# Patient Record
Sex: Female | Born: 1960 | Race: White | Hispanic: No | Smoking: Current every day smoker
Health system: Southern US, Community
[De-identification: ages and names within clinical notes are randomized; demographics above are authoritative.]

## PROBLEM LIST (undated history)

## (undated) DIAGNOSIS — E785 Hyperlipidemia, unspecified: Secondary | ICD-10-CM

## (undated) DIAGNOSIS — F329 Major depressive disorder, single episode, unspecified: Secondary | ICD-10-CM

## (undated) DIAGNOSIS — I2510 Atherosclerotic heart disease of native coronary artery without angina pectoris: Secondary | ICD-10-CM

## (undated) DIAGNOSIS — I219 Acute myocardial infarction, unspecified: Secondary | ICD-10-CM

## (undated) DIAGNOSIS — F32A Depression, unspecified: Secondary | ICD-10-CM

## (undated) DIAGNOSIS — M1990 Unspecified osteoarthritis, unspecified site: Secondary | ICD-10-CM

## (undated) DIAGNOSIS — L0231 Cutaneous abscess of buttock: Secondary | ICD-10-CM

## (undated) DIAGNOSIS — I10 Essential (primary) hypertension: Secondary | ICD-10-CM

## (undated) HISTORY — DX: Cutaneous abscess of buttock: L02.31

## (undated) HISTORY — DX: Atherosclerotic heart disease of native coronary artery without angina pectoris: I25.10

## (undated) HISTORY — DX: Depression, unspecified: F32.A

## (undated) HISTORY — DX: Major depressive disorder, single episode, unspecified: F32.9

## (undated) HISTORY — PX: RECTAL SURGERY: SHX760

## (undated) HISTORY — DX: Essential (primary) hypertension: I10

## (undated) HISTORY — DX: Acute myocardial infarction, unspecified: I21.9

## (undated) HISTORY — DX: Hyperlipidemia, unspecified: E78.5

## (undated) HISTORY — DX: Unspecified osteoarthritis, unspecified site: M19.90

## (undated) HISTORY — PX: CORONARY ANGIOPLASTY: SHX604

---

## 2005-06-05 ENCOUNTER — Emergency Department: Payer: Self-pay | Admitting: Emergency Medicine

## 2005-06-09 ENCOUNTER — Ambulatory Visit: Payer: Self-pay | Admitting: General Surgery

## 2007-07-11 ENCOUNTER — Ambulatory Visit: Payer: Self-pay | Admitting: Gastroenterology

## 2010-10-20 ENCOUNTER — Ambulatory Visit: Payer: Self-pay | Admitting: Unknown Physician Specialty

## 2012-11-17 ENCOUNTER — Emergency Department: Payer: Self-pay | Admitting: Emergency Medicine

## 2013-03-09 DIAGNOSIS — I219 Acute myocardial infarction, unspecified: Secondary | ICD-10-CM

## 2013-03-09 HISTORY — DX: Acute myocardial infarction, unspecified: I21.9

## 2013-03-29 ENCOUNTER — Emergency Department: Payer: Self-pay | Admitting: Emergency Medicine

## 2013-03-29 LAB — CK TOTAL AND CKMB (NOT AT ARMC): CK-MB: 2.9 ng/mL (ref 0.5–3.6)

## 2013-03-29 LAB — BASIC METABOLIC PANEL
Anion Gap: 6 — ABNORMAL LOW (ref 7–16)
BUN: 12 mg/dL (ref 7–18)
Creatinine: 0.93 mg/dL (ref 0.60–1.30)
EGFR (Non-African Amer.): >60
Potassium: 3.7 mmol/L (ref 3.5–5.1)
Sodium: 142 mmol/L (ref 136–145)

## 2013-03-29 LAB — TROPONIN I: Troponin-I: 0.35 ng/mL — ABNORMAL HIGH

## 2013-03-29 LAB — CBC
HCT: 46.2 % (ref 35.0–47.0)
MCH: 34.1 pg — ABNORMAL HIGH (ref 26.0–34.0)
MCHC: 34.5 g/dL (ref 32.0–36.0)
MCV: 99 fL (ref 80–100)
Platelet: 247 10*3/uL (ref 150–440)
RBC: 4.69 10*6/uL (ref 3.80–5.20)
RDW: 13.2 % (ref 11.5–14.5)
WBC: 10.1 10*3/uL (ref 3.6–11.0)

## 2013-03-29 LAB — PROTIME-INR: INR: 0.9

## 2013-03-29 LAB — APTT: Activated PTT: 25.2 secs (ref 23.6–35.9)

## 2013-03-30 HISTORY — PX: CARDIAC SURGERY: SHX584

## 2013-04-02 DIAGNOSIS — I213 ST elevation (STEMI) myocardial infarction of unspecified site: Secondary | ICD-10-CM | POA: Diagnosis present

## 2013-05-29 ENCOUNTER — Ambulatory Visit: Payer: Self-pay

## 2014-05-29 DIAGNOSIS — E782 Mixed hyperlipidemia: Secondary | ICD-10-CM | POA: Insufficient documentation

## 2014-05-29 DIAGNOSIS — I10 Essential (primary) hypertension: Secondary | ICD-10-CM | POA: Insufficient documentation

## 2014-05-29 DIAGNOSIS — I2510 Atherosclerotic heart disease of native coronary artery without angina pectoris: Secondary | ICD-10-CM | POA: Insufficient documentation

## 2014-07-02 ENCOUNTER — Ambulatory Visit: Payer: Self-pay | Admitting: Surgery

## 2014-07-02 LAB — CBC WITH DIFFERENTIAL/PLATELET
BASOS ABS: 0.1 10*3/uL (ref 0.0–0.1)
BASOS PCT: 0.8 %
EOS ABS: 0.2 10*3/uL (ref 0.0–0.7)
Eosinophil %: 2.3 %
HCT: 43.5 % (ref 35.0–47.0)
HGB: 14.6 g/dL (ref 12.0–16.0)
Lymphocyte #: 2.1 10*3/uL (ref 1.0–3.6)
Lymphocyte %: 28 %
MCH: 34 pg (ref 26.0–34.0)
MCHC: 33.5 g/dL (ref 32.0–36.0)
MCV: 102 fL — ABNORMAL HIGH (ref 80–100)
MONO ABS: 0.5 x10 3/mm (ref 0.2–0.9)
Monocyte %: 6.3 %
NEUTROS ABS: 4.8 10*3/uL (ref 1.4–6.5)
NEUTROS PCT: 62.6 %
PLATELETS: 203 10*3/uL (ref 150–440)
RBC: 4.28 10*6/uL (ref 3.80–5.20)
RDW: 12.4 % (ref 11.5–14.5)
WBC: 7.7 10*3/uL (ref 3.6–11.0)

## 2014-07-10 ENCOUNTER — Ambulatory Visit: Payer: Self-pay | Admitting: Surgery

## 2014-10-11 DIAGNOSIS — K603 Anal fistula, unspecified: Secondary | ICD-10-CM | POA: Insufficient documentation

## 2015-01-30 NOTE — Op Note (Signed)
PATIENT NAME:  Sylvia Wiggins, Sylvia Wiggins MR#:  [id number] DATE OF BIRTH:  [birth year]  DATE OF PROCEDURE:  07/10/2014  PREOPERATIVE DIAGNOSIS: Anal fistula.  POSTOPERATIVE DIAGNOSIS: Anal fistula.  PROCEDURE: Rectal exam under anesthesia.  SURGEON: Tiney Rougealph Ely, MD  ANESTHESIA: General.  OPERATIVE PROCEDURE: With the patient in the supine position, after induction of appropriate general anesthesia, the patient was placed in the lithotomy position, appropriately padded and positioned. The perineal area was examined. At approximately the 3 o'clock position, on the left side, in lithotomy, there was a small nipple which was felt to be the external opening of the fistula. This area was probed gently and led approximately 4 cm toward the anal cavity. I manipulated the rectum both manually and with the bivalve retractor. We could not identify an internal opening. The probe did appear to track above the sphincter. I injected the external opening with some hydrogen peroxide in an effort to identify the internal tract if it were quite small but could not even with relatively forceful injection identify the internal opening. I decided at that point to abandoned the procedure because I had told her prior to surgery that we would not approach her sphincter if we could not identify an internal opening below the sphincter. We injected the perianal area with 0.25% Marcaine for postoperative pain control and the anus was dilated to 2 fingerbreadths. Sterile dressings were applied. The patient was returned to the recovery room having tolerated the procedure well. Sponge and needle counts were correct x2 in the operating room.  ____________________________ Quentin Orealph L. Ely III, MD rle:sb D: 07/10/2014 10:43:28 ET T: 07/10/2014 13:01:42 ET JOB#: [id number]  cc: Carmie Endalph L. Ely III, MD, <Dictator> Phillips Odorheryl A. Jamesetta OrleansWicker, NP Quentin OreALPH L ELY MD ELECTRONICALLY SIGNED 07/10/2014 17:54

## 2015-10-05 ENCOUNTER — Encounter: Payer: Self-pay | Admitting: Family Medicine

## 2015-10-05 ENCOUNTER — Ambulatory Visit (INDEPENDENT_AMBULATORY_CARE_PROVIDER_SITE_OTHER): Payer: Medicaid Other | Admitting: Family Medicine

## 2015-10-05 VITALS — BP 108/70 | HR 84 | Temp 97.8°F | Resp 15 | Ht 61.0 in | Wt 165.7 lb

## 2015-10-05 DIAGNOSIS — F321 Major depressive disorder, single episode, moderate: Secondary | ICD-10-CM

## 2015-10-05 DIAGNOSIS — F329 Major depressive disorder, single episode, unspecified: Secondary | ICD-10-CM | POA: Insufficient documentation

## 2015-10-05 MED ORDER — VENLAFAXINE HCL ER 75 MG PO CP24: 75.0000 mg | ORAL_CAPSULE | Freq: Every day | ORAL | Status: DC

## 2015-10-05 NOTE — Progress Notes (Signed)
Name: Sylvia Wiggins   MRN: [id number]    DOB: Aug 20, 1961   Date:10/05/2015       Progress Note  Subjective  Chief Complaint  Chief Complaint  Patient presents with  . Establish Care    NP  . Hyperlipidemia    Depression        This is a recurrent problem.  The onset quality is gradual.   The most recent episode lasted 2 months.  The problem is unchanged.  Associated symptoms include fatigue, helplessness, hopelessness, irritable and sad.  Associated symptoms include no decreased concentration and no appetite change.  Past treatments include SSRIs - Selective serotonin reuptake inhibitors (Started on Citalopram by Sylvia Wiggins for menopausal symptoms. Not helping with symptoms of depression.).  Compliance with treatment is good.   Pt. Is here to establish care.   Past Medical History  Diagnosis Date  . Hyperlipidemia   . Arthritis   . Hypertension   . Depression   . Myocardial infarction Sylvia [facility]ins) June 2014  . CAD (coronary artery disease)     Stents in Clogged artery at [facility] June 2014  . Abscess, gluteal, left     Recurrent drainage, followed by [name].    Past Surgical History  Procedure Laterality Date  . Rectal surgery  Anal Fissure    Family History  Problem Relation Age of Onset  . Cancer Mother     colon  . Heart disease Father     Died of Heart Attack    Social History   Social History  . Marital Status: Married    Spouse Name: N/A  . Number of Children: N/A  . Years of Education: N/A   Occupational History  . Not on file.   Social History Main Topics  . Smoking status: Current Every Day Smoker -- 0.25 packs/day    Types: Cigarettes  . Smokeless tobacco: Never Used  . Alcohol Use: No  . Drug Use: No  . Sexual Activity: No   Other Topics Concern  . Not on file   Social History Narrative  . No narrative on file     Current outpatient prescriptions:  .  citalopram (CELEXA) 20 MG tablet, Take by mouth., Disp: , Rfl:  .  metoprolol tartrate  (LOPRESSOR) 25 MG tablet, Take by mouth., Disp: , Rfl:  .  senna-docusate (SENOKOT-S) 8.6-50 MG tablet, Take by mouth., Disp: , Rfl:  .  simvastatin (ZOCOR) 40 MG tablet, Take 40 mg by mouth daily., Disp: , Rfl:  .  venlafaxine XR (EFFEXOR-XR) 75 MG 24 hr capsule, Take 1 capsule (75 mg total) by mouth daily with breakfast., Disp: 30 capsule, Rfl: 2  Allergies  Allergen Reactions  . Penicillins Nausea Only    Review of Systems  Constitutional: Positive for fatigue. Negative for appetite change.  Psychiatric/Behavioral: Positive for depression. Negative for decreased concentration.    Objective  Filed Vitals:   10/05/15 1143  BP: 108/70  Pulse: 84  Temp: 97.8 F (36.6 C)  TempSrc: Oral  Resp: 15  Height:  (1.549 m)  Weight: 165 lb 11.2 oz (75.161 kg)  SpO2: 98%    Physical Exam  Constitutional: She is oriented to person, place, and time and well-developed, well-nourished, and in no distress. She is irritable.  HENT:  Head: Normocephalic and atraumatic.  Cardiovascular: Normal rate, regular rhythm and normal heart sounds.   No murmur heard. Pulmonary/Chest: Effort normal and breath sounds normal.  Neurological: She is alert and oriented to person, place,  and time.  Psychiatric: Mood, memory, affect and judgment normal.  Nursing note and vitals reviewed.   Assessment & Plan  1. Moderate single current episode of major depressive disorder (HCC) DC citalopram and start on Effexor XR 75 mg for treatment of major depression and vasomotor symptoms of menopause. - venlafaxine XR (EFFEXOR-XR) 75 MG 24 hr capsule; Take 1 capsule (75 mg total) by mouth daily with breakfast.  Dispense: 30 capsule; Refill: 2   [name] Asad A. Faylene KurtzShah [facility] 10/05/2015 8:11 PM

## 2016-01-10 ENCOUNTER — Ambulatory Visit
Admission: RE | Admit: 2016-01-10 | Discharge: 2016-01-10 | Disposition: A | Discharge status: Home / Self Care | Payer: Disability Insurance | Source: Ambulatory Visit | Attending: Thoracic Surgery | Admitting: Thoracic Surgery

## 2016-01-10 ENCOUNTER — Ambulatory Visit
Admission: RE | Admit: 2016-01-10 | Discharge: 2016-01-10 | Disposition: A | Discharge status: Home / Self Care | Payer: Disability Insurance | Source: Ambulatory Visit | Attending: *Deleted | Admitting: *Deleted

## 2016-01-10 ENCOUNTER — Other Ambulatory Visit: Payer: Self-pay | Admitting: *Deleted

## 2016-01-10 DIAGNOSIS — I700 Atherosclerosis of aorta: Secondary | ICD-10-CM | POA: Diagnosis not present

## 2016-01-10 DIAGNOSIS — G5700 Lesion of sciatic nerve, unspecified lower limb: Secondary | ICD-10-CM | POA: Insufficient documentation

## 2016-01-26 ENCOUNTER — Ambulatory Visit (INDEPENDENT_AMBULATORY_CARE_PROVIDER_SITE_OTHER): Payer: Medicaid Other | Admitting: Family Medicine

## 2016-01-26 ENCOUNTER — Encounter: Payer: Self-pay | Admitting: Family Medicine

## 2016-01-26 VITALS — BP 108/80 | HR 87 | Temp 97.9°F | Resp 17 | Ht 61.0 in | Wt 165.3 lb

## 2016-01-26 DIAGNOSIS — F329 Major depressive disorder, single episode, unspecified: Secondary | ICD-10-CM | POA: Diagnosis not present

## 2016-01-26 DIAGNOSIS — I10 Essential (primary) hypertension: Secondary | ICD-10-CM | POA: Diagnosis not present

## 2016-01-26 DIAGNOSIS — I2510 Atherosclerotic heart disease of native coronary artery without angina pectoris: Secondary | ICD-10-CM | POA: Diagnosis not present

## 2016-01-26 DIAGNOSIS — G8929 Other chronic pain: Secondary | ICD-10-CM | POA: Diagnosis not present

## 2016-01-26 DIAGNOSIS — E785 Hyperlipidemia, unspecified: Secondary | ICD-10-CM

## 2016-01-26 DIAGNOSIS — M5441 Lumbago with sciatica, right side: Secondary | ICD-10-CM

## 2016-01-26 DIAGNOSIS — F32A Depression, unspecified: Secondary | ICD-10-CM | POA: Insufficient documentation

## 2016-01-26 DIAGNOSIS — R251 Tremor, unspecified: Secondary | ICD-10-CM | POA: Insufficient documentation

## 2016-01-26 MED ORDER — METOPROLOL TARTRATE 25 MG PO TABS: 12.5000 mg | ORAL_TABLET | Freq: Two times a day (BID) | ORAL | Status: DC

## 2016-01-26 MED ORDER — SIMVASTATIN 40 MG PO TABS
40.0000 mg | ORAL_TABLET | Freq: Every day | ORAL | Status: DC
Start: 2016-01-26 — End: 2020-02-17

## 2016-01-26 MED ORDER — VENLAFAXINE HCL ER 75 MG PO CP24: 75.0000 mg | ORAL_CAPSULE | Freq: Every day | ORAL | Status: DC

## 2016-01-26 NOTE — Progress Notes (Signed)
Name: Sylvia Wiggins   MRN: [id number]    DOB: February 01, 1961   Date:01/26/2016       Progress Note  Subjective  Chief Complaint  Chief Complaint  Patient presents with  . Referral    neuro / ortho     Depression        This is a chronic problem.  The onset quality is gradual.   The problem has been gradually improving since onset.  Associated symptoms include fatigue.  Associated symptoms include no hopelessness, does not have insomnia, no myalgias, no headaches, not sad and no suicidal ideas.  Past treatments include SNRIs - Serotonin and norepinephrine reuptake inhibitors. Hypertension This is a chronic problem. The problem is controlled. Pertinent negatives include no chest pain, headaches, palpitations or shortness of breath. Past treatments include beta blockers.  Hyperlipidemia This is a chronic problem. Pertinent negatives include no chest pain, myalgias or shortness of breath. Current antihyperlipidemic treatment includes statins.    Pt. Is requesting referral to Neurology for evaluation of shakes in her hands. Noticed her hands starting to shake and jerk involuntarily and getting worse over the years. These shakes have no association to activity.   In addition, she is requesting referral to Orthopedics for low back pain and right knee pain. She is trying to obtain SSI and was referred to Dr. Maryellen Pile for determination. X ray of L-Spine revealed stable diffuse multilevel degenerative changes with 6 mm anterolisthesis L3 on L4. She reports pain in her right lower back which radiates down her right leg.    Past Medical History  Diagnosis Date  . Hyperlipidemia   . Arthritis   . Hypertension   . Depression   . Myocardial infarction [facility]) June 2014  . CAD (coronary artery disease)     Stents in Clogged artery at [facility] June 2014  . Abscess, gluteal, left     Recurrent drainage, followed by [facility].    Past Surgical History  Procedure Laterality Date  . Rectal surgery  Anal Fissure     Family History  Problem Relation Age of Onset  . Cancer Mother     colon  . Heart disease Father     Died of Heart Attack    Social History   Social History  . Marital Status: Married    Spouse Name: N/A  . Number of Children: N/A  . Years of Education: N/A   Occupational History  . Not on file.   Social History Main Topics  . Smoking status: Current Every Day Smoker -- 0.25 packs/day    Types: Cigarettes  . Smokeless tobacco: Never Used  . Alcohol Use: No  . Drug Use: No  . Sexual Activity: No   Other Topics Concern  . Not on file   Social History Narrative     Current outpatient prescriptions:  .  aspirin EC 81 MG tablet, Take by mouth., Disp: , Rfl:  .  metoprolol tartrate (LOPRESSOR) 25 MG tablet, Take by mouth., Disp: , Rfl:  .  senna-docusate (SENOKOT-S) 8.6-50 MG tablet, Take by mouth., Disp: , Rfl:  .  simvastatin (ZOCOR) 40 MG tablet, Take 40 mg by mouth daily., Disp: , Rfl:  .  venlafaxine XR (EFFEXOR-XR) 75 MG 24 hr capsule, Take 1 capsule (75 mg total) by mouth daily with breakfast., Disp: 30 capsule, Rfl: 2  Allergies  Allergen Reactions  . Penicillins Nausea Only     Review of Systems  Constitutional: Positive for fatigue.  Respiratory: Negative for shortness  of breath.   Cardiovascular: Negative for chest pain and palpitations.  Musculoskeletal: Negative for myalgias.  Neurological: Negative for headaches.  Psychiatric/Behavioral: Positive for depression. Negative for suicidal ideas. The patient does not have insomnia.     Objective  Filed Vitals:   01/26/16 0847  BP: 108/80  Pulse: 87  Temp: 97.9 F (36.6 C)  TempSrc: Oral  Resp: 17  Height: 5\' 1"  (1.549 m)  Weight: 165 lb 4.8 oz (74.98 kg)  SpO2: 97%    Physical Exam  Constitutional: She is oriented to person, place, and time and well-developed, well-nourished, and in no distress.  HENT:  Head: Normocephalic and atraumatic.  Cardiovascular: Normal rate and regular  rhythm.   Pulmonary/Chest: Effort normal and breath sounds normal.  Abdominal: Soft. Bowel sounds are normal.  Musculoskeletal:       Lumbar back: She exhibits tenderness, pain and spasm.  Neurological: She is alert and oriented to person, place, and time. She displays no tremor.  Psychiatric: Mood, memory, affect and judgment normal.  Nursing note and vitals reviewed.     Assessment & Plan  1. Coronary artery disease involving native coronary artery of native heart without angina pectoris Reviewed cardiology records, patient is on metoprolol 12.5 mg twice daily. Advised to continue following up with Dr. Gwen PoundsKowalski. Refills provided - metoprolol tartrate (LOPRESSOR) 25 MG tablet; Take 0.5 tablets (12.5 mg total) by mouth 2 (two) times daily.  Dispense: 60 tablet; Refill: 0  2. Essential hypertension  - metoprolol tartrate (LOPRESSOR) 25 MG tablet; Take 0.5 tablets (12.5 mg total) by mouth 2 (two) times daily.  Dispense: 60 tablet; Refill: 0  3. Depression Stable and responsive to SNRI therapy. Refills provided - venlafaxine XR (EFFEXOR-XR) 75 MG 24 hr capsule; Take 1 capsule (75 mg total) by mouth daily with breakfast.  Dispense: 30 capsule; Refill: 2  4. Hyperlipidemia  - Lipid Profile - Comprehensive Metabolic Panel (CMET) - simvastatin (ZOCOR) 40 MG tablet; Take 1 tablet (40 mg total) by mouth daily at 6 PM.  Dispense: 30 tablet; Refill: 2  5. Tremor of both hands No tremor observed during today's office visit, we'll refer to neurology - Ambulatory referral to Neurology  6. Chronic right-sided low back pain with right-sided sciatica  - Ambulatory referral to Orthopedic Surgery   Hector Venne Asad A. Faylene KurtzShah [facility] 01/26/2016 9:14 AM

## 2016-01-27 LAB — COMPREHENSIVE METABOLIC PANEL
ALT: 25 IU/L (ref 0–32)
AST: 17 IU/L (ref 0–40)
Albumin/Globulin Ratio: 1.7 (ref 1.2–2.2)
Albumin: 4.3 g/dL (ref 3.5–5.5)
Alkaline Phosphatase: 105 IU/L (ref 39–117)
BUN/Creatinine Ratio: 16 (ref 9–23)
BUN: 13 mg/dL (ref 6–24)
Bilirubin Total: 0.4 mg/dL (ref 0.0–1.2)
CALCIUM: 9.8 mg/dL (ref 8.7–10.2)
CO2: 22 mmol/L (ref 18–29)
CREATININE: 0.82 mg/dL (ref 0.57–1.00)
Chloride: 102 mmol/L (ref 96–106)
GFR calc Af Amer: 93 mL/min/{1.73_m2} (ref >59)
GFR, EST NON AFRICAN AMERICAN: 80 mL/min/{1.73_m2} (ref >59)
GLOBULIN, TOTAL: 2.5 g/dL (ref 1.5–4.5)
Glucose: 140 mg/dL — ABNORMAL HIGH (ref 65–99)
Potassium: 5.2 mmol/L (ref 3.5–5.2)
Sodium: 142 mmol/L (ref 134–144)
Total Protein: 6.8 g/dL (ref 6.0–8.5)

## 2016-01-27 LAB — LIPID PANEL
CHOL/HDL RATIO: 7.2 ratio — AB (ref 0.0–4.4)
CHOLESTEROL TOTAL: 279 mg/dL — AB (ref 100–199)
HDL: 39 mg/dL — ABNORMAL LOW (ref >39)
LDL CALC: 181 mg/dL — AB (ref 0–99)
Triglycerides: 296 mg/dL — ABNORMAL HIGH (ref 0–149)
VLDL Cholesterol Cal: 59 mg/dL — ABNORMAL HIGH (ref 5–40)

## 2016-01-31 ENCOUNTER — Other Ambulatory Visit: Payer: Self-pay

## 2016-02-10 ENCOUNTER — Telehealth: Payer: Self-pay | Admitting: Family Medicine

## 2016-02-10 NOTE — Telephone Encounter (Signed)
[facility]

## 2016-02-10 NOTE — Telephone Encounter (Signed)
Left message to inform pt

## 2016-02-10 NOTE — Telephone Encounter (Signed)
[facility]

## 2016-03-24 ENCOUNTER — Other Ambulatory Visit: Payer: Self-pay | Admitting: Orthopedic Surgery

## 2016-03-24 DIAGNOSIS — M4316 Spondylolisthesis, lumbar region: Secondary | ICD-10-CM

## 2016-03-24 DIAGNOSIS — M5416 Radiculopathy, lumbar region: Secondary | ICD-10-CM

## 2016-03-24 DIAGNOSIS — M4726 Other spondylosis with radiculopathy, lumbar region: Secondary | ICD-10-CM

## 2016-04-17 ENCOUNTER — Ambulatory Visit
Admission: RE | Admit: 2016-04-17 | Discharge: 2016-04-17 | Disposition: A | Discharge status: Home / Self Care | Payer: Medicaid Other | Source: Ambulatory Visit | Attending: Orthopedic Surgery | Admitting: Orthopedic Surgery

## 2016-04-17 DIAGNOSIS — M4726 Other spondylosis with radiculopathy, lumbar region: Secondary | ICD-10-CM

## 2016-04-17 DIAGNOSIS — M5416 Radiculopathy, lumbar region: Secondary | ICD-10-CM | POA: Diagnosis present

## 2016-04-17 DIAGNOSIS — M4806 Spinal stenosis, lumbar region: Secondary | ICD-10-CM | POA: Insufficient documentation

## 2016-04-17 DIAGNOSIS — M4316 Spondylolisthesis, lumbar region: Secondary | ICD-10-CM | POA: Diagnosis present

## 2016-04-17 DIAGNOSIS — Q068 Other specified congenital malformations of spinal cord: Secondary | ICD-10-CM | POA: Insufficient documentation

## 2016-04-26 ENCOUNTER — Encounter: Payer: Self-pay | Admitting: Family Medicine

## 2016-04-26 ENCOUNTER — Encounter (INDEPENDENT_AMBULATORY_CARE_PROVIDER_SITE_OTHER): Payer: Medicaid Other | Admitting: Family Medicine

## 2016-04-26 NOTE — Progress Notes (Signed)
Name: Sylvia Wiggins   MRN: [id number]    DOB: 11/13/1960   Date:04/26/2016       Progress Note  Subjective  Chief Complaint  Chief Complaint  Patient presents with  . Hypertension    follow up  . Hyperlipidemia    HPI    Past Medical History  Diagnosis Date  . Hyperlipidemia   . Arthritis   . Hypertension   . Depression   . Myocardial infarction [facility](HCC) June 2014  . CAD (coronary artery disease)     [facility]  . Abscess, gluteal, left     Rec[facility]    Past Surgical History  Procedure Laterality Date  . Rectal surgery  Anal Fissure    Family History  Problem Relation Age of Onset  . Cancer Mother     colon  . Heart disease Father     Died of Heart Attack    Social History   Social History  . Marital Status: Married    Spouse Name: N/A  . Number of Children: N/A  . Years of Education: N/A   Occupational History  . Not on file.   Social History Main Topics  . Smoking status: Current Every Day Smoker -- 0.25 packs/day    Types: Cigarettes  . Smokeless tobacco: Never Used  . Alcohol Use: No  . Drug Use: No  . Sexual Activity: No   Other Topics Concern  . Not on file   Social History Narrative     Current outpatient prescriptions:  .  aspirin EC 81 MG tablet, Take by mouth., Disp: , Rfl:  .  gabapentin (NEURONTIN) 300 MG capsule, Take by mouth., Disp: , Rfl:  .  metoprolol tartrate (LOPRESSOR) 25 MG tablet, Take 0.5 tablets (12.5 mg total) by mouth 2 (two) times daily., Disp: 60 tablet, Rfl: 0 .  senna-docusate (SENOKOT-S) 8.6-50 MG tablet, Take by mouth., Disp: , Rfl:  .  simvastatin (ZOCOR) 40 MG tablet, Take 1 tablet (40 mg total) by mouth daily at 6 PM., Disp: 30 tablet, Rfl: 2 .  traMADol (ULTRAM) 50 MG tablet, , Disp: , Rfl:  .  venlafaxine XR (EFFEXOR-XR) 75 MG 24 hr capsule, Take 1 capsule (75 mg total) by mouth daily with breakfast., Disp: 30 capsule, Rfl: 2  Allergies   Allergen Reactions  . Penicillins Nausea Only     Review of Systems  This encounter was created in error - please disregard.    Objective  Filed Vitals:   04/26/16 0903  BP: 124/82  Pulse: 75  Temp: 98.9 F (37.2 C)  TempSrc: Oral  Resp: 18  Height: 5\' 1"  (1.549 m)  Weight: 158 lb 4.8 oz (71.804 kg)  SpO2: 96%    Physical Exam     No results found for this or any previous visit (from the past 2160 hour(s)).   Assessment & Plan  There are no diagnoses linked to this encounter.  Chesnee Floren Asad A. Faylene KurtzShah [facility] 04/26/2016 9:33 AM

## 2016-05-10 DIAGNOSIS — Q068 Other specified congenital malformations of spinal cord: Secondary | ICD-10-CM | POA: Insufficient documentation

## 2016-05-15 ENCOUNTER — Other Ambulatory Visit: Payer: Self-pay | Admitting: Neurology

## 2016-05-15 DIAGNOSIS — R251 Tremor, unspecified: Secondary | ICD-10-CM

## 2016-05-15 DIAGNOSIS — R42 Dizziness and giddiness: Secondary | ICD-10-CM

## 2016-05-26 ENCOUNTER — Ambulatory Visit: Admission: RE | Admit: 2016-05-26 | Payer: Disability Insurance | Source: Ambulatory Visit

## 2016-06-02 ENCOUNTER — Ambulatory Visit
Admission: RE | Admit: 2016-06-02 | Discharge: 2016-06-02 | Disposition: A | Discharge status: Home / Self Care | Payer: Medicaid Other | Source: Ambulatory Visit | Attending: Neurology | Admitting: Neurology

## 2016-06-02 ENCOUNTER — Other Ambulatory Visit
Admission: RE | Admit: 2016-06-02 | Discharge: 2016-06-02 | Disposition: A | Discharge status: Home / Self Care | Payer: Medicaid Other | Source: Ambulatory Visit | Attending: Neurology | Admitting: Neurology

## 2016-06-02 DIAGNOSIS — R42 Dizziness and giddiness: Secondary | ICD-10-CM | POA: Diagnosis present

## 2016-06-02 DIAGNOSIS — Z79899 Other long term (current) drug therapy: Secondary | ICD-10-CM | POA: Diagnosis present

## 2016-06-02 DIAGNOSIS — R251 Tremor, unspecified: Secondary | ICD-10-CM | POA: Diagnosis not present

## 2016-06-02 LAB — CREATININE, SERUM: Creatinine, Ser: 0.86 mg/dL (ref 0.44–1.00)

## 2016-06-02 MED ORDER — GADOBENATE DIMEGLUMINE 529 MG/ML IV SOLN
14.0000 mL | Freq: Once | INTRAVENOUS | Status: AC | PRN
  Administered 2016-06-02: 14 mL via INTRAVENOUS

## 2016-06-30 ENCOUNTER — Other Ambulatory Visit: Payer: Self-pay | Admitting: Neurosurgery

## 2016-06-30 DIAGNOSIS — Q068 Other specified congenital malformations of spinal cord: Secondary | ICD-10-CM

## 2016-07-13 ENCOUNTER — Ambulatory Visit: Payer: Medicaid Other

## 2016-07-13 ENCOUNTER — Other Ambulatory Visit: Payer: Medicaid Other

## 2016-07-26 ENCOUNTER — Ambulatory Visit
Admission: RE | Admit: 2016-07-26 | Discharge: 2016-07-26 | Disposition: A | Discharge status: Home / Self Care | Payer: Medicaid Other | Source: Ambulatory Visit | Attending: Neurosurgery | Admitting: Neurosurgery

## 2016-07-26 DIAGNOSIS — Q068 Other specified congenital malformations of spinal cord: Secondary | ICD-10-CM | POA: Insufficient documentation

## 2016-07-26 DIAGNOSIS — M50222 Other cervical disc displacement at C5-C6 level: Secondary | ICD-10-CM | POA: Insufficient documentation

## 2016-07-26 DIAGNOSIS — M8938 Hypertrophy of bone, other site: Secondary | ICD-10-CM | POA: Diagnosis not present

## 2016-08-03 ENCOUNTER — Telehealth: Payer: Self-pay | Admitting: Family Medicine

## 2016-08-03 NOTE — Telephone Encounter (Signed)
[facility]. States patient is having surgery on 08/21/16 by Dr Marcell BarlowYarborough and he is wanting them to do EKG. Please give call if you have any questions [phone]

## 2016-08-03 NOTE — Telephone Encounter (Signed)
If patient needs preoperative clearance, she will need an appointment for an office visit and we can do an EKG among other pertinent lab work

## 2016-08-23 DIAGNOSIS — I252 Old myocardial infarction: Secondary | ICD-10-CM | POA: Insufficient documentation

## 2016-08-29 ENCOUNTER — Encounter: Payer: Self-pay | Admitting: Emergency Medicine

## 2016-08-29 ENCOUNTER — Inpatient Hospital Stay
Admission: EM | Admit: 2016-08-29 | Discharge: 2016-08-31 | DRG: 247 | Disposition: A | Discharge status: Home / Self Care | Payer: Medicaid Other | Attending: Internal Medicine | Admitting: Internal Medicine

## 2016-08-29 ENCOUNTER — Emergency Department: Payer: Medicaid Other

## 2016-08-29 DIAGNOSIS — Z79899 Other long term (current) drug therapy: Secondary | ICD-10-CM

## 2016-08-29 DIAGNOSIS — Z8249 Family history of ischemic heart disease and other diseases of the circulatory system: Secondary | ICD-10-CM

## 2016-08-29 DIAGNOSIS — R079 Chest pain, unspecified: Secondary | ICD-10-CM

## 2016-08-29 DIAGNOSIS — F17210 Nicotine dependence, cigarettes, uncomplicated: Secondary | ICD-10-CM | POA: Diagnosis present

## 2016-08-29 DIAGNOSIS — I10 Essential (primary) hypertension: Secondary | ICD-10-CM | POA: Diagnosis present

## 2016-08-29 DIAGNOSIS — Z880 Allergy status to penicillin: Secondary | ICD-10-CM

## 2016-08-29 DIAGNOSIS — R739 Hyperglycemia, unspecified: Secondary | ICD-10-CM | POA: Diagnosis present

## 2016-08-29 DIAGNOSIS — I252 Old myocardial infarction: Secondary | ICD-10-CM

## 2016-08-29 DIAGNOSIS — F329 Major depressive disorder, single episode, unspecified: Secondary | ICD-10-CM | POA: Diagnosis present

## 2016-08-29 DIAGNOSIS — I214 Non-ST elevation (NSTEMI) myocardial infarction: Principal | ICD-10-CM | POA: Diagnosis present

## 2016-08-29 DIAGNOSIS — M546 Pain in thoracic spine: Secondary | ICD-10-CM | POA: Diagnosis present

## 2016-08-29 DIAGNOSIS — I2510 Atherosclerotic heart disease of native coronary artery without angina pectoris: Secondary | ICD-10-CM | POA: Diagnosis present

## 2016-08-29 DIAGNOSIS — Z955 Presence of coronary angioplasty implant and graft: Secondary | ICD-10-CM

## 2016-08-29 DIAGNOSIS — E785 Hyperlipidemia, unspecified: Secondary | ICD-10-CM | POA: Diagnosis present

## 2016-08-29 DIAGNOSIS — Z7982 Long term (current) use of aspirin: Secondary | ICD-10-CM

## 2016-08-29 LAB — COMPREHENSIVE METABOLIC PANEL
ALK PHOS: 90 U/L (ref 38–126)
ALT: 32 U/L (ref 14–54)
ANION GAP: 8 (ref 5–15)
AST: 33 U/L (ref 15–41)
Albumin: 4 g/dL (ref 3.5–5.0)
BUN: 11 mg/dL (ref 6–20)
CALCIUM: 9.7 mg/dL (ref 8.9–10.3)
CHLORIDE: 104 mmol/L (ref 101–111)
CO2: 27 mmol/L (ref 22–32)
Creatinine, Ser: 0.94 mg/dL (ref 0.44–1.00)
GFR calc non Af Amer: >60 mL/min (ref >60)
Glucose, Bld: 299 mg/dL — ABNORMAL HIGH (ref 65–99)
Potassium: 4.3 mmol/L (ref 3.5–5.1)
SODIUM: 139 mmol/L (ref 135–145)
Total Bilirubin: 0.6 mg/dL (ref 0.3–1.2)
Total Protein: 7.2 g/dL (ref 6.5–8.1)

## 2016-08-29 LAB — CBC WITH DIFFERENTIAL/PLATELET
Basophils Absolute: 0.1 10*3/uL (ref 0–0.1)
Basophils Relative: 1 %
EOS ABS: 0.2 10*3/uL (ref 0–0.7)
EOS PCT: 2 %
HCT: 43.5 % (ref 35.0–47.0)
Hemoglobin: 15.3 g/dL (ref 12.0–16.0)
LYMPHS ABS: 2.2 10*3/uL (ref 1.0–3.6)
Lymphocytes Relative: 27 %
MCH: 34.7 pg — AB (ref 26.0–34.0)
MCHC: 35.1 g/dL (ref 32.0–36.0)
MCV: 98.8 fL (ref 80.0–100.0)
MONO ABS: 0.4 10*3/uL (ref 0.2–0.9)
MONOS PCT: 5 %
Neutro Abs: 5.3 10*3/uL (ref 1.4–6.5)
Neutrophils Relative %: 65 %
PLATELETS: 213 10*3/uL (ref 150–440)
RBC: 4.41 MIL/uL (ref 3.80–5.20)
RDW: 12.8 % (ref 11.5–14.5)
WBC: 8.1 10*3/uL (ref 3.6–11.0)

## 2016-08-29 LAB — TROPONIN I: Troponin I: <0.03 ng/mL (ref <0.03)

## 2016-08-29 MED ORDER — ONDANSETRON HCL 4 MG/2ML IJ SOLN
4.0000 mg | Freq: Once | INTRAMUSCULAR | Status: AC
  Administered 2016-08-29: 4 mg via INTRAVENOUS
  Filled 2016-08-29: qty 2

## 2016-08-29 MED ORDER — MORPHINE SULFATE (PF) 4 MG/ML IV SOLN
4.0000 mg | Freq: Once | INTRAVENOUS | Status: AC
  Administered 2016-08-29: 4 mg via INTRAVENOUS
  Filled 2016-08-29: qty 1

## 2016-08-29 NOTE — ED Triage Notes (Signed)
Pt coming from home via EMS with back pain and radiation to right arm and jaw. Pt states the pain started at 7:30 tonight.

## 2016-08-29 NOTE — ED Provider Notes (Signed)
[facility] Emergency Department Provider Note  ____________________________________________   First MD Initiated Contact with Patient 08/29/16 2147     (approximate)  I have reviewed the triage vital signs and the nursing notes.   HISTORY  Chief Complaint Back Pain   HPI Sylvia Wiggins is a 55 y.o. female with a history of CAD with stents in June 2014 on aspirin was presenting with chest pain. Says that the chest pain started at 7:30 and felt like a sharp pain to her back. She also pain that radiated to her jaw with mild chest pain as well. She says this feels exactly like her heart attack where she needed a stent several years ago. She says that her EKG has always been relatively normal but that her labs have shown abnormalities resulting in her having cardiac interventions. She sees Dr. Gwen PoundsKowalski. She had taken 324 mg of aspirin prior to arrival. She says that at its worst the pain was a 10 out of 10 but now after 2 nitroglycerin tablets as a 5 out of 10. She denies the radiation to her jaw or chest at this time at all and says the pain is in her back. Denies any shortness of breath nausea or vomiting or diaphoresis.   Past Medical History:  Diagnosis Date  . Abscess, gluteal, left    Rec[facility]  . Arthritis   . CAD (coronary artery disease)    [facility]  . Depression   . Hyperlipidemia   . Hypertension   . Myocardial infarction June 2014    Patient Active Problem List   Diagnosis Date Noted  . CAD (coronary artery disease) 01/26/2016  . Hypertension 01/26/2016  . Depression 01/26/2016  . Hyperlipidemia 01/26/2016  . Tremor of both hands 01/26/2016  . Chronic right-sided low back pain with right-sided sciatica 01/26/2016  . Major depression 10/05/2015    Past Surgical History:  Procedure Laterality Date  . CARDIAC SURGERY  03/30/2013   stent placement  . RECTAL SURGERY  Anal  Fissure    Prior to Admission medications   Medication Sig Start Date End Date Taking? Authorizing Provider  aspirin EC 81 MG tablet Take 81 mg by mouth daily.    Yes Historical Provider, MD  cholecalciferol (VITAMIN D) 1000 units tablet Take 1,000 Units by mouth daily.   Yes Historical Provider, MD  gabapentin (NEURONTIN) 300 MG capsule Take 300 mg by mouth 3 (three) times daily.    Yes Historical Provider, MD  metoprolol tartrate (LOPRESSOR) 25 MG tablet Take 0.5 tablets (12.5 mg total) by mouth 2 (two) times daily. 01/26/16  Yes Ellyn HackSyed Asad A Shah, MD  simvastatin (ZOCOR) 40 MG tablet Take 1 tablet (40 mg total) by mouth daily at 6 PM. 01/26/16  Yes Ellyn HackSyed Asad A Shah, MD  venlafaxine XR (EFFEXOR-XR) 75 MG 24 hr capsule Take 1 capsule (75 mg total) by mouth daily with breakfast. 01/26/16  Yes Ellyn HackSyed Asad A Shah, MD  vitamin B-12 (CYANOCOBALAMIN) 1000 MCG tablet Take 1,000 mcg by mouth daily.   Yes Historical Provider, MD    Allergies Penicillins  Family History  Problem Relation Age of Onset  . Cancer Mother     colon  . Heart disease Father     Died of Heart Attack    Social History Social History  Substance Use Topics  . Smoking status: Current Every Day Smoker    Packs/day: 0.25  Types: Cigarettes  . Smokeless tobacco: Never Used  . Alcohol use No    Review of Systems Constitutional: No fever/chills Eyes: No visual changes. ENT: No sore throat. Cardiovascular: As above Respiratory: Denies shortness of breath. Gastrointestinal: No abdominal pain.  No nausea, no vomiting.  No diarrhea.  No constipation. Genitourinary: Negative for dysuria. Musculoskeletal: Negative for back pain. Skin: Negative for rash. Neurological: Negative for headaches, focal weakness or numbness.  10-point ROS otherwise negative.  ____________________________________________   PHYSICAL EXAM:  VITAL SIGNS: ED Triage Vitals  Enc Vitals Group     BP 08/29/16 2136 122/82     Pulse Rate  08/29/16 2136 66     Resp 08/29/16 2136 16     Temp 08/29/16 2136 97.7 F (36.5 C)     Temp Source 08/29/16 2136 Oral     SpO2 08/29/16 2131 97 %     Weight 08/29/16 2140 150 lb (68 kg)     Height 08/29/16 2140 5\' 1"  (1.549 m)     Head Circumference --      Peak Flow --      Pain Score 08/29/16 2141 5     Pain Loc --      Pain Edu? --      Excl. in GC? --     Constitutional: Alert and oriented. Well appearing and in no acute distress. Eyes: Conjunctivae are normal. PERRL. EOMI. Head: Atraumatic. Nose: No congestion/rhinnorhea. Mouth/Throat: Mucous membranes are moist.   Neck: No stridor.   Cardiovascular: Normal rate, regular rhythm. Grossly normal heart sounds.   Respiratory: Normal respiratory effort.  No retractions. Lungs CTAB. Gastrointestinal: Soft and nontender. No distention. Musculoskeletal: No lower extremity tenderness nor edema.  No joint effusions. Neurologic:  Normal speech and language. No gross focal neurologic deficits are appreciated. Skin:  Skin is warm, dry and intact. No rash noted. Psychiatric: Mood and affect are normal. Speech and behavior are normal.  ____________________________________________   LABS (all labs ordered are listed, but only abnormal results are displayed)  Labs Reviewed  CBC WITH DIFFERENTIAL/PLATELET - Abnormal; Notable for the following:       Result Value   MCH 34.7 (*)    All other components within normal limits  COMPREHENSIVE METABOLIC PANEL - Abnormal; Notable for the following:    Glucose, Bld 299 (*)    All other components within normal limits  TROPONIN I   ____________________________________________  EKG  ED ECG REPORT I, Arelia Longest, the attending physician, personally viewed and interpreted this ECG.   Date: 08/29/2016  EKG Time: 2144  Rate: 70  Rhythm: normal sinus rhythm  Axis: Normal  Intervals:none  ST&T Change: Sloping ST segment in aVR as seen on the precordial leads and also with similar  morphology on EKG on the EMR. No reciprocal depressions. T-wave inversions in V2 and V3 without depression. No definitive ST elevation.  ____________________________________________  RADIOLOGY   ____________________________________________   PROCEDURES  Procedure(s) performed:   Procedures  Critical Care performed:   ____________________________________________   INITIAL IMPRESSION / ASSESSMENT AND PLAN / ED COURSE  Pertinent labs & imaging results that were available during my care of the patient were reviewed by me and considered in my medical decision making (see chart for details).    Clinical Course   ----------------------------------------- 10:59 PM on 08/29/2016 -----------------------------------------  Patient is pain-free after morphine. Reassuring first set of labs. Patient with identical symptoms to previous MI. We'll image the hospital for further cardiac workup. Signed out  to Dr. Emmit PomfretHugelmeyer.  Patient were the plan and willing to comply.   ____________________________________________   FINAL CLINICAL IMPRESSION(S) / ED DIAGNOSES  Chest pain. Back pain.    NEW MEDICATIONS STARTED DURING THIS VISIT:  New Prescriptions   No medications on file     Note:  This document was prepared using Dragon voice recognition software and may include unintentional dictation errors.    Myrna Blazeravid Matthew Angellee Cohill, MD 08/29/16 2300

## 2016-08-30 ENCOUNTER — Encounter
Admission: EM | Disposition: A | Discharge status: Home / Self Care | Payer: Self-pay | Source: Home / Self Care | Attending: Internal Medicine

## 2016-08-30 ENCOUNTER — Encounter: Payer: Self-pay | Admitting: *Deleted

## 2016-08-30 DIAGNOSIS — R079 Chest pain, unspecified: Secondary | ICD-10-CM | POA: Diagnosis present

## 2016-08-30 DIAGNOSIS — Z955 Presence of coronary angioplasty implant and graft: Secondary | ICD-10-CM | POA: Diagnosis not present

## 2016-08-30 DIAGNOSIS — F329 Major depressive disorder, single episode, unspecified: Secondary | ICD-10-CM | POA: Diagnosis present

## 2016-08-30 DIAGNOSIS — I252 Old myocardial infarction: Secondary | ICD-10-CM | POA: Diagnosis not present

## 2016-08-30 DIAGNOSIS — Z880 Allergy status to penicillin: Secondary | ICD-10-CM | POA: Diagnosis not present

## 2016-08-30 DIAGNOSIS — I10 Essential (primary) hypertension: Secondary | ICD-10-CM | POA: Diagnosis present

## 2016-08-30 DIAGNOSIS — I214 Non-ST elevation (NSTEMI) myocardial infarction: Secondary | ICD-10-CM | POA: Diagnosis present

## 2016-08-30 DIAGNOSIS — Z7982 Long term (current) use of aspirin: Secondary | ICD-10-CM | POA: Diagnosis not present

## 2016-08-30 DIAGNOSIS — I2510 Atherosclerotic heart disease of native coronary artery without angina pectoris: Secondary | ICD-10-CM | POA: Diagnosis present

## 2016-08-30 DIAGNOSIS — Z8249 Family history of ischemic heart disease and other diseases of the circulatory system: Secondary | ICD-10-CM | POA: Diagnosis not present

## 2016-08-30 DIAGNOSIS — R739 Hyperglycemia, unspecified: Secondary | ICD-10-CM | POA: Diagnosis present

## 2016-08-30 DIAGNOSIS — M546 Pain in thoracic spine: Secondary | ICD-10-CM | POA: Diagnosis present

## 2016-08-30 DIAGNOSIS — F17210 Nicotine dependence, cigarettes, uncomplicated: Secondary | ICD-10-CM | POA: Diagnosis present

## 2016-08-30 DIAGNOSIS — E785 Hyperlipidemia, unspecified: Secondary | ICD-10-CM | POA: Diagnosis present

## 2016-08-30 DIAGNOSIS — Z79899 Other long term (current) drug therapy: Secondary | ICD-10-CM | POA: Diagnosis not present

## 2016-08-30 HISTORY — PX: CARDIAC CATHETERIZATION: SHX172

## 2016-08-30 LAB — GLUCOSE, CAPILLARY
GLUCOSE-CAPILLARY: 155 mg/dL — AB (ref 65–99)
GLUCOSE-CAPILLARY: 133 mg/dL — AB (ref 65–99)
GLUCOSE-CAPILLARY: 199 mg/dL — AB (ref 65–99)
Glucose-Capillary: 145 mg/dL — ABNORMAL HIGH (ref 65–99)

## 2016-08-30 LAB — CBC
HEMATOCRIT: 44.5 % (ref 35.0–47.0)
Hemoglobin: 15.3 g/dL (ref 12.0–16.0)
MCH: 33.8 pg (ref 26.0–34.0)
MCHC: 34.3 g/dL (ref 32.0–36.0)
MCV: 98.4 fL (ref 80.0–100.0)
PLATELETS: 222 10*3/uL (ref 150–440)
RBC: 4.53 MIL/uL (ref 3.80–5.20)
RDW: 12.8 % (ref 11.5–14.5)
WBC: 8 10*3/uL (ref 3.6–11.0)

## 2016-08-30 LAB — CREATININE, SERUM
Creatinine, Ser: 0.91 mg/dL (ref 0.44–1.00)
GFR calc Af Amer: >60 mL/min (ref >60)
GFR calc non Af Amer: >60 mL/min (ref >60)

## 2016-08-30 LAB — LIPID PANEL
CHOL/HDL RATIO: 5.8 ratio
Cholesterol: 190 mg/dL (ref 0–200)
HDL: 33 mg/dL — AB (ref >40)
LDL CALC: UNDETERMINED mg/dL (ref 0–99)
Triglycerides: 928 mg/dL — ABNORMAL HIGH (ref <150)
VLDL: UNDETERMINED mg/dL (ref 0–40)

## 2016-08-30 LAB — TSH: TSH: 1.888 u[IU]/mL (ref 0.350–4.500)

## 2016-08-30 LAB — TROPONIN I
TROPONIN I: 0.14 ng/mL — AB (ref <0.03)
Troponin I: 0.25 ng/mL (ref <0.03)
Troponin I: 0.46 ng/mL (ref <0.03)

## 2016-08-30 SURGERY — LEFT HEART CATH AND CORONARY ANGIOGRAPHY
Anesthesia: Moderate Sedation

## 2016-08-30 MED ORDER — LABETALOL HCL 5 MG/ML IV SOLN: 10.0000 mg | INTRAVENOUS | Status: AC | PRN

## 2016-08-30 MED ORDER — NITROGLYCERIN 5 MG/ML IV SOLN
INTRAVENOUS | Status: AC
  Filled 2016-08-30: qty 10

## 2016-08-30 MED ORDER — OXYCODONE HCL 5 MG PO TABS: 10.0000 mg | ORAL_TABLET | ORAL | Status: DC | PRN

## 2016-08-30 MED ORDER — CLOPIDOGREL BISULFATE 75 MG PO TABS
75.0000 mg | ORAL_TABLET | Freq: Every day | ORAL | Status: DC
  Administered 2016-08-31: 75 mg via ORAL
  Filled 2016-08-30: qty 1

## 2016-08-30 MED ORDER — SODIUM CHLORIDE 0.9 % IV SOLN
INTRAVENOUS | Status: DC
  Administered 2016-08-30: 02:00:00 via INTRAVENOUS

## 2016-08-30 MED ORDER — SODIUM CHLORIDE 0.9 % IV SOLN
INTRAVENOUS | Status: DC | PRN
  Administered 2016-08-30: 1000 mL via INTRAVENOUS

## 2016-08-30 MED ORDER — ONDANSETRON HCL 4 MG/2ML IJ SOLN: 4.0000 mg | Freq: Four times a day (QID) | INTRAMUSCULAR | Status: DC | PRN

## 2016-08-30 MED ORDER — METOPROLOL TARTRATE 25 MG PO TABS
12.5000 mg | ORAL_TABLET | Freq: Two times a day (BID) | ORAL | Status: DC
  Administered 2016-08-30 – 2016-08-31 (×3): 12.5 mg via ORAL
  Filled 2016-08-30 (×4): qty 1

## 2016-08-30 MED ORDER — VENLAFAXINE HCL ER 75 MG PO CP24
75.0000 mg | ORAL_CAPSULE | Freq: Every day | ORAL | Status: DC
  Administered 2016-08-30 – 2016-08-31 (×2): 75 mg via ORAL
  Filled 2016-08-30 (×2): qty 1

## 2016-08-30 MED ORDER — NITROGLYCERIN 0.4 MG SL SUBL
SUBLINGUAL_TABLET | SUBLINGUAL | Status: AC
  Filled 2016-08-30: qty 1

## 2016-08-30 MED ORDER — CLOPIDOGREL BISULFATE 75 MG PO TABS
ORAL_TABLET | ORAL | Status: DC | PRN
  Administered 2016-08-30: 600 mg via ORAL

## 2016-08-30 MED ORDER — SODIUM CHLORIDE 0.9% FLUSH: 3.0000 mL | Freq: Two times a day (BID) | INTRAVENOUS | Status: DC

## 2016-08-30 MED ORDER — CLOPIDOGREL BISULFATE 75 MG PO TABS
ORAL_TABLET | ORAL | Status: AC
  Filled 2016-08-30: qty 6

## 2016-08-30 MED ORDER — SODIUM CHLORIDE 0.9% FLUSH
3.0000 mL | Freq: Two times a day (BID) | INTRAVENOUS | Status: DC
  Administered 2016-08-30 – 2016-08-31 (×2): 3 mL via INTRAVENOUS

## 2016-08-30 MED ORDER — VITAMIN B-12 1000 MCG PO TABS
1000.0000 ug | ORAL_TABLET | Freq: Every day | ORAL | Status: DC
  Administered 2016-08-30 – 2016-08-31 (×2): 1000 ug via ORAL
  Filled 2016-08-30 (×3): qty 1

## 2016-08-30 MED ORDER — SODIUM CHLORIDE 0.9% FLUSH: 3.0000 mL | INTRAVENOUS | Status: DC | PRN

## 2016-08-30 MED ORDER — SIMVASTATIN 40 MG PO TABS: 40.0000 mg | ORAL_TABLET | Freq: Every day | ORAL | Status: DC

## 2016-08-30 MED ORDER — SODIUM CHLORIDE 0.9 % IV SOLN: 250.0000 mL | INTRAVENOUS | Status: DC | PRN

## 2016-08-30 MED ORDER — IOPAMIDOL (ISOVUE-300) INJECTION 61%
INTRAVENOUS | Status: DC | PRN
  Administered 2016-08-30: 315 mL via INTRA_ARTERIAL

## 2016-08-30 MED ORDER — HEPARIN (PORCINE) IN NACL 2-0.9 UNIT/ML-% IJ SOLN
INTRAMUSCULAR | Status: AC
  Filled 2016-08-30: qty 500

## 2016-08-30 MED ORDER — FENTANYL CITRATE (PF) 100 MCG/2ML IJ SOLN
INTRAMUSCULAR | Status: AC
  Filled 2016-08-30: qty 2

## 2016-08-30 MED ORDER — ASPIRIN 81 MG PO CHEW
CHEWABLE_TABLET | ORAL | Status: AC
  Filled 2016-08-30: qty 4

## 2016-08-30 MED ORDER — ATORVASTATIN CALCIUM 20 MG PO TABS
80.0000 mg | ORAL_TABLET | Freq: Every day | ORAL | Status: DC
  Administered 2016-08-30: 80 mg via ORAL
  Filled 2016-08-30: qty 4

## 2016-08-30 MED ORDER — OXYCODONE HCL 5 MG PO TABS: 5.0000 mg | ORAL_TABLET | ORAL | Status: DC | PRN

## 2016-08-30 MED ORDER — FENTANYL CITRATE (PF) 100 MCG/2ML IJ SOLN
INTRAMUSCULAR | Status: DC | PRN
  Administered 2016-08-30: 50 ug via INTRAVENOUS
  Administered 2016-08-30 (×2): 25 ug via INTRAVENOUS

## 2016-08-30 MED ORDER — ZOLPIDEM TARTRATE 5 MG PO TABS
5.0000 mg | ORAL_TABLET | Freq: Every evening | ORAL | Status: DC | PRN
  Administered 2016-08-30: 5 mg via ORAL
  Filled 2016-08-30: qty 1

## 2016-08-30 MED ORDER — SODIUM CHLORIDE 0.9 % WEIGHT BASED INFUSION
1.0000 mL/kg/h | INTRAVENOUS | Status: AC
  Administered 2016-08-30: 1 mL/kg/h via INTRAVENOUS

## 2016-08-30 MED ORDER — NITROGLYCERIN 0.4 MG SL SUBL: 0.4000 mg | SUBLINGUAL_TABLET | SUBLINGUAL | Status: DC | PRN

## 2016-08-30 MED ORDER — ACETAMINOPHEN 325 MG PO TABS: 650.0000 mg | ORAL_TABLET | ORAL | Status: DC | PRN

## 2016-08-30 MED ORDER — BIVALIRUDIN 250 MG IV SOLR
INTRAVENOUS | Status: AC
  Filled 2016-08-30: qty 250

## 2016-08-30 MED ORDER — ALPRAZOLAM 0.25 MG PO TABS: 0.2500 mg | ORAL_TABLET | Freq: Two times a day (BID) | ORAL | Status: DC | PRN

## 2016-08-30 MED ORDER — GI COCKTAIL ~~LOC~~: 30.0000 mL | Freq: Four times a day (QID) | ORAL | Status: DC | PRN

## 2016-08-30 MED ORDER — ASPIRIN EC 81 MG PO TBEC
81.0000 mg | DELAYED_RELEASE_TABLET | Freq: Every day | ORAL | Status: DC
  Administered 2016-08-30 – 2016-08-31 (×2): 81 mg via ORAL
  Filled 2016-08-30 (×2): qty 1

## 2016-08-30 MED ORDER — VITAMIN D 1000 UNITS PO TABS
1000.0000 [IU] | ORAL_TABLET | Freq: Every day | ORAL | Status: DC
  Administered 2016-08-30 – 2016-08-31 (×2): 1000 [IU] via ORAL
  Filled 2016-08-30 (×2): qty 1

## 2016-08-30 MED ORDER — ACETAMINOPHEN 325 MG PO TABS
650.0000 mg | ORAL_TABLET | ORAL | Status: DC | PRN
  Administered 2016-08-30: 650 mg via ORAL
  Filled 2016-08-30: qty 2

## 2016-08-30 MED ORDER — ASPIRIN 81 MG PO CHEW
CHEWABLE_TABLET | ORAL | Status: DC | PRN
  Administered 2016-08-30: 324 mg via ORAL

## 2016-08-30 MED ORDER — HYDRALAZINE HCL 20 MG/ML IJ SOLN: 5.0000 mg | INTRAMUSCULAR | Status: AC | PRN

## 2016-08-30 MED ORDER — BIVALIRUDIN 250 MG IV SOLR
INTRAVENOUS | Status: DC | PRN
  Administered 2016-08-30: 1.75 mg/kg/h via INTRAVENOUS

## 2016-08-30 MED ORDER — GABAPENTIN 300 MG PO CAPS
300.0000 mg | ORAL_CAPSULE | Freq: Three times a day (TID) | ORAL | Status: DC
  Administered 2016-08-30 – 2016-08-31 (×3): 300 mg via ORAL
  Filled 2016-08-30 (×3): qty 1

## 2016-08-30 MED ORDER — MORPHINE SULFATE (PF) 4 MG/ML IV SOLN
2.0000 mg | INTRAVENOUS | Status: DC | PRN
  Administered 2016-08-30: 2 mg via INTRAVENOUS
  Filled 2016-08-30: qty 1

## 2016-08-30 MED ORDER — SODIUM CHLORIDE 0.9 % WEIGHT BASED INFUSION: 3.0000 mL/kg/h | INTRAVENOUS | Status: DC

## 2016-08-30 MED ORDER — ONDANSETRON HCL 4 MG/2ML IJ SOLN
4.0000 mg | Freq: Four times a day (QID) | INTRAMUSCULAR | Status: DC | PRN
  Administered 2016-08-30: 4 mg via INTRAVENOUS
  Filled 2016-08-30: qty 2

## 2016-08-30 MED ORDER — MORPHINE SULFATE (PF) 4 MG/ML IV SOLN: 1.0000 mg | INTRAVENOUS | Status: DC | PRN

## 2016-08-30 MED ORDER — CLOPIDOGREL BISULFATE 75 MG PO TABS
ORAL_TABLET | ORAL | Status: AC
  Filled 2016-08-30: qty 2

## 2016-08-30 MED ORDER — MIDAZOLAM HCL 2 MG/2ML IJ SOLN
INTRAMUSCULAR | Status: AC
  Filled 2016-08-30: qty 2

## 2016-08-30 MED ORDER — SODIUM CHLORIDE 0.9 % WEIGHT BASED INFUSION: 1.0000 mL/kg/h | INTRAVENOUS | Status: DC

## 2016-08-30 MED ORDER — ENOXAPARIN SODIUM 40 MG/0.4ML ~~LOC~~ SOLN
40.0000 mg | SUBCUTANEOUS | Status: DC
  Administered 2016-08-30: 40 mg via SUBCUTANEOUS
  Filled 2016-08-30: qty 0.4

## 2016-08-30 MED ORDER — BIVALIRUDIN BOLUS VIA INFUSION - CUPID
INTRAVENOUS | Status: DC | PRN
  Administered 2016-08-30: 54.375 mg via INTRAVENOUS

## 2016-08-30 MED ORDER — ASPIRIN 81 MG PO CHEW
81.0000 mg | CHEWABLE_TABLET | Freq: Every day | ORAL | Status: DC
  Filled 2016-08-30: qty 1

## 2016-08-30 MED ORDER — ASPIRIN 81 MG PO CHEW: 81.0000 mg | CHEWABLE_TABLET | ORAL | Status: DC

## 2016-08-30 MED ORDER — MIDAZOLAM HCL 2 MG/2ML IJ SOLN
INTRAMUSCULAR | Status: DC | PRN
  Administered 2016-08-30 (×2): 1 mg via INTRAVENOUS

## 2016-08-30 MED ORDER — NITROGLYCERIN 1 MG/10 ML FOR IR/CATH LAB
INTRA_ARTERIAL | Status: DC | PRN
  Administered 2016-08-30 (×2): 300 ug via INTRACORONARY
  Administered 2016-08-30: 200 ug via INTRACORONARY

## 2016-08-30 SURGICAL SUPPLY — 17 items
BALLN TREK RX 2.25X20 (BALLOONS) ×3
BALLOON TREK RX 2.25X20 (BALLOONS) ×1 IMPLANT
CATH 5FR JL4 DIAGNOSTIC (CATHETERS) ×2 IMPLANT
CATH 5FR PIGTAIL DIAGNOSTIC (CATHETERS) ×3 IMPLANT
CATH INFINITI JR4 5F (CATHETERS) ×3 IMPLANT
CATH VISTA GUIDE 6FR XB4 (CATHETERS) ×3 IMPLANT
DEVICE CLOSURE MYNXGRIP 6/7F (Vascular Products) ×3 IMPLANT
DEVICE INFLAT 30 PLUS (MISCELLANEOUS) ×3 IMPLANT
GUIDEWIRE EMER 3M J .025X150CM (WIRE) IMPLANT
KIT MANI 3VAL PERCEP (MISCELLANEOUS) ×3 IMPLANT
NEEDLE PERC 18GX7CM (NEEDLE) ×3 IMPLANT
PACK CARDIAC CATH (CUSTOM PROCEDURE TRAY) ×3 IMPLANT
SHEATH AVANTI 5FR X 11CM (SHEATH) ×3 IMPLANT
SHEATH AVANTI 6FR X 11CM (SHEATH) ×3 IMPLANT
STENT XIENCE ALPINE RX 2.5X33 (Permanent Stent) ×3 IMPLANT
WIRE ASAHI PROWATER 180CM (WIRE) ×3 IMPLANT
WIRE EMERALD 3MM-J .035X150CM (WIRE) ×3 IMPLANT

## 2016-08-30 NOTE — Progress Notes (Signed)
Upwards trending troponin - start therapeutic lovenox until seen by cardio Full note to follow

## 2016-08-30 NOTE — H&P (Signed)
[facility] - [place] @ [facility] Admission History and Physical [name], D.O.  ---------------------------------------------------------------------------------------------------------------------   PATIENT NAME: Sylvia Wiggins MR#: [id number] DATE OF BIRTH: 1961-04-03 DATE OF ADMISSION: 08/29/2016 PRIMARY CARE PHYSICIAN: Brayton El, MD  REQUESTING/REFERRING PHYSICIAN: ED Dr. Pershing Proud  CHIEF COMPLAINT: Chief Complaint  Patient presents with  . Back Pain    HISTORY OF PRESENT ILLNESS: Sylvia Wiggins is a 55 y.o. female with a known history of Coronary artery disease including MI with stent in June 2014, hypertension, hyperlipidemia, arthritis and depression presents to the emergency department complaining of chest pain.  Patient was in a usual state of health until this evening when she describes the sudden onset of mid scapular back pain associated with chest pressure that radiated to her jaw and her right arm. She states that the pain was exactly the same as when she had an MI in 2014 which required stents. She reports that she took a full dose aspirin prior to her arrival and that her chest pain was relieved with nitroglycerin in the emergency department, while her upper back pain was relieved after morphine.  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 She underwent cardiac evaluation for clearance on 08/23/2016 and was given permission to proceed. She states that she recently had a stress test and workup with her cardiologist Dr. Gwen Pounds which was negative  She denied any associated nausea, palpitations, shortness of breath or diaphoresis. She denies any new physical exercise, change in diet or medications, recent illness..  Otherwise there has been no change in status. Patient has been taking medication as prescribed and there has been no recent change in medication or diet.  There has been no recent  illness, travel or sick contacts.    Patient denies fevers/chills, weakness, dizziness, chest pain, shortness of breath, N/V/C/D, abdominal pain, dysuria/frequency, changes in mental status.   PAST MEDICAL HISTORY: Past Medical History:  Diagnosis Date  . Abscess, gluteal, left    Recurrent drainage, followed by [facility]  . Arthritis   . CAD (coronary artery disease)    Stents in Clogged artery at [facility] June 2014  . Depression   . Hyperlipidemia   . Hypertension   . Myocardial infarction June 2014      PAST SURGICAL HISTORY: Past Surgical History:  Procedure Laterality Date  . CARDIAC SURGERY  03/30/2013   stent placement  . RECTAL SURGERY  Anal Fissure      SOCIAL HISTORY: Social History  Substance Use Topics  . Smoking status: Current Every Day Smoker    Packs/day: 0.25    Types: Cigarettes  . Smokeless tobacco: Never Used  . Alcohol use No      FAMILY HISTORY: Family History  Problem Relation Age of Onset  . Cancer Mother     colon  . Heart disease Father     Died of Heart Attack     MEDICATIONS AT HOME: Prior to Admission medications   Medication Sig Start Date End Date Taking? Authorizing Provider  aspirin EC 81 MG tablet Take 81 mg by mouth daily.    Yes Historical Provider, MD  cholecalciferol (VITAMIN D) 1000 units tablet Take 1,000 Units by mouth daily.   Yes Historical Provider, MD  gabapentin (NEURONTIN) 300 MG capsule Take 300 mg by mouth 3 (three) times daily.    Yes Historical Provider, MD  metoprolol tartrate (LOPRESSOR) 25 MG tablet Take 0.5 tablets (12.5 mg total)  by mouth 2 (two) times daily. 01/26/16  Yes Ellyn HackSyed Asad A Shah, MD  simvastatin (ZOCOR) 40 MG tablet Take 1 tablet (40 mg total) by mouth daily at 6 PM. 01/26/16  Yes Ellyn HackSyed Asad A Shah, MD  venlafaxine XR (EFFEXOR-XR) 75 MG 24 hr capsule Take 1 capsule (75 mg total) by mouth daily with breakfast. 01/26/16  Yes Ellyn HackSyed Asad A Shah, MD  vitamin B-12 (CYANOCOBALAMIN) 1000 MCG tablet Take 1,000 mcg  by mouth daily.   Yes Historical Provider, MD      DRUG ALLERGIES: Allergies  Allergen Reactions  . Penicillins Nausea Only     REVIEW OF SYSTEMS: CONSTITUTIONAL: No fatigue, weakness, fever, chills, weight gain/loss, headache EYES: No blurry or double vision. ENT: No tinnitus, postnasal drip, redness or soreness of the oropharynx. RESPIRATORY: No dyspnea, cough, wheeze, hemoptysis. CARDIOVASCULAR: Positive chest pain, orthopnea, palpitations, syncope. GASTROINTESTINAL: No nausea, vomiting, constipation, diarrhea, abdominal pain. No hematemesis, melena or hematochezia. GENITOURINARY: No dysuria, frequency, hematuria. ENDOCRINE: No polyuria or nocturia. No heat or cold intolerance. HEMATOLOGY: No anemia, bruising, bleeding. INTEGUMENTARY: No rashes, ulcers, lesions. MUSCULOSKELETAL: No pain, arthritis, swelling, gout. Positive upper back pain NEUROLOGIC: No numbness, tingling, weakness or ataxia. No seizure-type activity. PSYCHIATRIC: No anxiety, depression, insomnia.  PHYSICAL EXAMINATION: VITAL SIGNS: Blood pressure 115/74, pulse 71, temperature 97.7 F (36.5 C), temperature source Oral, resp. rate 17, height 5\' 1"  (1.549 m), weight 68 kg (150 lb), SpO2 95 %.  GENERAL: 55 y.o.-year-old white female patient, well-developed, well-nourished lying in the bed in no acute distress.  Pleasant and cooperative.   HEENT: Head atraumatic, normocephalic. Pupils equal, round, reactive to light and accommodation. No scleral icterus. Extraocular muscles intact. Oropharynx is clear. Mucus membranes moist. NECK: Supple, full range of motion. No JVD, no bruit heard. No cervical lymphadenopathy. CHEST: Normal breath sounds bilaterally. No wheezing, rales, rhonchi or crackles. No use of accessory muscles of respiration.  No reproducible chest wall tenderness.  CARDIOVASCULAR: S1, S2 normal. No murmurs, rubs, or gallops appreciated. Cap refill <2 seconds. ABDOMEN: Soft, nontender, nondistended. No  rebound, guarding, rigidity. Normoactive bowel sounds present in all four quadrants. No organomegaly or mass. EXTREMITIES: Full range of motion. No pedal edema, cyanosis, or clubbing. NEUROLOGIC: Cranial nerves II through XII are grossly intact with no focal sensorimotor deficit. Muscle strength 5/5 in all extremities. Sensation intact. Gait not checked. PSYCHIATRIC: The patient is alert and oriented x 3. Normal affect, mood, thought content. SKIN: Warm, dry, and intact without obvious rash, lesion, or ulcer.  LABORATORY PANEL:  CBC  Recent Labs Lab 08/29/16 2142  WBC 8.1  HGB 15.3  HCT 43.5  PLT 213   ----------------------------------------------------------------------------------------------------------------- Chemistries  Recent Labs Lab 08/29/16 2142  NA 139  K 4.3  CL 104  CO2 27  GLUCOSE 299*  BUN 11  CREATININE 0.94  CALCIUM 9.7  AST 33  ALT 32  ALKPHOS 90  BILITOT 0.6   ------------------------------------------------------------------------------------------------------------------ Cardiac Enzymes  Recent Labs Lab 08/29/16 2142  TROPONINI <0.03   ------------------------------------------------------------------------------------------------------------------  RADIOLOGY: Dg Chest 1 View  Result Date: 08/29/2016 CLINICAL DATA:  Chest pain, back pain, radiation to the right arm and jaw. Pain started at 7:30 tonight. History of myocardial infarct and stent. EXAM: CHEST 1 VIEW COMPARISON:  None. FINDINGS: The heart size and mediastinal contours are within normal limits. Both lungs are clear. The visualized skeletal structures are unremarkable. Coronary stent. IMPRESSION: No active disease. Electronically Signed   By: Burman NievesWilliam  Stevens M.D.   On: 08/29/2016 22:08  EKG: Normal sinus rhythm at 70 bpm with normal axis and T-wave inversions in V2 and V3.  IMPRESSION AND PLAN:  This is a 55 y.o. female with a history of Coronary artery disease including MI  with stent in June 2014, hypertension, hyperlipidemia, arthritis and depression now being admitted with:  1. Chest pain, rule out ACS - Admit to observation with telemetry monitoring. - Trend troponins, check lipids and TSH. - Morphine, nitro, beta blocker, aspirin and statin ordered.   - Cardiology consult requested - Dr. Gwen PoundsKowalski  2. Hyperglycemia with no history of diabetes or recent steroid use. -We'll check hemoglobin A1c, Accu-Cheks before meals at bedtime  3. History of hypertension-continue Lopressor 4. History of hyperlipidemia-continue Zocor 5. History of depression-continue Effexor Continue Neurontin, B12, vitamin D.   Diet/Nutrition: Heart healthy Fluids: HL DVT Px: Lovenox, SCDs and early ambulation Code Status: Full  All the records are reviewed and case discussed with ED provider. Management plans discussed with the patient and/or family who express understanding and agree with plan of care.   TOTAL TIME TAKING CARE OF THIS PATIENT: 60 minutes.   Zeshan Sena D.O. on 08/30/2016 at 12:20 AM Between 7am to 6pm - Pager - [phone] After 6pm go to [email or web address] - Social research officer, governmentpassword EPAS [facility] [place] [facility] Office [phone] CC: Primary care physician; Brayton ElSyed Shah, MD     Note: This dictation was prepared with Dragon dictation along with smaller phrase technology. Any transcriptional errors that result from this process are unintentional.

## 2016-08-30 NOTE — Progress Notes (Signed)
Lab called to report a critical toponin value of 0.14, whenever the first troponin was negative. MD paged, Dr. Sheryle Hailiamond made aware, no new orders given. Will continue to monitor. Shirley FriarAlexis Miller, RN, BSN

## 2016-08-30 NOTE — Consult Note (Signed)
[facility]  CARDIOLOGY CONSULT NOTE  Patient ID: Sylvia Wiggins MRN: [id number] DOB/AGE: 05-25-1961 55 y.o.  Admit date: 08/29/2016 Referring Physician [place] Primary Physician Sherryll BurgerShah Primary Cardiologist Gwen PoundsKowalski Reason for Consultation Chest pain  HPI: 55 year old female referred for evaluation of chest pain. The patient has known coronary artery disease, status post anterior STEMI, DES of LAD on 03/30/2013. Cardiac catheterization that time revealed diffuse RCA disease. The [facility] evening, with chief complaint of mid scapular pain with radiation to her jaw and left arm, similar to symptoms she experienced during her anterior STEMI. EKG was nondiagnostic. Troponin was elevated to 0.25.  Review of systems complete and found to be negative unless listed above     Past Medical History:  Diagnosis Date  . Abscess, gluteal, left    [facility]  . Arthritis   . CAD (coronary artery disease)    [facility]  . Depression   . Hyperlipidemia   . Hypertension   . Myocardial infarction June 2014    Past Surgical History:  Procedure Laterality Date  . CARDIAC SURGERY  03/30/2013   stent placement  . RECTAL SURGERY  Anal Fissure    Prescriptions Prior to Admission  Medication Sig Dispense Refill Last Dose  . aspirin EC 81 MG tablet Take 81 mg by mouth daily.    08/29/2016 at Unknown time  . cholecalciferol (VITAMIN D) 1000 units tablet Take 1,000 Units by mouth daily.   08/29/2016 at Unknown time  . gabapentin (NEURONTIN) 300 MG capsule Take 300 mg by mouth 3 (three) times daily.    08/29/2016 at Unknown time  . metoprolol tartrate (LOPRESSOR) 25 MG tablet Take 0.5 tablets (12.5 mg total) by mouth 2 (two) times daily. 60 tablet 0 08/29/2016 at Unknown time  . simvastatin (ZOCOR) 40 MG tablet Take 1 tablet (40 mg total) by mouth daily at 6 PM. 30 tablet 2 08/29/2016 at Unknown time  .  venlafaxine XR (EFFEXOR-XR) 75 MG 24 hr capsule Take 1 capsule (75 mg total) by mouth daily with breakfast. 30 capsule 2 08/29/2016 at Unknown time  . vitamin B-12 (CYANOCOBALAMIN) 1000 MCG tablet Take 1,000 mcg by mouth daily.   08/29/2016 at Unknown time   Social History   Social History  . Marital status: Married    Spouse name: N/A  . Number of children: N/A  . Years of education: N/A   Occupational History  . Not on file.   Social History Main Topics  . Smoking status: Current Every Day Smoker    Packs/day: 0.25    Types: Cigarettes  . Smokeless tobacco: Never Used  . Alcohol use No  . Drug use: No  . Sexual activity: No   Other Topics Concern  . Not on file   Social History Narrative  . No narrative on file    Family History  Problem Relation Age of Onset  . Cancer Mother     colon  . Heart disease Father     Died of Heart Attack      Review of systems complete and found to be negative unless listed above      PHYSICAL EXAM  General: Well developed, well nourished, in no acute distress HEENT:  Normocephalic and atramatic Neck:  No JVD.  Lungs: Clear bilaterally to auscultation and percussion. Heart: HRRR . Normal S1 and S2 without gallops or murmurs.  Abdomen: Bowel sounds are positive, abdomen  soft and non-tender  Msk:  Back normal, normal gait. Normal strength and tone for age. Extremities: No clubbing, cyanosis or edema.   Neuro: Alert and oriented X 3. Psych:  Good affect, responds appropriately  Labs:   Lab Results  Component Value Date   WBC 8.0 08/30/2016   HGB 15.3 08/30/2016   HCT 44.5 08/30/2016   MCV 98.4 08/30/2016   PLT 222 08/30/2016    Recent Labs Lab 08/29/16 2142 08/30/16 0143  NA 139  --   K 4.3  --   CL 104  --   CO2 27  --   BUN 11  --   CREATININE 0.94 0.91  CALCIUM 9.7  --   PROT 7.2  --   BILITOT 0.6  --   ALKPHOS 90  --   ALT 32  --   AST 33  --   GLUCOSE 299*  --    Lab Results  Component Value Date    CKTOTAL 102 03/29/2013   CKMB 2.9 03/29/2013   TROPONINI 0.25 (HH) 08/30/2016    Lab Results  Component Value Date   CHOL 279 (H) 01/26/2016   Lab Results  Component Value Date   HDL 39 (L) 01/26/2016   Lab Results  Component Value Date   LDLCALC 181 (H) 01/26/2016   Lab Results  Component Value Date   TRIG 296 (H) 01/26/2016   Lab Results  Component Value Date   CHOLHDL 7.2 (H) 01/26/2016   No results found for: LDLDIRECT    Radiology: Dg Chest 1 View  Result Date: 08/29/2016 CLINICAL DATA:  Chest pain, back pain, radiation to the right arm and jaw. Pain started at 7:30 tonight. History of myocardial infarct and stent. EXAM: CHEST 1 VIEW COMPARISON:  None. FINDINGS: The heart size and mediastinal contours are within normal limits. Both lungs are clear. The visualized skeletal structures are unremarkable. Coronary stent. IMPRESSION: No active disease. Electronically Signed   By: Burman NievesWilliam  Stevens M.D.   On: 08/29/2016 22:08    EKG: Normal sinus rhythm  ASSESSMENT AND PLAN:   1. Chest pain, elevated troponin, consistent with unstable angina versus non-STEMI 2. Known history of CAD, status post anterior STEMI and DES LAD 03/30/2013 with residual diffuse RCA disease  Recommendations  1. Agree with current therapy 2. Proceed with cardiac catheterization, and possible PCI. The risks, benefits and alternatives were explained to the patient and informed consent was obtained.   Signed: Marcina MillardAlexander Darriel Utter MD,PhD, [facility] 08/30/2016, 8:32 AM

## 2016-08-30 NOTE — Progress Notes (Signed)
[facility] [place] [facility]   PATIENT NAME: Sylvia LarssonCindy Wiggins    MRN#:  [id number]  DATE OF BIRTH:  [birth year]  SUBJECTIVE:  Hospital Day: 0 days Sylvia LarssonCindy Wiggins is a 55 y.o. female presenting with Back Pain .   Overnight events: No acute overnight events Interval Events: Patient's troponin trending upwards however denies any further complaints  REVIEW OF SYSTEMS:  CONSTITUTIONAL: No fever, fatigue or weakness.  EYES: No blurred or double vision.  EARS, NOSE, AND THROAT: No tinnitus or ear pain.  RESPIRATORY: No cough, shortness of breath, wheezing or hemoptysis.  CARDIOVASCULAR: Positive chest pain, denies orthopnea, edema.  GASTROINTESTINAL: No nausea, vomiting, diarrhea or abdominal pain.  GENITOURINARY: No dysuria, hematuria.  ENDOCRINE: No polyuria, nocturia,  HEMATOLOGY: No anemia, easy bruising or bleeding SKIN: No rash or lesion. MUSCULOSKELETAL: No joint pain or arthritis.   NEUROLOGIC: No tingling, numbness, weakness.  PSYCHIATRY: No anxiety or depression.   DRUG ALLERGIES:   Allergies  Allergen Reactions  . Penicillins Nausea Only    VITALS:  Blood pressure (!) 148/81, pulse 60, temperature 98.3 F (36.8 C), temperature source Oral, resp. rate 15, height 5\' 1"  (1.549 m), weight 72.5 kg (159 lb 12.8 oz), SpO2 99 %.  PHYSICAL EXAMINATION:  VITAL SIGNS: Vitals:   08/30/16 1140 08/30/16 1209  BP: 110/68 (!) 148/81  Pulse: (!) 57 60  Resp: 18 15  Temp: 97.8 F (36.6 C) 98.3 F (36.8 C)   GENERAL:55 y.o.female currently in no acute distress.  HEAD: Normocephalic, atraumatic.  EYES: Pupils equal, round, reactive to light. Extraocular muscles intact. No scleral icterus.  MOUTH: Moist mucosal membrane. Dentition intact. No abscess noted.  EAR, NOSE, THROAT: Clear without exudates. No external lesions.  NECK: Supple. No thyromegaly. No nodules. No JVD.  PULMONARY: Clear to ascultation, without wheeze rails or rhonci. No use of  accessory muscles, Good respiratory effort. good air entry bilaterally CHEST: Nontender to palpation.  CARDIOVASCULAR: S1 and S2. Regular rate and rhythm. No murmurs, rubs, or gallops. No edema. Pedal pulses 2+ bilaterally.  GASTROINTESTINAL: Soft, nontender, nondistended. No masses. Positive bowel sounds. No hepatosplenomegaly.  MUSCULOSKELETAL: No swelling, clubbing, or edema. Range of motion full in all extremities.  NEUROLOGIC: Cranial nerves II through XII are intact. No gross focal neurological deficits. Sensation intact. Reflexes intact.  SKIN: No ulceration, lesions, rashes, or cyanosis. Skin warm and dry. Turgor intact.  PSYCHIATRIC: Mood, affect within normal limits. The patient is awake, alert and oriented x 3. Insight, judgment intact.      LABORATORY PANEL:   CBC  Recent Labs Lab 08/30/16 0143  WBC 8.0  HGB 15.3  HCT 44.5  PLT 222   ------------------------------------------------------------------------------------------------------------------  Chemistries   Recent Labs Lab 08/29/16 2142 08/30/16 0143  NA 139  --   K 4.3  --   CL 104  --   CO2 27  --   GLUCOSE 299*  --   BUN 11  --   CREATININE 0.94 0.91  CALCIUM 9.7  --   AST 33  --   ALT 32  --   ALKPHOS 90  --   BILITOT 0.6  --    ------------------------------------------------------------------------------------------------------------------  Cardiac Enzymes  Recent Labs Lab 08/30/16 0824  TROPONINI 0.46*   ------------------------------------------------------------------------------------------------------------------  RADIOLOGY:  Dg Chest 1 View  Result Date: 08/29/2016 CLINICAL DATA:  Chest pain, back pain, radiation to the right arm and jaw. Pain started at 7:30 tonight. History of myocardial infarct and stent. EXAM: CHEST 1  VIEW COMPARISON:  None. FINDINGS: The heart size and mediastinal contours are within normal limits. Both lungs are clear. The visualized skeletal structures are  unremarkable. Coronary stent. IMPRESSION: No active disease. Electronically Signed   By: Burman NievesWilliam  Stevens M.D.   On: 08/29/2016 22:08    EKG:   Orders placed or performed during the hospital encounter of 08/29/16  . EKG 12-Lead (at 6am)  . EKG 12-Lead (Repeat cardiac markers, recurrent chest pain)  . EKG 12-Lead (Repeat cardiac markers, recurrent chest pain)  . EKG 12-Lead  . EKG 12-Lead    ASSESSMENT AND PLAN:   Sylvia LarssonCindy Wiggins is a 55 y.o. female presenting with Back Pain . Admitted 08/29/2016 : Day #: 0 days 1. NSTEMI: Aspirin and statin and Lovenox cardiology for cardiac catheterization 2. Hyperglycemia follow up on hemoglobin A1c 3. Essential hypertension Lopressor 4. Hyperlipidemia unspecified switched to high-dose Lipitor  All the records are reviewed and case discussed with Care Management/Social Workerr. Management plans discussed with the patient, family and they are in agreement.  CODE STATUS: full TOTAL TIME TAKING CARE OF THIS PATIENT: 33 minutes.   POSSIBLE D/C IN 2-3DAYS, DEPENDING ON CLINICAL CONDITION.   Lenwood Balsam,  Mardi MainlandDavid K M.D on 08/30/2016 at 1:55 PM  Between 7am to 6pm - Pager - [phone]  After 6pm: House Pager: - [phone]  [facility]  Office  [phone]  CC: Primary care physician; Brayton ElSyed Shah, MD

## 2016-08-31 LAB — CBC
HEMATOCRIT: 44.6 % (ref 35.0–47.0)
Hemoglobin: 15.6 g/dL (ref 12.0–16.0)
MCH: 34.5 pg — ABNORMAL HIGH (ref 26.0–34.0)
MCHC: 34.9 g/dL (ref 32.0–36.0)
MCV: 99 fL (ref 80.0–100.0)
Platelets: 216 10*3/uL (ref 150–440)
RBC: 4.51 MIL/uL (ref 3.80–5.20)
RDW: 12.5 % (ref 11.5–14.5)
WBC: 8.9 10*3/uL (ref 3.6–11.0)

## 2016-08-31 LAB — BASIC METABOLIC PANEL
ANION GAP: 8 (ref 5–15)
BUN: 9 mg/dL (ref 6–20)
CO2: 25 mmol/L (ref 22–32)
Calcium: 9 mg/dL (ref 8.9–10.3)
Chloride: 106 mmol/L (ref 101–111)
Creatinine, Ser: 0.66 mg/dL (ref 0.44–1.00)
Glucose, Bld: 113 mg/dL — ABNORMAL HIGH (ref 65–99)
POTASSIUM: 3.9 mmol/L (ref 3.5–5.1)
SODIUM: 139 mmol/L (ref 135–145)

## 2016-08-31 LAB — HEMOGLOBIN A1C
HEMOGLOBIN A1C: 6.8 % — AB (ref 4.8–5.6)
MEAN PLASMA GLUCOSE: 148 mg/dL

## 2016-08-31 LAB — GLUCOSE, CAPILLARY: Glucose-Capillary: 139 mg/dL — ABNORMAL HIGH (ref 65–99)

## 2016-08-31 MED ORDER — NITROGLYCERIN 0.4 MG SL SUBL: 0.4000 mg | SUBLINGUAL_TABLET | SUBLINGUAL | 12 refills | Status: DC | PRN

## 2016-08-31 MED ORDER — ATORVASTATIN CALCIUM 80 MG PO TABS: 80.0000 mg | ORAL_TABLET | Freq: Every day | ORAL | 0 refills | Status: DC

## 2016-08-31 MED ORDER — CLOPIDOGREL BISULFATE 75 MG PO TABS
75.0000 mg | ORAL_TABLET | Freq: Every day | ORAL | 0 refills | Status: DC
Start: 2016-09-01 — End: 2020-02-17

## 2016-08-31 NOTE — Progress Notes (Signed)
Patient discharged via wheelchair and private vehicle. IV removed and catheter intact. All discharge instructions given and patient verbalizes understanding. Tele removed and returned. No prescriptions given to patient No distress noted.   

## 2016-08-31 NOTE — Progress Notes (Signed)
[facility]  SUBJECTIVE: I feel better   Vitals:   08/30/16 1600 08/30/16 1731 08/30/16 2002 08/31/16 0429  BP: 121/73 127/74 (!) 142/79 130/76  Pulse: 62 64 75 66  Resp: 16 17 18 18   Temp:  98.2 F (36.8 C) 97.8 F (36.6 C) 98 F (36.7 C)  TempSrc:  Oral Oral Oral  SpO2: 93% 97% 97% 96%  Weight:      Height:         Intake/Output Summary (Last 24 hours) at 08/31/16 0912 Last data filed at 08/31/16 0300  Gross per 24 hour  Intake              641 ml  Output             1400 ml  Net             -759 ml      PHYSICAL EXAM  General: Well developed, well nourished, in no acute distress HEENT:  Normocephalic and atramatic Neck:  No JVD.  Lungs: Clear bilaterally to auscultation and percussion. Heart: HRRR . Normal S1 and S2 without gallops or murmurs.  Abdomen: Bowel sounds are positive, abdomen soft and non-tender  Msk:  Back normal, normal gait. Normal strength and tone for age. Extremities: No clubbing, cyanosis or edema.   Neuro: Alert and oriented X 3. Psych:  Good affect, responds appropriately   LABS: Basic Metabolic Panel:  Recent Labs  16/07/9610/21/17 2142 08/30/16 0143 08/31/16 0422  NA 139  --  139  K 4.3  --  3.9  CL 104  --  106  CO2 27  --  25  GLUCOSE 299*  --  113*  BUN 11  --  9  CREATININE 0.94 0.91 0.66  CALCIUM 9.7  --  9.0   Liver Function Tests:  Recent Labs  08/29/16 2142  AST 33  ALT 32  ALKPHOS 90  BILITOT 0.6  PROT 7.2  ALBUMIN 4.0   No results for input(s): LIPASE, AMYLASE in the last 72 hours. CBC:  Recent Labs  08/29/16 2142 08/30/16 0143 08/31/16 0422  WBC 8.1 8.0 8.9  NEUTROABS 5.3  --   --   HGB 15.3 15.3 15.6  HCT 43.5 44.5 44.6  MCV 98.8 98.4 99.0  PLT 213 222 216   Cardiac Enzymes:  Recent Labs  08/30/16 0143 08/30/16 0425 08/30/16 0824  TROPONINI 0.14* 0.25* 0.46*   BNP: Invalid input(s): POCBNP D-Dimer: No results for input(s): DDIMER in the last 72 hours. Hemoglobin A1C:  Recent Labs  08/30/16 0824  HGBA1C 6.8*   Fasting Lipid Panel:  Recent Labs  08/30/16 0824  CHOL 190  HDL 33*  LDLCALC UNABLE TO CALCULATE IF TRIGLYCERIDE OVER 400 mg/dL  TRIG [id number]*  CHOLHDL 5.8   Thyroid Function Tests:  Recent Labs  08/30/16 0143  TSH 1.888   Anemia Panel: No results for input(s): VITAMINB12, FOLATE, FERRITIN, TIBC, IRON, RETICCTPCT in the last 72 hours.  Dg Chest 1 View  Result Date: 08/29/2016 CLINICAL DATA:  Chest pain, back pain, radiation to the right arm and jaw. Pain started at 7:30 tonight. History of myocardial infarct and stent. EXAM: CHEST 1 VIEW COMPARISON:  None. FINDINGS: The heart size and mediastinal contours are within normal limits. Both lungs are clear. The visualized skeletal structures are unremarkable. Coronary stent. IMPRESSION: No active disease. Electronically Signed   By: Burman NievesWilliam  Stevens M.D.   On: 08/29/2016 22:08     Echo   TELEMETRY: Normal sinus  rhythm:  ASSESSMENT AND PLAN:  Active Problems:   Chest pain, rule out acute myocardial infarction   NSTEMI (non-ST elevated myocardial infarction) (HCC)    1. Non-STEMI, status post DES of OM2 with resolution of chest pain  Recommendations  1. Continue current medications 2. Ambulate today, if patient does well, discharge home on dual antiplatelet therapy   Marcina MillardAlexander Acelin Ferdig, MD, PhD, [facility] 08/31/2016 9:12 AM

## 2016-08-31 NOTE — Discharge Instructions (Addendum)
Clopidogrel tablets What is this medicine? CLOPIDOGREL (kloh PID oh grel) helps to prevent blood clots. This medicine is used to prevent heart attack, stroke, or other vascular events in people who are at high risk. This medicine may be used for other purposes; ask your health care provider or pharmacist if you have questions. COMMON BRAND NAME(S): Plavix What should I tell my health care provider before I take this medicine? They need to know if you have any of the following conditions: -bleeding disorders -bleeding in the brain -having surgery -history of stomach bleeding -an unusual or allergic reaction to clopidogrel, other medicines, foods, dyes, or preservatives -pregnant or trying to get pregnant -breast-feeding How should I use this medicine? Take this medicine by mouth with a glass of water. Follow the directions on the prescription label. You may take this medicine with or without food. If it upsets your stomach, take it with food. Take your medicine at regular intervals. Do not take it more often than directed. Do not stop taking except on your doctor's advice. A special MedGuide will be given to you by the pharmacist with each prescription and refill. Be sure to read this information carefully each time. Talk to your pediatrician regarding the use of this medicine in children. Special care may be needed. Overdosage: If you think you have taken too much of this medicine contact a poison control center or emergency room at once. NOTE: This medicine is only for you. Do not share this medicine with others. What if I miss a dose? If you miss a dose, take it as soon as you can. If it is almost time for your next dose, take only that dose. Do not take double or extra doses. What may interact with this medicine? Do not take this medicine with the following medications: -dasabuvir; ombitasvir; paritaprevir; ritonavir -defibrotide This medicine may also interact with the following  medications: -antiviral medicines for HIV or AIDS -aspirin -certain medicines for depression like citalopram, fluoxetine, fluvoxamine -certain medicines for fungal infections like ketoconazole, fluconazole, voriconazole -certain medicines for seizures like felbamate, oxcarbazepine, phenytoin -certain medicines for stomach problems like cimetidine, omeprazole, esomeprazole -certain medicines that treat or prevent blood clots like warfarin, enoxaparin, dalteparin, apixaban, dabigatran, rivaroxaban, ticlopidine -chloramphenicol -cilostazol -fluvastatin -isoniazid -modafinil -nicardipine -NSAIDS, medicines for pain and inflammation, like ibuprofen or naproxen -quinine -repaglinide -tamoxifen -tolbutamide -topiramate -torsemide This list may not describe all possible interactions. Give your health care provider a list of all the medicines, herbs, non-prescription drugs, or dietary supplements you use. Also tell them if you smoke, drink alcohol, or use illegal drugs. Some items may interact with your medicine. What should I watch for while using this medicine? Visit your doctor or health care professional for regular check ups. Do not stop taking your medicine unless your doctor tells you to. Notify your doctor or health care professional and seek emergency treatment if you develop breathing problems; changes in vision; chest pain; severe, sudden headache; pain, swelling, warmth in the leg; trouble speaking; sudden numbness or weakness of the face, arm or leg. These can be signs that your condition has gotten worse. If you are going to have surgery or dental work, tell your doctor or health care professional that you are taking this medicine. Certain genetic factors may reduce the effect of this medicine. Your doctor may use genetic tests to determine treatment. What side effects may I notice from receiving this medicine? Side effects that you should report to your doctor or health care  professional as soon as possible: -allergic reactions like skin rash, itching or hives, swelling of the face, lips, or tongue -signs and symptoms of bleeding such as bloody or black, tarry stools; red or dark-brown urine; spitting up blood or brown material that looks like coffee grounds; red spots on the skin; unusual bruising or bleeding from the eye, gums, or nose -signs and symptoms of a blood clot such as breathing problems; changes in vision; chest pain; severe, sudden headache; pain, swelling, warmth in the leg; trouble speaking; sudden numbness or weakness of the face, arm or leg Side effects that usually do not require medical attention (report to your doctor or health care professional if they continue or are bothersome): -constipation -diarrhea -headache -upset stomach This list may not describe all possible side effects. Call your doctor for medical advice about side effects. You may report side effects to FDA at 1-800-FDA-1088. Where should I keep my medicine? Keep out of the reach of children. Store at room temperature of 59 to 86 degrees F (15 to 30 degrees C). Throw away any unused medicine after the expiration date. NOTE: This sheet is a summary. It may not cover all possible information. If you have questions about this medicine, talk to your doctor, pharmacist, or health care provider.  2017 Elsevier/Gold Standard (2015-07-01 10:00:44) Coronary Angiogram With Stent, Care After This sheet gives you information about how to care for yourself after your procedure. Your health care provider may also give you more specific instructions. If you have problems or questions, contact your health care provider. What can I expect after the procedure? After your procedure, it is common to have:  Bruising in the area where a small, thin tube (catheter) was inserted. This usually fades within 1-2 weeks.  Blood collecting in the tissue (hematoma) that may be painful to the touch. It should  usually decrease in size and tenderness within 1-2 weeks. Follow these instructions at home: Insertion area care  Do not take baths, swim, or use a hot tub until your health care provider approves.  You may shower 24-48 hours after the procedure or as directed by your health care provider.  Follow instructions from your health care provider about how to take care of your incision. Make sure you:  Wash your hands with soap and water before you change your bandage (dressing). If soap and water are not available, use hand sanitizer.  Change your dressing as told by your health care provider.  Leave stitches (sutures), skin glue, or adhesive strips in place. These skin closures may need to stay in place for 2 weeks or longer. If adhesive strip edges start to loosen and curl up, you may trim the loose edges. Do not remove adhesive strips completely unless your health care provider tells you to do that.  Remove the bandage (dressing) and gently wash the catheter insertion site with plain soap and water.  Pat the area dry with a clean towel. Do not rub the area, because that may cause bleeding.  Do not apply powder or lotion to the incision area.  Check your incision area every day for signs of infection. Check for:  More redness, swelling, or pain.  More fluid or blood.  Warmth.  Pus or a bad smell. Activity  Do not drive for 24 hours if you were given a medicine to help you relax (sedative).  Do not lift anything that is heavier than 10 lb (4.5 kg) for 5 days after your procedure or as  directed by your health care provider.  Ask your health care provider when it is okay for you:  To return to work or school.  To resume usual physical activities or sports.  To resume sexual activity. Eating and drinking  Eat a heart-healthy diet. This should include plenty of fresh fruits and vegetables.  Avoid the following types of food:  Food that is high in salt.  Canned or highly  processed food.  Food that is high in saturated fat or sugar.  Fried food.  Limit alcohol intake to no more than 1 drink a day for non-pregnant women and 2 drinks a day for men. One drink equals 12 oz of beer, 5 oz of wine, or 1 oz of hard liquor. Lifestyle  Do not use any products that contain nicotine or tobacco, such as cigarettes and e-cigarettes. If you need help quitting, ask your health care provider.  Take steps to manage and control your weight.  Get regular exercise.  Manage your blood pressure.  Manage other health problems, such as diabetes. General instructions  Take over-the-counter and prescription medicines only as told by your health care provider. Blood thinners may be prescribed after your procedure to improve blood flow through the stent.  If you need an MRI after your heart stent has been placed, be sure to tell the health care provider who orders the MRI that you have a heart stent.  Keep all follow-up visits as directed by your health care provider. This is important. Contact a health care provider if:  You have a fever.  You have chills.  You have increased bleeding from the catheter insertion area. Hold pressure on the area. Get help right away if:  You develop chest pain or shortness of breath.  You feel faint or you pass out.  You have unusual pain at the catheter insertion area.  You have redness, warmth, or swelling at the catheter insertion area.  You have drainage (other than a small amount of blood on the dressing) from the catheter insertion area.  The catheter insertion area is bleeding, and the bleeding does not stop after 30 minutes of holding steady pressure on the area.  You develop bleeding from any other place, such as from your rectum. There may be bright red blood in your urine or stool, or it may appear as black, tarry stool. This information is not intended to replace advice given to you by your health care provider. Make  sure you discuss any questions you have with your health care provider. Document Released: 04/14/2005 Document Revised: 06/22/2016 Document Reviewed: 06/22/2016 Elsevier Interactive Patient Education  2017 [employer]

## 2016-08-31 NOTE — Discharge Summary (Addendum)
[facility] [place] [facility]   PATIENT NAME: Sylvia LarssonCindy Wiggins    MR#:  [id number]  DATE OF BIRTH:  [birth year]  DATE OF ADMISSION:  08/29/2016 ADMITTING PHYSICIAN: Tonye RoyaltyAlexis Hugelmeyer, DO  DATE OF DISCHARGE: 08/31/16  PRIMARY CARE PHYSICIAN: Brayton ElSyed Shah, MD    ADMISSION DIAGNOSIS:  Chest pain, unspecified type [R07.9] Thoracic back pain, unspecified back pain laterality, unspecified chronicity [M54.6]  DISCHARGE DIAGNOSIS:  Active Problems:   Chest pain, rule out acute myocardial infarction   NSTEMI (non-ST elevated myocardial infarction) (HCC)   SECONDARY DIAGNOSIS:   Past Medical History:  Diagnosis Date  . Abscess, gluteal, left    Recurrent drainage, [facility]  . Arthritis   . CAD (coronary artery disease)    [facility]  . Depression   . Hyperlipidemia   . Hypertension   . Myocardial infarction June 2014    HOSPITAL COURSE:  Sylvia Wiggins  is a 55 y.o. female admitted 08/29/2016 with chief complaint Back Pain . Please see H&P performed by Tonye RoyaltyAlexis Hugelmeyer, DO for further information. Patient presented with the above symptoms noted to have increasing cardiac enzymes. Underwent cardiac cath with DES to OM2 without difficulity  Discussed high does statin - patient intolerant previously  DISCHARGE CONDITIONS:   stable  CONSULTS OBTAINED:  Treatment Team:  Marcina MillardAlexander Paraschos, MD  DRUG ALLERGIES:   Allergies  Allergen Reactions  . Penicillins Nausea Only    DISCHARGE MEDICATIONS:   Current Discharge Medication List    START taking these medications   Details  clopidogrel (PLAVIX) 75 MG tablet Take 1 tablet (75 mg total) by mouth daily with breakfast. Qty: 30 tablet, Refills: 0    nitroGLYCERIN (NITROSTAT) 0.4 MG SL tablet Place 1 tablet (0.4 mg total) under the tongue every 5 (five) minutes as needed for chest pain. Qty: 30 tablet, Refills: 12      CONTINUE these medications which have  NOT CHANGED   Details  aspirin EC 81 MG tablet Take 81 mg by mouth daily.     cholecalciferol (VITAMIN D) 1000 units tablet Take 1,000 Units by mouth daily.    gabapentin (NEURONTIN) 300 MG capsule Take 300 mg by mouth 3 (three) times daily.     metoprolol tartrate (LOPRESSOR) 25 MG tablet Take 0.5 tablets (12.5 mg total) by mouth 2 (two) times daily. Qty: 60 tablet, Refills: 0   Associated Diagnoses: Coronary artery disease involving native coronary artery of native heart without angina pectoris; Essential hypertension    simvastatin (ZOCOR) 40 MG tablet Take 1 tablet (40 mg total) by mouth daily at 6 PM. Qty: 30 tablet, Refills: 2   Associated Diagnoses: Hyperlipidemia    venlafaxine XR (EFFEXOR-XR) 75 MG 24 hr capsule Take 1 capsule (75 mg total) by mouth daily with breakfast. Qty: 30 capsule, Refills: 2   Associated Diagnoses: Depression    vitamin B-12 (CYANOCOBALAMIN) 1000 MCG tablet Take 1,000 mcg by mouth daily.         DISCHARGE INSTRUCTIONS:    DIET:  Cardiac diet  DISCHARGE CONDITION:  Stable  ACTIVITY:  Activity as tolerated  OXYGEN:  Home Oxygen: No.   Oxygen Delivery: room air  DISCHARGE LOCATION:  home   If you experience worsening of your admission symptoms, develop shortness of breath, life threatening emergency, suicidal or homicidal thoughts you must seek medical attention immediately by calling 911 or calling your MD immediately  if symptoms less severe.  You Must read complete  instructions/literature along with all the possible adverse reactions/side effects for all the Medicines you take and that have been prescribed to you. Take any new Medicines after you have completely understood and accpet all the possible adverse reactions/side effects.   Please note  You were cared for by a hospitalist during your hospital stay. If you have any questions about your discharge medications or the care you received while you were in the hospital after you  are discharged, you can call the unit and asked to speak with the hospitalist on call if the hospitalist that took care of you is not available. Once you are discharged, your primary care physician will handle any further medical issues. Please note that NO REFILLS for any discharge medications will be authorized once you are discharged, as it is imperative that you return to your primary care physician (or establish a relationship with a primary care physician if you do not have one) for your aftercare needs so that they can reassess your need for medications and monitor your lab values.    On the day of Discharge:   VITAL SIGNS:  Blood pressure 130/76, pulse 66, temperature 98 F (36.7 C), temperature source Oral, resp. rate 18, height 5\' 1"  (1.549 m), weight 72.5 kg (159 lb 12.8 oz), SpO2 96 %.  I/O:   Intake/Output Summary (Last 24 hours) at 08/31/16 1019 Last data filed at 08/31/16 0300  Gross per 24 hour  Intake              641 ml  Output             1400 ml  Net             -759 ml    PHYSICAL EXAMINATION:  GENERAL:  55 y.o.-year-old patient lying in the bed with no acute distress.  EYES: Pupils equal, round, reactive to light and accommodation. No scleral icterus. Extraocular muscles intact.  HEENT: Head atraumatic, normocephalic. Oropharynx and nasopharynx clear.  NECK:  Supple, no jugular venous distention. No thyroid enlargement, no tenderness.  LUNGS: Normal breath sounds bilaterally, no wheezing, rales,rhonchi or crepitation. No use of accessory muscles of respiration.  CARDIOVASCULAR: S1, S2 normal. No murmurs, rubs, or gallops.  ABDOMEN: Soft, non-tender, non-distended. Bowel sounds present. No organomegaly or mass.  EXTREMITIES: No pedal edema, cyanosis, or clubbing.  NEUROLOGIC: Cranial nerves II through XII are intact. Muscle strength 5/5 in all extremities. Sensation intact. Gait not checked.  PSYCHIATRIC: The patient is alert and oriented x 3.  SKIN: No obvious  rash, lesion, or ulcer.   DATA REVIEW:   CBC  Recent Labs Lab 08/31/16 0422  WBC 8.9  HGB 15.6  HCT 44.6  PLT 216    Chemistries   Recent Labs Lab 08/29/16 2142  08/31/16 0422  NA 139  --  139  K 4.3  --  3.9  CL 104  --  106  CO2 27  --  25  GLUCOSE 299*  --  113*  BUN 11  --  9  CREATININE 0.94  < > 0.66  CALCIUM 9.7  --  9.0  AST 33  --   --   ALT 32  --   --   ALKPHOS 90  --   --   BILITOT 0.6  --   --   < > = values in this interval not displayed.  Cardiac Enzymes  Recent Labs Lab 08/30/16 0824  TROPONINI 0.46*    Microbiology Results  No  results found for this or any previous visit.  RADIOLOGY:  Dg Chest 1 View  Result Date: 08/29/2016 CLINICAL DATA:  Chest pain, back pain, radiation to the right arm and jaw. Pain started at 7:30 tonight. History of myocardial infarct and stent. EXAM: CHEST 1 VIEW COMPARISON:  None. FINDINGS: The heart size and mediastinal contours are within normal limits. Both lungs are clear. The visualized skeletal structures are unremarkable. Coronary stent. IMPRESSION: No active disease. Electronically Signed   By: Burman NievesWilliam  Stevens M.D.   On: 08/29/2016 22:08     Management plans discussed with the patient, family and they are in agreement.  CODE STATUS:     Code Status Orders        Start     Ordered   08/30/16 0115  Full code  Continuous     08/30/16 0114    Code Status History    Date Active Date Inactive Code Status Order ID Comments User Context   This patient has a current code status but no historical code status.      TOTAL TIME TAKING CARE OF THIS PATIENT: 33 minutes.    Hower,  Mardi MainlandDavid K M.D on 08/31/2016 at 10:19 AM  Between 7am to 6pm - Pager - [phone]  After 6pm go to [email or web address] - Social research officer, governmentpassword [email or web address]  [employer] [place] [facility]  Office  [phone]  CC: Primary care physician; Brayton ElSyed Shah, MD

## 2016-09-01 ENCOUNTER — Encounter: Payer: Self-pay | Admitting: Cardiology

## 2017-11-17 IMAGING — DX DG CHEST 1V
1 series · 1 of 1 positions shown · non-contrast
Comparison: None.

CLINICAL DATA: Chest pain, back pain, radiation to the right arm
and jaw. Pain started at [DATE] tonight. History of myocardial infarct
and stent.

EXAM:
CHEST 1 VIEW

[chest ap]
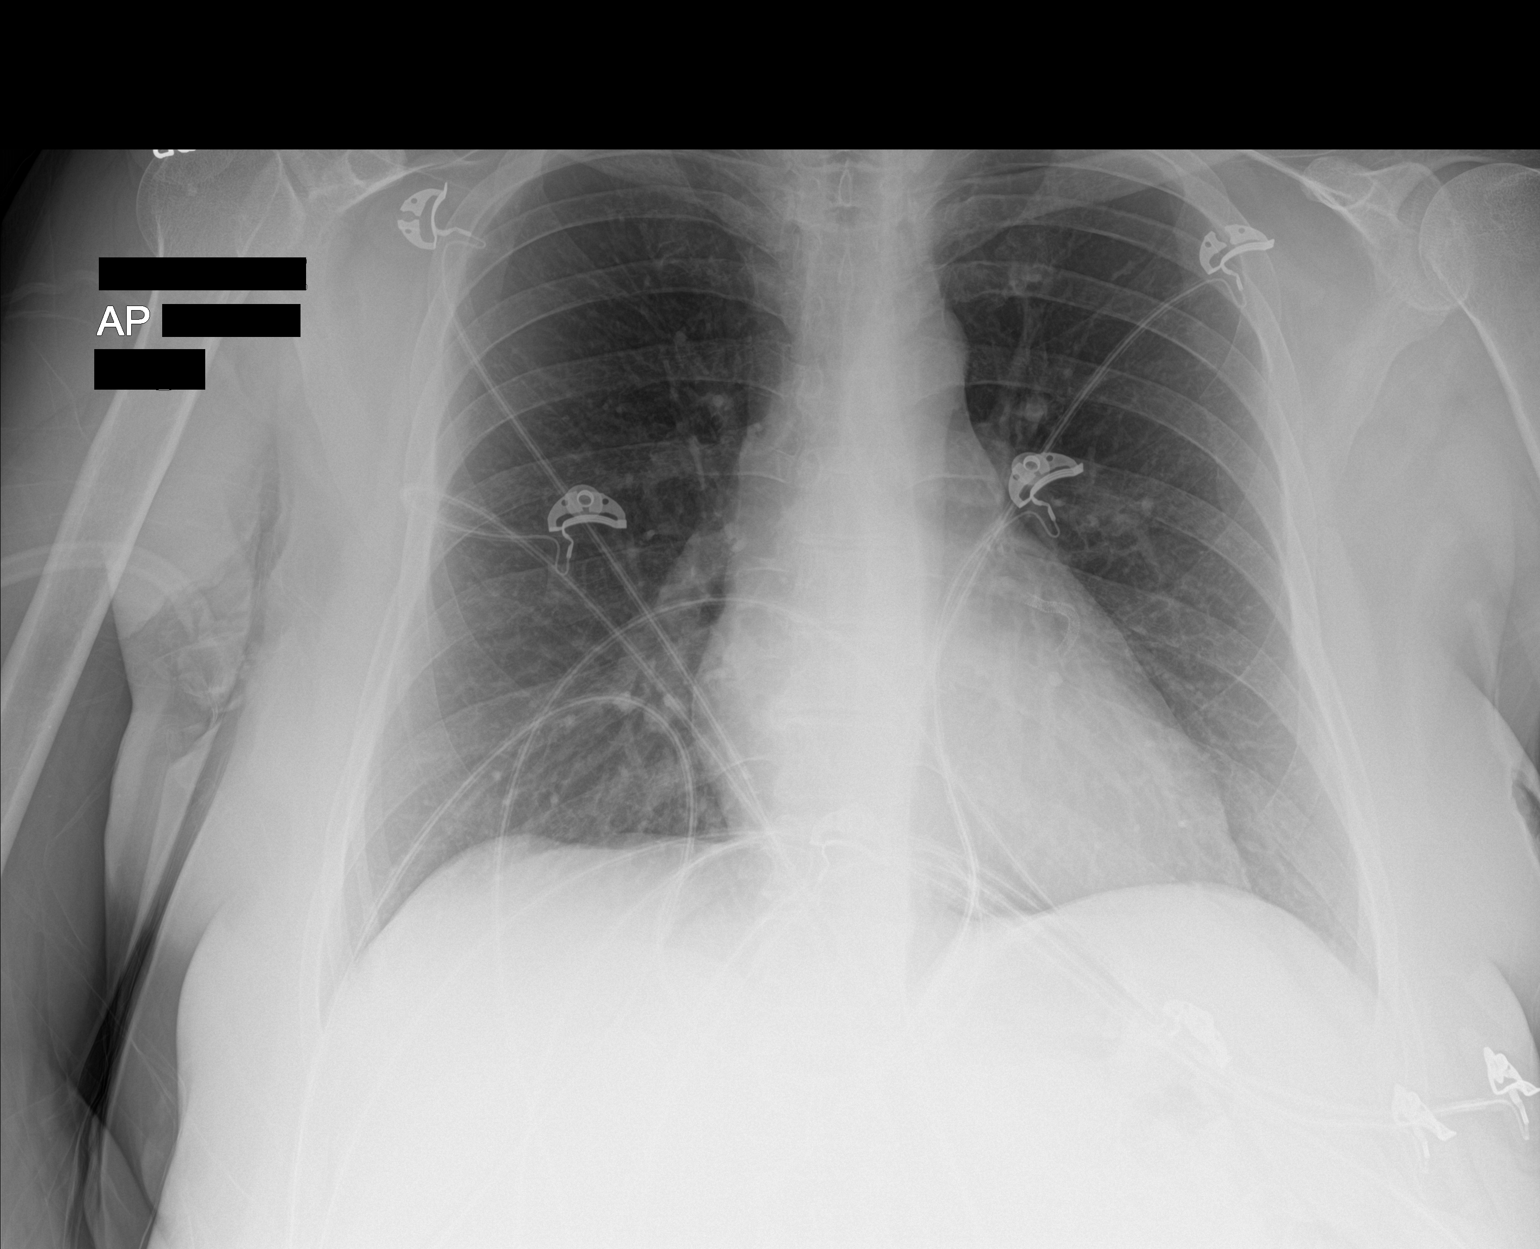

[1 of 1 positions shown; findings below may reference images not displayed]

FINDINGS: The heart size and mediastinal contours are within normal limits.
Both lungs are clear. The visualized skeletal structures are
unremarkable. Coronary stent.
IMPRESSION: No active disease.

## 2018-02-16 ENCOUNTER — Emergency Department: Payer: Medicaid Other

## 2018-02-16 ENCOUNTER — Encounter: Payer: Self-pay | Admitting: Emergency Medicine

## 2018-02-16 ENCOUNTER — Other Ambulatory Visit: Payer: Self-pay

## 2018-02-16 ENCOUNTER — Inpatient Hospital Stay
Admission: EM | Admit: 2018-02-16 | Discharge: 2018-02-18 | DRG: 282 | Disposition: A | Discharge status: Home / Self Care | Payer: Medicaid Other | Attending: Internal Medicine | Admitting: Internal Medicine

## 2018-02-16 DIAGNOSIS — E119 Type 2 diabetes mellitus without complications: Secondary | ICD-10-CM | POA: Diagnosis present

## 2018-02-16 DIAGNOSIS — R9431 Abnormal electrocardiogram [ECG] [EKG]: Secondary | ICD-10-CM

## 2018-02-16 DIAGNOSIS — I214 Non-ST elevation (NSTEMI) myocardial infarction: Principal | ICD-10-CM | POA: Diagnosis present

## 2018-02-16 DIAGNOSIS — I10 Essential (primary) hypertension: Secondary | ICD-10-CM

## 2018-02-16 DIAGNOSIS — Z7902 Long term (current) use of antithrombotics/antiplatelets: Secondary | ICD-10-CM

## 2018-02-16 DIAGNOSIS — Z79899 Other long term (current) drug therapy: Secondary | ICD-10-CM

## 2018-02-16 DIAGNOSIS — I2510 Atherosclerotic heart disease of native coronary artery without angina pectoris: Secondary | ICD-10-CM | POA: Diagnosis present

## 2018-02-16 DIAGNOSIS — I252 Old myocardial infarction: Secondary | ICD-10-CM

## 2018-02-16 DIAGNOSIS — M1990 Unspecified osteoarthritis, unspecified site: Secondary | ICD-10-CM | POA: Diagnosis present

## 2018-02-16 DIAGNOSIS — Z880 Allergy status to penicillin: Secondary | ICD-10-CM

## 2018-02-16 DIAGNOSIS — E782 Mixed hyperlipidemia: Secondary | ICD-10-CM | POA: Diagnosis present

## 2018-02-16 DIAGNOSIS — F329 Major depressive disorder, single episode, unspecified: Secondary | ICD-10-CM | POA: Diagnosis present

## 2018-02-16 DIAGNOSIS — I25119 Atherosclerotic heart disease of native coronary artery with unspecified angina pectoris: Secondary | ICD-10-CM | POA: Diagnosis present

## 2018-02-16 DIAGNOSIS — Z7982 Long term (current) use of aspirin: Secondary | ICD-10-CM

## 2018-02-16 DIAGNOSIS — R079 Chest pain, unspecified: Secondary | ICD-10-CM | POA: Diagnosis present

## 2018-02-16 DIAGNOSIS — Z955 Presence of coronary angioplasty implant and graft: Secondary | ICD-10-CM

## 2018-02-16 DIAGNOSIS — Z9114 Patient's other noncompliance with medication regimen: Secondary | ICD-10-CM

## 2018-02-16 DIAGNOSIS — R0789 Other chest pain: Secondary | ICD-10-CM

## 2018-02-16 DIAGNOSIS — F17210 Nicotine dependence, cigarettes, uncomplicated: Secondary | ICD-10-CM | POA: Diagnosis present

## 2018-02-16 LAB — COMPREHENSIVE METABOLIC PANEL
ALBUMIN: 4.6 g/dL (ref 3.5–5.0)
ALK PHOS: 83 U/L (ref 38–126)
ALT: 40 U/L (ref 14–54)
AST: 33 U/L (ref 15–41)
Anion gap: 8 (ref 5–15)
BILIRUBIN TOTAL: 0.7 mg/dL (ref 0.3–1.2)
BUN: 11 mg/dL (ref 6–20)
CALCIUM: 10 mg/dL (ref 8.9–10.3)
CO2: 29 mmol/L (ref 22–32)
Chloride: 101 mmol/L (ref 101–111)
Creatinine, Ser: 0.79 mg/dL (ref 0.44–1.00)
GFR calc Af Amer: >60 mL/min (ref >60)
GFR calc non Af Amer: >60 mL/min (ref >60)
GLUCOSE: 163 mg/dL — AB (ref 65–99)
Potassium: 3.8 mmol/L (ref 3.5–5.1)
Sodium: 138 mmol/L (ref 135–145)
Total Protein: 8.2 g/dL — ABNORMAL HIGH (ref 6.5–8.1)

## 2018-02-16 LAB — TROPONIN I: Troponin I: <0.03 ng/mL (ref <0.03)

## 2018-02-16 LAB — CBC
HEMATOCRIT: 50.5 % — AB (ref 35.0–47.0)
Hemoglobin: 17.3 g/dL — ABNORMAL HIGH (ref 12.0–16.0)
MCH: 34 pg (ref 26.0–34.0)
MCHC: 34.3 g/dL (ref 32.0–36.0)
MCV: 99.1 fL (ref 80.0–100.0)
Platelets: 231 10*3/uL (ref 150–440)
RBC: 5.1 MIL/uL (ref 3.80–5.20)
RDW: 12.6 % (ref 11.5–14.5)
WBC: 8.1 10*3/uL (ref 3.6–11.0)

## 2018-02-16 MED ORDER — ENOXAPARIN SODIUM 40 MG/0.4ML ~~LOC~~ SOLN
40.0000 mg | SUBCUTANEOUS | Status: DC
  Administered 2018-02-16: 40 mg via SUBCUTANEOUS
  Filled 2018-02-16: qty 0.4

## 2018-02-16 MED ORDER — NITROGLYCERIN 2 % TD OINT
1.0000 [in_us] | TOPICAL_OINTMENT | Freq: Once | TRANSDERMAL | Status: DC
  Filled 2018-02-16: qty 1

## 2018-02-16 MED ORDER — NITROGLYCERIN 0.4 MG SL SUBL
0.4000 mg | SUBLINGUAL_TABLET | SUBLINGUAL | Status: DC | PRN
  Filled 2018-02-16: qty 1

## 2018-02-16 MED ORDER — GABAPENTIN 300 MG PO CAPS
300.0000 mg | ORAL_CAPSULE | Freq: Three times a day (TID) | ORAL | Status: DC
  Administered 2018-02-17 – 2018-02-18 (×4): 300 mg via ORAL
  Filled 2018-02-16 (×4): qty 1

## 2018-02-16 MED ORDER — ONDANSETRON HCL 4 MG/2ML IJ SOLN: 4.0000 mg | Freq: Four times a day (QID) | INTRAMUSCULAR | Status: DC | PRN

## 2018-02-16 MED ORDER — NITROGLYCERIN 2 % TD OINT
2.0000 [in_us] | TOPICAL_OINTMENT | Freq: Four times a day (QID) | TRANSDERMAL | Status: DC
  Administered 2018-02-16 – 2018-02-18 (×6): 2 [in_us] via TOPICAL
  Filled 2018-02-16 (×2): qty 1
  Filled 2018-02-16: qty 2
  Filled 2018-02-16 (×2): qty 1
  Filled 2018-02-16: qty 2
  Filled 2018-02-16: qty 1

## 2018-02-16 MED ORDER — IOPAMIDOL (ISOVUE-370) INJECTION 76%
75.0000 mL | Freq: Once | INTRAVENOUS | Status: AC | PRN
  Administered 2018-02-16: 75 mL via INTRAVENOUS

## 2018-02-16 MED ORDER — ACETAMINOPHEN 325 MG PO TABS
650.0000 mg | ORAL_TABLET | Freq: Four times a day (QID) | ORAL | Status: DC | PRN
  Administered 2018-02-18: 650 mg via ORAL
  Filled 2018-02-16: qty 2

## 2018-02-16 MED ORDER — BISACODYL 10 MG RE SUPP: 10.0000 mg | Freq: Every day | RECTAL | Status: DC | PRN

## 2018-02-16 MED ORDER — DOCUSATE SODIUM 100 MG PO CAPS
100.0000 mg | ORAL_CAPSULE | Freq: Two times a day (BID) | ORAL | Status: DC
  Administered 2018-02-16 – 2018-02-17 (×3): 100 mg via ORAL
  Filled 2018-02-16 (×3): qty 1

## 2018-02-16 MED ORDER — ALUM & MAG HYDROXIDE-SIMETH 200-200-20 MG/5ML PO SUSP: 30.0000 mL | ORAL | Status: DC | PRN

## 2018-02-16 MED ORDER — ONDANSETRON HCL 4 MG PO TABS: 4.0000 mg | ORAL_TABLET | Freq: Four times a day (QID) | ORAL | Status: DC | PRN

## 2018-02-16 MED ORDER — NITROGLYCERIN 0.4 MG SL SUBL: 0.4000 mg | SUBLINGUAL_TABLET | SUBLINGUAL | Status: DC | PRN

## 2018-02-16 MED ORDER — FAMOTIDINE IN NACL 20-0.9 MG/50ML-% IV SOLN
20.0000 mg | Freq: Two times a day (BID) | INTRAVENOUS | Status: DC
  Administered 2018-02-16 – 2018-02-17 (×3): 20 mg via INTRAVENOUS
  Filled 2018-02-16 (×3): qty 50

## 2018-02-16 MED ORDER — ACETAMINOPHEN 650 MG RE SUPP: 650.0000 mg | Freq: Four times a day (QID) | RECTAL | Status: DC | PRN

## 2018-02-16 MED ORDER — INSULIN ASPART 100 UNIT/ML ~~LOC~~ SOLN
0.0000 [IU] | Freq: Three times a day (TID) | SUBCUTANEOUS | Status: DC
  Administered 2018-02-17: 2 [IU] via SUBCUTANEOUS
  Filled 2018-02-16: qty 1

## 2018-02-16 MED ORDER — SODIUM CHLORIDE 0.9 % IV SOLN
INTRAVENOUS | Status: DC
  Administered 2018-02-16: via INTRAVENOUS

## 2018-02-16 MED ORDER — SIMVASTATIN 20 MG PO TABS
40.0000 mg | ORAL_TABLET | Freq: Every day | ORAL | Status: DC
Start: 2018-02-17 — End: 2018-02-18
  Administered 2018-02-17: 40 mg via ORAL
  Filled 2018-02-16: qty 2

## 2018-02-16 MED ORDER — MORPHINE SULFATE (PF) 4 MG/ML IV SOLN
4.0000 mg | INTRAVENOUS | Status: DC | PRN
  Administered 2018-02-16: 4 mg via INTRAVENOUS
  Filled 2018-02-16: qty 1

## 2018-02-16 MED ORDER — VENLAFAXINE HCL ER 75 MG PO CP24
75.0000 mg | ORAL_CAPSULE | Freq: Every day | ORAL | Status: DC
  Administered 2018-02-17 – 2018-02-18 (×2): 75 mg via ORAL
  Filled 2018-02-16 (×2): qty 1

## 2018-02-16 MED ORDER — PROMETHAZINE HCL 25 MG/ML IJ SOLN
12.5000 mg | Freq: Four times a day (QID) | INTRAMUSCULAR | Status: DC | PRN
  Administered 2018-02-16: 12.5 mg via INTRAVENOUS
  Filled 2018-02-16: qty 1

## 2018-02-16 MED ORDER — FENTANYL CITRATE (PF) 100 MCG/2ML IJ SOLN
50.0000 ug | INTRAMUSCULAR | Status: DC | PRN
  Administered 2018-02-16 (×2): 50 ug via INTRAVENOUS
  Filled 2018-02-16 (×2): qty 2

## 2018-02-16 MED ORDER — MORPHINE SULFATE (PF) 2 MG/ML IV SOLN: 2.0000 mg | INTRAVENOUS | Status: DC | PRN

## 2018-02-16 MED ORDER — CLOPIDOGREL BISULFATE 75 MG PO TABS
75.0000 mg | ORAL_TABLET | Freq: Every day | ORAL | Status: DC
  Administered 2018-02-17 – 2018-02-18 (×2): 75 mg via ORAL
  Filled 2018-02-16 (×2): qty 1

## 2018-02-16 MED ORDER — SODIUM CHLORIDE 0.9 % IV BOLUS
500.0000 mL | Freq: Once | INTRAVENOUS | Status: AC
Start: 2018-02-16 — End: 2018-02-16
  Administered 2018-02-16: 500 mL via INTRAVENOUS

## 2018-02-16 MED ORDER — ASPIRIN EC 81 MG PO TBEC
81.0000 mg | DELAYED_RELEASE_TABLET | Freq: Every day | ORAL | Status: DC
  Administered 2018-02-17: 81 mg via ORAL
  Filled 2018-02-16: qty 1

## 2018-02-16 MED ORDER — METOPROLOL TARTRATE 25 MG PO TABS
12.5000 mg | ORAL_TABLET | Freq: Two times a day (BID) | ORAL | Status: DC
  Administered 2018-02-17: 12.5 mg via ORAL
  Filled 2018-02-16 (×2): qty 1

## 2018-02-16 NOTE — H&P (Signed)
History and Physical    Sylvia Wiggins ZOX:[id number] DOB: [birth year] DOA: 02/16/2018  Referring physician: Dr. Roxan Hockey  PCP: Patient, [name]  Specialists: none  Chief Complaint: back pain with EKG changes  HPI: Sylvia Wiggins is a 57 y.o. female has a past medical history significant for CAD s/p stent placement 2 years ago now with recurrent pain between her shoulder blades similar to her sx's 2 years ago. Pain is "crampy and sharp/stabbing". No CP, SON, or N/V. No sweats. Pain comes and goes and is not exertional. EKG in ER abnormal but troponin normal. She is now admitted. No fever  Review of Systems: The patient denies anorexia, fever, weight loss,, vision loss, decreased hearing, hoarseness, chest pain, syncope, dyspnea on exertion, peripheral edema, balance deficits, hemoptysis, abdominal pain, melena, hematochezia, severe indigestion/heartburn, hematuria, incontinence, genital sores, muscle weakness, suspicious skin lesions, transient blindness, difficulty walking, depression, unusual weight change, abnormal bleeding, enlarged lymph nodes, angioedema, and breast masses.   Past Medical History:  Diagnosis Date  . Abscess, gluteal, left    Recurrent drainage, followed by [facility].  . Arthritis   . CAD (coronary artery disease)    Stents in Clogged artery at [facility] June 2014  . Depression   . Hyperlipidemia   . Hypertension   . Myocardial infarction [facility]) June 2014   Past Surgical History:  Procedure Laterality Date  . CARDIAC CATHETERIZATION N/A 08/30/2016   Procedure: Left Heart Cath and Coronary Angiography;  Surgeon: Marcina Millard, MD;  Location: [facility] INVASIVE CV LAB;  Service: Cardiovascular;  Laterality: N/A;  . CARDIAC SURGERY  03/30/2013   stent placement  . RECTAL SURGERY  Anal Fissure   Social History:  reports that she has been smoking cigarettes.  She has been smoking about 0.25 packs per day. She has never used smokeless tobacco. She reports that she does not  drink alcohol or use drugs.  Allergies  Allergen Reactions  . Penicillins Nausea Only    Family History  Problem Relation Age of Onset  . Cancer Mother        colon  . Heart disease Father        Died of Heart Attack    Prior to Admission medications   Medication Sig Start Date End Date Taking? Authorizing Provider  aspirin EC 81 MG tablet Take 81 mg by mouth daily.     [provider]  cholecalciferol (VITAMIN D) 1000 units tablet Take 1,000 Units by mouth daily.    [provider]  clopidogrel (PLAVIX) 75 MG tablet Take 1 tablet (75 mg total) by mouth daily with breakfast. 09/01/16   Hower, Cletis Athens, MD  gabapentin (NEURONTIN) 300 MG capsule Take 300 mg by mouth 3 (three) times daily.     [provider]  metoprolol tartrate (LOPRESSOR) 25 MG tablet Take 0.5 tablets (12.5 mg total) by mouth 2 (two) times daily. 01/26/16   Ellyn Hack, MD  nitroGLYCERIN (NITROSTAT) 0.4 MG SL tablet Place 1 tablet (0.4 mg total) under the tongue every 5 (five) minutes as needed for chest pain. 08/31/16   Hower, Cletis Athens, MD  simvastatin (ZOCOR) 40 MG tablet Take 1 tablet (40 mg total) by mouth daily at 6 PM. 01/26/16   Ellyn Hack, MD  venlafaxine XR (EFFEXOR-XR) 75 MG 24 hr capsule Take 1 capsule (75 mg total) by mouth daily with breakfast. 01/26/16   Ellyn Hack, MD  vitamin B-12 (CYANOCOBALAMIN) 1000 MCG tablet Take 1,000  mcg by mouth daily.    [provider]   Physical Exam: Vitals:   02/16/18 1809 02/16/18 1810 02/16/18 1930 02/16/18 2000  BP: (!) 175/89  (!) 168/90   Pulse: (!) 59  (!) 57 (!) 59  Resp: Temp: 98 F (36.7 C)     TempSrc: Oral     SpO2: 100%  95% 99%  Weight:  68 kg (150 lb)    Height:   (1.575 m)       General:  No apparent distress, WDWN, [place]/AT  Eyes: PERRL, EOMI, no scleral icterus, conjunctiva clear  ENT: moist oropharynx without exudate, TM's benign, dentition fair  Neck: supple, no  lymphadenopathy. No bruits or thyromegaly  Cardiovascular: regular rate without MRG; 2+ peripheral pulses, no JVD, no peripheral edema  Respiratory: CTA biL, good air movement without wheezing, rhonchi or crackled. Respiratory effort is normal  Abdomen: soft, non tender to palpation, positive bowel sounds, no guarding, no rebound  Skin: no rashes or lesions  Musculoskeletal: normal bulk and tone, no joint swelling  Psychiatric: normal mood and affect, A&OX3  Neurologic: CN 2-12 grossly intact, Motor strength 5/5 in all 4 groups with symmetric DTR's and non-focal sensory exam  Labs on Admission:  Basic Metabolic Panel: Recent Labs  Lab 02/16/18 1812  NA 138  K 3.8  CL 101  CO2 29  GLUCOSE 163*  BUN 11  CREATININE 0.79  CALCIUM 10.0   Liver Function Tests: Recent Labs  Lab 02/16/18 1812  AST 33  ALT 40  ALKPHOS 83  BILITOT 0.7  PROT 8.2*  ALBUMIN 4.6   No results for input(s): LIPASE, AMYLASE in the last 168 hours. No results for input(s): AMMONIA in the last 168 hours. CBC: Recent Labs  Lab 02/16/18 1812  WBC 8.1  HGB 17.3*  HCT 50.5*  MCV 99.1  PLT 231   Cardiac Enzymes: Recent Labs  Lab 02/16/18 1812  TROPONINI <0.03    BNP (last 3 results) No results for input(s): BNP in the last 8760 hours.  ProBNP (last 3 results) No results for input(s): PROBNP in the last 8760 hours.  CBG: No results for input(s): GLUCAP in the last 168 hours.  Radiological Exams on Admission: Dg Chest 2 View  Result Date: 02/16/2018 CLINICAL DATA:  Acute shortness of breath and back pain. EXAM: CHEST - 2 VIEW COMPARISON:  08/29/2016 and prior chest radiographs FINDINGS: The cardiomediastinal silhouette is unremarkable. A coronary stent is noted. There is no evidence of focal airspace disease, pulmonary edema, suspicious pulmonary nodule/mass, pleural effusion, or pneumothorax. No acute bony abnormalities are identified. IMPRESSION: No active cardiopulmonary disease.  Electronically Signed   By: Harmon Pier M.D.   On: 02/16/2018 18:45   Ct Angio Chest Pe W And/or Wo Contrast  Result Date: 02/16/2018 CLINICAL DATA:  Chest pain began around 1600 today while resting. EXAM: CT ANGIOGRAPHY CHEST WITH CONTRAST TECHNIQUE: Multidetector CT imaging of the chest was performed using the standard protocol during bolus administration of intravenous contrast. Multiplanar CT image reconstructions and MIPs were obtained to evaluate the vascular anatomy. CONTRAST:  75mL ISOVUE-370 IOPAMIDOL (ISOVUE-370) INJECTION 76% COMPARISON:  Chest x-ray 02/16/2018, chest CT 03/29/2013 FINDINGS: Cardiovascular: Satisfactory opacification of the pulmonary arteries to the segmental level. No evidence of pulmonary embolism. Normal heart size. No pericardial effusion. LEFT coronary artery stent. Mediastinum/Nodes: No enlarged mediastinal, hilar, or axillary lymph nodes. Thyroid gland, trachea, and esophagus demonstrate no significant findings. Lungs/Pleura: Lungs are clear. No  pleural effusion or pneumothorax. Upper Abdomen: Benign LEFT adrenal nodule is 1.7 centimeters. Gallbladder is present. Musculoskeletal: No chest wall abnormality. No acute or significant osseous findings. Review of the MIP images confirms the above findings. IMPRESSION: 1. Technically adequate exam showing no acute pulmonary embolus. 2. LEFT coronary stent. 3. Benign LEFT adrenal nodule. Electronically Signed   By: Norva Pavlov M.D.   On: 02/16/2018 20:02    EKG: Independently reviewed.  Assessment/Plan Principal Problem:   Chest pain, rule out acute myocardial infarction Active Problems:   CAD (coronary artery disease)   HTN (hypertension)   Abnormal EKG   Will observe on telemetry and follow enzymes. Echo ordered. Consult Cardiology Optimize BP regimen and follow sugars  Diet: clear liquids Fluids: NS@75  DVT Prophylaxis: Lovenox  Code Status: FULL  Family Communication: yes  Disposition Plan: home  Time  spent: 50 min

## 2018-02-16 NOTE — ED Notes (Signed)
Admitting Provider at bedside. 

## 2018-02-16 NOTE — ED Triage Notes (Signed)
Chest pain began 2 hours ago while sitting resting.

## 2018-02-16 NOTE — ED Provider Notes (Signed)
[facility] Emergency Department Provider Note    First MD Initiated Contact with Patient 02/16/18 1808     (approximate)  I have reviewed the triage vital signs and the nursing notes.   HISTORY  Chief Complaint Chest Pain    HPI Sylvia Wiggins is a 57 y.o. female with a history of known CAD hypertension, hyperlipidemia and smoking history presents to the ER for posterior back pain like someone stabbing the back of her chest and nausea that started around 4 this afternoon.  Feels just like her last heart attack.  Has not been compliant with her medications for the past several months.  States the pain is also ripping and tearing in her back.  Denies any numbness or tingling.  This pain is identical to when she was previously admitted to the hospital and had stents placed.  Past Medical History:  Diagnosis Date  . Abscess, gluteal, left    Recurrent drainage, followed by [facility].  . Arthritis   . CAD (coronary artery disease)    Stents in Clogged artery at [facility] June 2014  . Depression   . Hyperlipidemia   . Hypertension   . Myocardial infarction [facility]) June 2014   Family History  Problem Relation Age of Onset  . Cancer Mother        colon  . Heart disease Father        Died of Heart Attack   Past Surgical History:  Procedure Laterality Date  . CARDIAC CATHETERIZATION N/A 08/30/2016   Procedure: Left Heart Cath and Coronary Angiography;  Surgeon: Marcina Millard, MD;  Location: [facility] INVASIVE CV LAB;  Service: Cardiovascular;  Laterality: N/A;  . CARDIAC SURGERY  03/30/2013   stent placement  . RECTAL SURGERY  Anal Fissure   Patient Active Problem List   Diagnosis Date Noted  . Chest pain, rule out acute myocardial infarction 08/30/2016  . NSTEMI (non-ST elevated myocardial infarction) (HCC) 08/30/2016  . CAD (coronary artery disease) 01/26/2016  . Hypertension 01/26/2016  . Depression 01/26/2016  . Hyperlipidemia 01/26/2016  . Tremor of  both hands 01/26/2016  . Chronic right-sided low back pain with right-sided sciatica 01/26/2016  . Major depression 10/05/2015      Prior to Admission medications   Medication Sig Start Date End Date Taking? Authorizing Provider  aspirin EC 81 MG tablet Take 81 mg by mouth daily.     [provider]  cholecalciferol (VITAMIN D) 1000 units tablet Take 1,000 Units by mouth daily.    [provider]  clopidogrel (PLAVIX) 75 MG tablet Take 1 tablet (75 mg total) by mouth daily with breakfast. 09/01/16   Hower, Cletis Athens, MD  gabapentin (NEURONTIN) 300 MG capsule Take 300 mg by mouth 3 (three) times daily.     [provider]  metoprolol tartrate (LOPRESSOR) 25 MG tablet Take 0.5 tablets (12.5 mg total) by mouth 2 (two) times daily. 01/26/16   Ellyn Hack, MD  nitroGLYCERIN (NITROSTAT) 0.4 MG SL tablet Place 1 tablet (0.4 mg total) under the tongue every 5 (five) minutes as needed for chest pain. 08/31/16   Hower, Cletis Athens, MD  simvastatin (ZOCOR) 40 MG tablet Take 1 tablet (40 mg total) by mouth daily at 6 PM. 01/26/16   Ellyn Hack, MD  venlafaxine XR (EFFEXOR-XR) 75 MG 24 hr capsule Take 1 capsule (75 mg total) by mouth daily with breakfast. 01/26/16   Ellyn Hack, MD  vitamin B-12 (CYANOCOBALAMIN) 1000  MCG tablet Take 1,000 mcg by mouth daily.    [provider]    Allergies Penicillins    Social History Social History   Tobacco Use  . Smoking status: Current Every Day Smoker    Packs/day: 0.25    Types: Cigarettes  . Smokeless tobacco: Never Used  Substance Use Topics  . Alcohol use: No    Alcohol/week: 0.0 oz  . Drug use: No    Review of Systems Patient denies headaches, rhinorrhea, blurry vision, numbness, shortness of breath, chest pain, edema, cough, abdominal pain, nausea, vomiting, diarrhea, dysuria, fevers, rashes or hallucinations unless otherwise stated above in  HPI. ____________________________________________   PHYSICAL EXAM:  VITAL SIGNS: Vitals:   02/16/18 1930 02/16/18 2000  BP: (!) 168/90   Pulse: (!) 57 (!) 59  Resp: 11 15  Temp:    SpO2: 95% 99%    Constitutional: Alert and oriented. Very anxious appearing but in no acute distress. Eyes: Conjunctivae are normal.  Head: Atraumatic. Nose: No congestion/rhinnorhea. Mouth/Throat: Mucous membranes are moist.   Neck: No stridor. Painless ROM.  Cardiovascular: Normal rate, regular rhythm. Grossly normal heart sounds.  Good peripheral circulation. Respiratory: Normal respiratory effort.  No retractions. Lungs CTAB. Gastrointestinal: Soft and nontender. No distention. No abdominal bruits. No CVA tenderness. Genitourinary:  Musculoskeletal: No lower extremity tenderness nor edema.  No joint effusions. Neurologic:  Normal speech and language. No gross focal neurologic deficits are appreciated. No facial droop Skin:  Skin is warm, dry and intact. No rash noted. Psychiatric: . Speech and behavior are normal.  ____________________________________________   LABS (all labs ordered are listed, but only abnormal results are displayed)  Results for orders placed or performed during the hospital encounter of 02/16/18 (from the past 24 hour(s))  CBC     Status: Abnormal   Collection Time: 02/16/18  6:12 PM  Result Value Ref Range   WBC 8.1 3.6 - 11.0 K/uL   RBC 5.10 3.80 - 5.20 MIL/uL   Hemoglobin 17.3 (H) 12.0 - 16.0 g/dL   HCT 40.9 (H) 81.1 - 91.4 %   MCV 99.1 80.0 - 100.0 fL   MCH 34.0 26.0 - 34.0 pg   MCHC 34.3 32.0 - 36.0 g/dL   RDW 78.2 95.6 - 21.3 %   Platelets 231 150 - 440 K/uL  Troponin I     Status: None   Collection Time: 02/16/18  6:12 PM  Result Value Ref Range   Troponin I <0.03 <0.03 ng/mL  Comprehensive metabolic panel     Status: Abnormal   Collection Time: 02/16/18  6:12 PM  Result Value Ref Range   Sodium 138 135 - 145 mmol/L   Potassium 3.8 3.5 - 5.1 mmol/L    Chloride 101 101 - 111 mmol/L   CO2 29 22 - 32 mmol/L   Glucose, Bld 163 (H) 65 - 99 mg/dL   BUN 11 6 - 20 mg/dL   Creatinine, Ser 0.86 0.44 - 1.00 mg/dL   Calcium 57.8 8.9 - 46.9 mg/dL   Total Protein 8.2 (H) 6.5 - 8.1 g/dL   Albumin 4.6 3.5 - 5.0 g/dL   AST 33 15 - 41 U/L   ALT 40 14 - 54 U/L   Alkaline Phosphatase 83 38 - 126 U/L   Total Bilirubin 0.7 0.3 - 1.2 mg/dL   GFR calc non Af Amer >60 >60 mL/min   GFR calc Af Amer >60 >60 mL/min   Anion gap 8 5 - 15   ____________________________________________  EKG My review and personal interpretation at Time: 6:32   Indication: chest pain  Rate: 60  Rhythm: sinus Axis: normal Other: t wave inversions in V2 and V3 new as compared to previous, no stemi ____________________________________________  RADIOLOGY  I personally reviewed all radiographic images ordered to evaluate for the above acute complaints and reviewed radiology reports and findings.  These findings were personally discussed with the patient.  Please see medical record for radiology report.  ____________________________________________   PROCEDURES  Procedure(s) performed:  Procedures    Critical Care performed: no ____________________________________________   INITIAL IMPRESSION / ASSESSMENT AND PLAN / ED COURSE  Pertinent labs & imaging results that were available during my care of the patient were reviewed by me and considered in my medical decision making (see chart for details).  DDX: ACS, pericarditis, esophagitis, boerhaaves, pe, dissection, pna, bronchitis, costochondritis   Sylvia Wiggins is a 57 y.o. who presents to the ED with atypical chest pain and back pain as described above.  EKG does show some nonspecific changes as compared to previous.  Initial troponin is negative.  Based on her back pain and hypertension I am more clinically concerned for dissection or aneurysm therefore CT imaging ordered for the above differential.  No abdominal pain  to suggest intra-abdominal process.  Will give IV pain medication.  Did not have any significant change in symptoms with nitroglycerin.   Clinical Course as of Feb 17 2104  Sat Feb 16, 2018  2100 CTA does not show evidence of dissection or aneurysm.  No evidence of PE to explain the patient's pain.  Symptoms remain atypical and patient still having spasms and discomfort.  Clinically seems less consistent with ACS but given her risk factors history, noncompliance and limited follow-up with persistent pain despite medical therapies in the ER do feel patient will require hospitalization for further chest pain rule out.   [PR]  2101 POC urine preg, ED [PR]    Clinical Course User Index [PR] Willy Eddy, MD      As part of my medical decision making, I reviewed the following data within the electronic MEDICAL RECORD NUMBER Nursing notes reviewed and incorporated, Labs reviewed, notes from prior ED visits and [place] Controlled Substance Database   ____________________________________________   FINAL CLINICAL IMPRESSION(S) / ED DIAGNOSES  Final diagnoses:  Atypical chest pain  Hypertension, unspecified type      NEW MEDICATIONS STARTED DURING THIS VISIT:  New Prescriptions   No medications on file     Note:  This document was prepared using Dragon voice recognition software and may include unintentional dictation errors.    Willy Eddy, MD 02/16/18 2105

## 2018-02-16 NOTE — ED Notes (Signed)
Med IV for pain, exam by MD.

## 2018-02-16 NOTE — Progress Notes (Signed)
Called Pharmacy tech that do the the PTA med list, per tech they will review it in the morning.

## 2018-02-16 NOTE — ED Notes (Signed)
RN to bedside to administer medications. Patient at CT 

## 2018-02-17 ENCOUNTER — Observation Stay
Admit: 2018-02-17 | Discharge: 2018-02-17 | Disposition: A | Discharge status: Home / Self Care | Payer: Medicaid Other | Attending: Internal Medicine | Admitting: Internal Medicine

## 2018-02-17 DIAGNOSIS — E782 Mixed hyperlipidemia: Secondary | ICD-10-CM | POA: Diagnosis present

## 2018-02-17 DIAGNOSIS — I252 Old myocardial infarction: Secondary | ICD-10-CM | POA: Diagnosis not present

## 2018-02-17 DIAGNOSIS — Z9114 Patient's other noncompliance with medication regimen: Secondary | ICD-10-CM | POA: Diagnosis not present

## 2018-02-17 DIAGNOSIS — Z955 Presence of coronary angioplasty implant and graft: Secondary | ICD-10-CM | POA: Diagnosis not present

## 2018-02-17 DIAGNOSIS — F17210 Nicotine dependence, cigarettes, uncomplicated: Secondary | ICD-10-CM | POA: Diagnosis present

## 2018-02-17 DIAGNOSIS — F329 Major depressive disorder, single episode, unspecified: Secondary | ICD-10-CM | POA: Diagnosis present

## 2018-02-17 DIAGNOSIS — R0789 Other chest pain: Secondary | ICD-10-CM | POA: Diagnosis present

## 2018-02-17 DIAGNOSIS — I10 Essential (primary) hypertension: Secondary | ICD-10-CM | POA: Diagnosis present

## 2018-02-17 DIAGNOSIS — E119 Type 2 diabetes mellitus without complications: Secondary | ICD-10-CM | POA: Diagnosis present

## 2018-02-17 DIAGNOSIS — Z880 Allergy status to penicillin: Secondary | ICD-10-CM | POA: Diagnosis not present

## 2018-02-17 DIAGNOSIS — I214 Non-ST elevation (NSTEMI) myocardial infarction: Secondary | ICD-10-CM | POA: Diagnosis present

## 2018-02-17 DIAGNOSIS — I25119 Atherosclerotic heart disease of native coronary artery with unspecified angina pectoris: Secondary | ICD-10-CM | POA: Diagnosis present

## 2018-02-17 DIAGNOSIS — Z7982 Long term (current) use of aspirin: Secondary | ICD-10-CM | POA: Diagnosis not present

## 2018-02-17 DIAGNOSIS — R9431 Abnormal electrocardiogram [ECG] [EKG]: Secondary | ICD-10-CM | POA: Diagnosis present

## 2018-02-17 DIAGNOSIS — M1990 Unspecified osteoarthritis, unspecified site: Secondary | ICD-10-CM | POA: Diagnosis present

## 2018-02-17 DIAGNOSIS — Z7902 Long term (current) use of antithrombotics/antiplatelets: Secondary | ICD-10-CM | POA: Diagnosis not present

## 2018-02-17 DIAGNOSIS — Z79899 Other long term (current) drug therapy: Secondary | ICD-10-CM | POA: Diagnosis not present

## 2018-02-17 LAB — COMPREHENSIVE METABOLIC PANEL
ALBUMIN: 3.5 g/dL (ref 3.5–5.0)
ALK PHOS: 68 U/L (ref 38–126)
ALT: 32 U/L (ref 14–54)
ANION GAP: 5 (ref 5–15)
AST: 40 U/L (ref 15–41)
BUN: 9 mg/dL (ref 6–20)
CHLORIDE: 107 mmol/L (ref 101–111)
CO2: 27 mmol/L (ref 22–32)
Calcium: 8.6 mg/dL — ABNORMAL LOW (ref 8.9–10.3)
Creatinine, Ser: 0.68 mg/dL (ref 0.44–1.00)
GFR calc non Af Amer: >60 mL/min (ref >60)
GLUCOSE: 125 mg/dL — AB (ref 65–99)
POTASSIUM: 4 mmol/L (ref 3.5–5.1)
SODIUM: 139 mmol/L (ref 135–145)
Total Bilirubin: 0.6 mg/dL (ref 0.3–1.2)
Total Protein: 6.2 g/dL — ABNORMAL LOW (ref 6.5–8.1)

## 2018-02-17 LAB — TROPONIN I
TROPONIN I: 0.14 ng/mL — AB (ref <0.03)
TROPONIN I: 0.83 ng/mL — AB (ref <0.03)
Troponin I: 0.44 ng/mL (ref <0.03)

## 2018-02-17 LAB — CBC
HEMATOCRIT: 43.6 % (ref 35.0–47.0)
HEMOGLOBIN: 15.1 g/dL (ref 12.0–16.0)
MCH: 34.6 pg — AB (ref 26.0–34.0)
MCHC: 34.7 g/dL (ref 32.0–36.0)
MCV: 99.9 fL (ref 80.0–100.0)
Platelets: 172 10*3/uL (ref 150–440)
RBC: 4.37 MIL/uL (ref 3.80–5.20)
RDW: 12.7 % (ref 11.5–14.5)
WBC: 6.3 10*3/uL (ref 3.6–11.0)

## 2018-02-17 LAB — GLUCOSE, CAPILLARY
GLUCOSE-CAPILLARY: 140 mg/dL — AB (ref 65–99)
Glucose-Capillary: 132 mg/dL — ABNORMAL HIGH (ref 65–99)
Glucose-Capillary: 158 mg/dL — ABNORMAL HIGH (ref 65–99)
Glucose-Capillary: 98 mg/dL (ref 65–99)

## 2018-02-17 MED ORDER — ASPIRIN 81 MG PO CHEW
81.0000 mg | CHEWABLE_TABLET | ORAL | Status: AC
  Administered 2018-02-18: 81 mg via ORAL
  Filled 2018-02-17: qty 1

## 2018-02-17 MED ORDER — SODIUM CHLORIDE 0.9 % IV SOLN: 250.0000 mL | INTRAVENOUS | Status: DC | PRN

## 2018-02-17 MED ORDER — SODIUM CHLORIDE 0.9% FLUSH: 3.0000 mL | INTRAVENOUS | Status: DC | PRN

## 2018-02-17 MED ORDER — SODIUM CHLORIDE 0.9% FLUSH: 3.0000 mL | Freq: Two times a day (BID) | INTRAVENOUS | Status: DC

## 2018-02-17 MED ORDER — ENOXAPARIN SODIUM 80 MG/0.8ML ~~LOC~~ SOLN
1.0000 mg/kg | Freq: Two times a day (BID) | SUBCUTANEOUS | Status: DC
  Administered 2018-02-17 – 2018-02-18 (×2): 70 mg via SUBCUTANEOUS
  Filled 2018-02-17 (×2): qty 0.8

## 2018-02-17 MED ORDER — SODIUM CHLORIDE 0.9 % WEIGHT BASED INFUSION: 1.0000 mL/kg/h | INTRAVENOUS | Status: DC

## 2018-02-17 MED ORDER — SODIUM CHLORIDE 0.9 % WEIGHT BASED INFUSION
3.0000 mL/kg/h | INTRAVENOUS | Status: DC
  Administered 2018-02-18: 3 mL/kg/h via INTRAVENOUS

## 2018-02-17 NOTE — Progress Notes (Signed)
Spoke with Dr Gwen Pounds regarding pt elevated troponin this a.m., he states pt will have cath tomorrow

## 2018-02-17 NOTE — Consult Note (Signed)
[facility] Cardiology Consultation Note  Patient ID: Sylvia Wiggins, MRN: [id number], DOB/AGE: 02/04/1961 57 y.o. Admit date: 02/16/2018   Date of Consult: 02/17/2018 Primary Physician: Patient, [name] Primary Cardiologist: Gwen Pounds  Chief Complaint:  Chief Complaint  Patient presents with  . Chest Pain   Reason for Consult: Back pain with known coronary artery disease  HPI: 57 y.o. female with known coronary artery disease status post mild anterior myocardial infarction PCI and stent placement in 2014 for which the patient was placed on appropriate medication management.  The patient has done fairly well with that appropriate medication management for the last several years.  Last year she had a surgical intervention of her back for which she performed well.  She is not very active at this time not doing many activities whatsoever.  She was cleaning her tub with some Clorox solutions and working vigorously throughout the afternoon and had no evidence of significant symptoms.  Later that evening she had some significant central upper back symptoms for which she equates to similar symptoms from her previous myocardial infarction and/or stent placement.  The patient had relief with some nitrates but then had some recurrence.  When seen in the emergency room she felt much better.  She did have an EKG showing normal sinus rhythm with septal myocardial infarction old and unchanged from EKG from 2017.  She did have a initially normal troponin but now has a troponin of 0.14.  As of further symptoms at this time and is on appropriate medication management for further risk reduction cardiovascular disease including high intensity cholesterol therapy hypertension control with metoprolol and dual antiplatelet therapy.  Her current symptoms may be anginal equivalent and/or symptoms from using heavy doses of Clorox  Past Medical History:  Diagnosis Date  . Abscess, gluteal, left    Recurrent drainage,  followed by [facility].  . Arthritis   . CAD (coronary artery disease)    Stents in Clogged artery at [facility] June 2014  . Depression   . Hyperlipidemia   . Hypertension   . Myocardial infarction [facility]) June 2014      Surgical History:  Past Surgical History:  Procedure Laterality Date  . CARDIAC CATHETERIZATION N/A 08/30/2016   Procedure: Left Heart Cath and Coronary Angiography;  Surgeon: Marcina Millard, MD;  Location: [facility] INVASIVE CV LAB;  Service: Cardiovascular;  Laterality: N/A;  . CARDIAC SURGERY  03/30/2013   stent placement  . RECTAL SURGERY  Anal Fissure     Home Meds: Prior to Admission medications   Medication Sig Start Date End Date Taking? Authorizing Provider  aspirin EC 81 MG tablet Take 81 mg by mouth daily.     [provider]  cholecalciferol (VITAMIN D) 1000 units tablet Take 1,000 Units by mouth daily.    [provider]  clopidogrel (PLAVIX) 75 MG tablet Take 1 tablet (75 mg total) by mouth daily with breakfast. 09/01/16   Hower, Cletis Athens, MD  gabapentin (NEURONTIN) 300 MG capsule Take 300 mg by mouth 3 (three) times daily.     [provider]  metoprolol tartrate (LOPRESSOR) 25 MG tablet Take 0.5 tablets (12.5 mg total) by mouth 2 (two) times daily. 01/26/16   Ellyn Hack, MD  nitroGLYCERIN (NITROSTAT) 0.4 MG SL tablet Place 1 tablet (0.4 mg total) under the tongue every 5 (five) minutes as needed for chest pain. 08/31/16   Hower, Cletis [place], MD  simvastatin (ZOCOR) 40 MG tablet Take 1 tablet (40 mg  total) by mouth daily at 6 PM. 01/26/16   Ellyn Hack, MD  venlafaxine XR (EFFEXOR-XR) 75 MG 24 hr capsule Take 1 capsule (75 mg total) by mouth daily with breakfast. 01/26/16   Ellyn Hack, MD  vitamin B-12 (CYANOCOBALAMIN) 1000 MCG tablet Take 1,000 mcg by mouth daily.    [provider]    Inpatient Medications:  . aspirin EC  81 mg Oral Daily  . clopidogrel  75 mg Oral Q breakfast  . docusate sodium  100 mg Oral BID   . enoxaparin (LOVENOX) injection  40 mg Subcutaneous Q24H  . gabapentin  300 mg Oral TID  . insulin aspart  0-9 Units Subcutaneous TID WC  . metoprolol tartrate  12.5 mg Oral BID  . nitroGLYCERIN  2 inch Topical Q6H  . simvastatin  40 mg Oral q1800  . venlafaxine XR  75 mg Oral Q breakfast   . sodium chloride 75 mL/hr at 02/16/18 2337  . famotidine (PEPCID) IV Stopped (02/17/18 0207)    Allergies:  Allergies  Allergen Reactions  . Penicillins Nausea Only    Social History   Socioeconomic History  . Marital status: Married    Spouse name: Not on file  . Number of children: Not on file  . Years of education: Not on file  . Highest education level: Not on file  Occupational History  . Not on file  Social Needs  . Financial resource strain: Not on file  . Food insecurity:    Worry: Not on file    Inability: Not on file  . Transportation needs:    Medical: Not on file    Non-medical: Not on file  Tobacco Use  . Smoking status: Current Every Day Smoker    Packs/day: 0.25    Types: Cigarettes  . Smokeless tobacco: Never Used  Substance and Sexual Activity  . Alcohol use: No    Alcohol/week: 0.0 oz  . Drug use: No  . Sexual activity: Never  Lifestyle  . Physical activity:    Days per week: Not on file    Minutes per session: Not on file  . Stress: Not on file  Relationships  . Social connections:    Talks on phone: Not on file    Gets together: Not on file    Attends religious service: Not on file    Active member of club or organization: Not on file    Attends meetings of clubs or organizations: Not on file    Relationship status: Not on file  . Intimate partner violence:    Fear of current or ex partner: Not on file    Emotionally abused: Not on file    Physically abused: Not on file    Forced sexual activity: Not on file  Other Topics Concern  . Not on file  Social History Narrative  . Not on file     Family History  Problem Relation Age of Onset   . Cancer Mother        colon  . Heart disease Father        Died of Heart Attack     Review of Systems Positive for upper back pain Negative for: General:  chills, fever, night sweats or weight changes.  Cardiovascular: PND orthopnea syncope dizziness  Dermatological skin lesions rashes Respiratory: Cough congestion Urologic: Frequent urination urination at night and hematuria Abdominal: negative for nausea, vomiting, diarrhea, bright red blood per rectum, melena, or hematemesis Neurologic: negative  for visual changes, and/or hearing changes  All other systems reviewed and are otherwise negative except as noted above.  Labs: Recent Labs    02/16/18 1812 02/17/18 0016  TROPONINI <0.03 0.14*   Lab Results  Component Value Date   WBC 6.3 02/17/2018   HGB 15.1 02/17/2018   HCT 43.6 02/17/2018   MCV 99.9 02/17/2018   PLT 172 02/17/2018    Recent Labs  Lab 02/16/18 1812  NA 138  K 3.8  CL 101  CO2 29  BUN 11  CREATININE 0.79  CALCIUM 10.0  PROT 8.2*  BILITOT 0.7  ALKPHOS 83  ALT 40  AST 33  GLUCOSE 163*   Lab Results  Component Value Date   CHOL 190 08/30/2016   HDL 33 (L) 08/30/2016   LDLCALC UNABLE TO CALCULATE IF TRIGLYCERIDE OVER 400 mg/dL 40/98/1191   TRIG 478 (H) 08/30/2016   No results found for: DDIMER  Radiology/Studies:  Dg Chest 2 View  Result Date: 02/16/2018 CLINICAL DATA:  Acute shortness of breath and back pain. EXAM: CHEST - 2 VIEW COMPARISON:  08/29/2016 and prior chest radiographs FINDINGS: The cardiomediastinal silhouette is unremarkable. A coronary stent is noted. There is no evidence of focal airspace disease, pulmonary edema, suspicious pulmonary nodule/mass, pleural effusion, or pneumothorax. No acute bony abnormalities are identified. IMPRESSION: No active cardiopulmonary disease. Electronically Signed   By: Harmon Pier M.D.   On: 02/16/2018 18:45   Ct Angio Chest Pe W And/or Wo Contrast  Result Date: 02/16/2018 CLINICAL DATA:   Chest pain began around 1600 today while resting. EXAM: CT ANGIOGRAPHY CHEST WITH CONTRAST TECHNIQUE: Multidetector CT imaging of the chest was performed using the standard protocol during bolus administration of intravenous contrast. Multiplanar CT image reconstructions and MIPs were obtained to evaluate the vascular anatomy. CONTRAST:  75mL ISOVUE-370 IOPAMIDOL (ISOVUE-370) INJECTION 76% COMPARISON:  Chest x-ray 02/16/2018, chest CT 03/29/2013 FINDINGS: Cardiovascular: Satisfactory opacification of the pulmonary arteries to the segmental level. No evidence of pulmonary embolism. Normal heart size. No pericardial effusion. LEFT coronary artery stent. Mediastinum/Nodes: No enlarged mediastinal, hilar, or axillary lymph nodes. Thyroid gland, trachea, and esophagus demonstrate no significant findings. Lungs/Pleura: Lungs are clear. No pleural effusion or pneumothorax. Upper Abdomen: Benign LEFT adrenal nodule is 1.7 centimeters. Gallbladder is present. Musculoskeletal: No chest wall abnormality. No acute or significant osseous findings. Review of the MIP images confirms the above findings. IMPRESSION: 1. Technically adequate exam showing no acute pulmonary embolus. 2. LEFT coronary stent. 3. Benign LEFT adrenal nodule. Electronically Signed   By: Norva Pavlov M.D.   On: 02/16/2018 20:02    EKG: Normal sinus rhythm with old septal myocardial infarction  Weights: Filed Weights   02/16/18 1810 02/16/18 2204 02/17/18 0456  Weight: 150 lb (68 kg) 152 lb 1.6 oz (69 kg) 152 lb 4.8 oz (69.1 kg)     Physical Exam: Blood pressure 103/68, pulse (!) 56, temperature 97.8 F (36.6 C), temperature source Oral, resp. rate 17, height  (1.575 m), weight 152 lb 4.8 oz (69.1 kg), SpO2 95 %. Body mass index is 27.86 kg/m. General: Well developed, well nourished, in no acute distress. Head eyes ears nose throat: Normocephalic, atraumatic, sclera non-icteric, no xanthomas, nares are without discharge. No apparent  thyromegaly and/or mass  Lungs: Normal respiratory effort.  no wheezes, no rales, no rhonchi.  Heart: RRR with normal S1 S2. no murmur gallop, no rub, PMI is normal size and placement, carotid upstroke normal without bruit, jugular venous pressure is normal  Abdomen: Soft, non-tender, non-distended with normoactive bowel sounds. No hepatomegaly. No rebound/guarding. No obvious abdominal masses. Abdominal aorta is normal size without bruit Extremities: No edema. no cyanosis, no clubbing, no ulcers  Peripheral : 2+ bilateral upper extremity pulses, 2+ bilateral femoral pulses, 2+ bilateral dorsal pedal pulse Neuro: Alert and oriented. No facial asymmetry. No focal deficit. Moves all extremities spontaneously. Musculoskeletal: Normal muscle tone without kyphosis Psych:  Responds to questions appropriately with a normal affect.    Assessment: 57 year old female with known previous myocardial infarction PCI and stent placement essential hypertension mixed hyperlipidemia diabetes on appropriate medication management with upper back pain which may be anginal equivalent with slight elevation of troponin  Plan: 1.  Continue serial ECG and enzymes to assess for possible myocardial infarction with further analysis of trend of troponin 2.  Any nitrates for apparent possible anginal equivalent 3.  Metoprolol and possible ACE inhibitor for further hypertension control and risk reduction of cardiovascular event 4.  Continue high intensity cholesterol therapy 5.  No change in dual antiplatelet therapy 6.  In ambulation and follow for improvements of symptoms and possible discharge to home if ambulating well with no evidence of further change in troponin level.  If patient has further symptoms as well as concerns with a troponin level changing then would proceed to cardiac catheterization.  Patient understands risk and benefits of cardiac catheterization.  This includes a possibility of death stroke heart attack  infection bleeding or blood clot.  She is at low risk for conscious sedation  Signed, Lamar Blinks M.D. [facility] 02/17/2018, 7:30 AM

## 2018-02-17 NOTE — Progress Notes (Signed)
[employer] [facility] [place] [facility]   PATIENT NAME: Sylvia Wiggins    MR#:  [id number]  DATE OF BIRTH:  06-15-1961  SUBJECTIVE:  CHIEF COMPLAINT:   Chief Complaint  Patient presents with  . Chest Pain   History of coronary stent in 2016, came with chest pain radiating to the back.  Troponin gradually rising, currently no chest pain.  REVIEW OF SYSTEMS:  CONSTITUTIONAL: No fever, fatigue or weakness.  EYES: No blurred or double vision.  EARS, NOSE, AND THROAT: No tinnitus or ear pain.  RESPIRATORY: No cough, shortness of breath, wheezing or hemoptysis.  CARDIOVASCULAR: No chest pain, orthopnea, edema.  GASTROINTESTINAL: No nausea, vomiting, diarrhea or abdominal pain.  GENITOURINARY: No dysuria, hematuria.  ENDOCRINE: No polyuria, nocturia,  HEMATOLOGY: No anemia, easy bruising or bleeding SKIN: No rash or lesion. MUSCULOSKELETAL: No joint pain or arthritis.   NEUROLOGIC: No tingling, numbness, weakness.  PSYCHIATRY: No anxiety or depression.   ROS  DRUG ALLERGIES:   Allergies  Allergen Reactions  . Penicillins Nausea Only    VITALS:  Blood pressure 112/72, pulse (!) 57, temperature 97.6 F (36.4 C), temperature source Oral, resp. rate 14, height  (1.575 m), weight 69.1 kg (152 lb 4.8 oz), SpO2 99 %.  PHYSICAL EXAMINATION:  GENERAL:  57 y.o.-year-old patient lying in the bed with no acute distress.  EYES: Pupils equal, round, reactive to light and accommodation. No scleral icterus. Extraocular muscles intact.  HEENT: Head atraumatic, normocephalic. Oropharynx and nasopharynx clear.  NECK:  Supple, no jugular venous distention. No thyroid enlargement, no tenderness.  LUNGS: Normal breath sounds bilaterally, no wheezing, rales,rhonchi or crepitation. No use of accessory muscles of respiration.  CARDIOVASCULAR: S1, S2 normal. No murmurs, rubs, or gallops.  ABDOMEN: Soft, nontender, nondistended. Bowel sounds present. No organomegaly or mass.   EXTREMITIES: No pedal edema, cyanosis, or clubbing.  NEUROLOGIC: Cranial nerves II through XII are intact. Muscle strength 5/5 in all extremities. Sensation intact. Gait not checked.  PSYCHIATRIC: The patient is alert and oriented x 3.  SKIN: No obvious rash, lesion, or ulcer.   Physical Exam LABORATORY PANEL:   CBC Recent Labs  Lab 02/17/18 0625  WBC 6.3  HGB 15.1  HCT 43.6  PLT 172   ------------------------------------------------------------------------------------------------------------------  Chemistries  Recent Labs  Lab 02/17/18 0625  NA 139  K 4.0  CL 107  CO2 27  GLUCOSE 125*  BUN 9  CREATININE 0.68  CALCIUM 8.6*  AST 40  ALT 32  ALKPHOS 68  BILITOT 0.6   ------------------------------------------------------------------------------------------------------------------  Cardiac Enzymes Recent Labs  Lab 02/17/18 0625 02/17/18 1324  TROPONINI 0.44* 0.83*   ------------------------------------------------------------------------------------------------------------------  RADIOLOGY:  Dg Chest 2 View  Result Date: 02/16/2018 CLINICAL DATA:  Acute shortness of breath and back pain. EXAM: CHEST - 2 VIEW COMPARISON:  08/29/2016 and prior chest radiographs FINDINGS: The cardiomediastinal silhouette is unremarkable. A coronary stent is noted. There is no evidence of focal airspace disease, pulmonary edema, suspicious pulmonary nodule/mass, pleural effusion, or pneumothorax. No acute bony abnormalities are identified. IMPRESSION: No active cardiopulmonary disease. Electronically Signed   By: Harmon Pier M.D.   On: 02/16/2018 18:45   Ct Angio Chest Pe W And/or Wo Contrast  Result Date: 02/16/2018 CLINICAL DATA:  Chest pain began around 1600 today while resting. EXAM: CT ANGIOGRAPHY CHEST WITH CONTRAST TECHNIQUE: Multidetector CT imaging of the chest was performed using the standard protocol during bolus administration of intravenous contrast. Multiplanar CT image  reconstructions and MIPs were obtained  to evaluate the vascular anatomy. CONTRAST:  75mL ISOVUE-370 IOPAMIDOL (ISOVUE-370) INJECTION 76% COMPARISON:  Chest x-ray 02/16/2018, chest CT 03/29/2013 FINDINGS: Cardiovascular: Satisfactory opacification of the pulmonary arteries to the segmental level. No evidence of pulmonary embolism. Normal heart size. No pericardial effusion. LEFT coronary artery stent. Mediastinum/Nodes: No enlarged mediastinal, hilar, or axillary lymph nodes. Thyroid gland, trachea, and esophagus demonstrate no significant findings. Lungs/Pleura: Lungs are clear. No pleural effusion or pneumothorax. Upper Abdomen: Benign LEFT adrenal nodule is 1.7 centimeters. Gallbladder is present. Musculoskeletal: No chest wall abnormality. No acute or significant osseous findings. Review of the MIP images confirms the above findings. IMPRESSION: 1. Technically adequate exam showing no acute pulmonary embolus. 2. LEFT coronary stent. 3. Benign LEFT adrenal nodule. Electronically Signed   By: Norva Pavlov M.D.   On: 02/16/2018 20:02    ASSESSMENT AND PLAN:   Principal Problem:   Chest pain, rule out acute myocardial infarction Active Problems:   CAD (coronary artery disease)   HTN (hypertension)   Abnormal EKG   Chest pain  * Non-ST elevation MI   Patient has a history of CAD and stent.   Continue cardiac medications.   Lovenox subcutaneous injections for now and appreciated her prior cardiologist.   Plan for cardiac catheterization tomorrow    Continue aspirin, Plavix, statin, metoprolol.  * Hypertension   Continue metoprolol.  * Hyperlipidemia   Continue simvastatin.     All the records are reviewed and case discussed with Care Management/Social Workerr. Management plans discussed with the patient, family and they are in agreement.  CODE STATUS: full.  TOTAL TIME TAKING CARE OF THIS PATIENT: 35 minutes.     POSSIBLE D/C IN 1-2 DAYS, DEPENDING ON CLINICAL  CONDITION.   Altamese Dilling M.D on 02/17/2018   Between 7am to 6pm - Pager - [phone]  After 6pm go to [email or web address] - password [email or web address]  [facility] [place] [facility]  Office  [phone]  CC: Primary care physician; Patient, [name]  Note: This dictation was prepared with Dragon dictation along with smaller phrase technology. Any transcriptional errors that result from this process are unintentional.

## 2018-02-17 NOTE — Progress Notes (Signed)
CRITICAL VALUE ALERT  Critical Value: Troponin 0.83  Date & Time Notied:  02/17/2018  Provider Notified: Dr. Elisabeth Pigeon  Orders Received/Actions taken: Patient asymptomatic. Patient has schedule cardiac cath tomorrow. Patient on lovenox for anticoagulation. RN will continue to monitor.

## 2018-02-17 NOTE — Progress Notes (Signed)
CRITICAL VALUE ALERT  Critical Value:  Troponin 0.44  Date & Time Notied:  02/17/2018, 0750  Provider Notified: Jen Mow paged  Orders Received/Actions taken: none at this time

## 2018-02-17 NOTE — Progress Notes (Signed)
Notified Dr. Stacie Acres of troponin of 0.14.  NO new orders.

## 2018-02-17 NOTE — Progress Notes (Signed)
ANTICOAGULATION CONSULT NOTE - Initial Consult  Pharmacy Consult for Lovenox Indication: chest pain/ACS and NSTEMI  Allergies  Allergen Reactions  . Penicillins Nausea Only    Patient Measurements: Height:  (157.5 cm) Weight: 152 lb 4.8 oz (69.1 kg) IBW/kg (Calculated) : 50.1 Heparin Dosing Weight:    Vital Signs: Temp: 97.6 F (36.4 C) (05/12 0858) Temp Source: Oral (05/12 0858) BP: 112/72 (05/12 0858) Pulse Rate: 57 (05/12 0858)  Labs: Recent Labs    02/16/18 1812 02/17/18 0016 02/17/18 0625  HGB 17.3*  --  15.1  HCT 50.5*  --  43.6  PLT 231  --  172  CREATININE 0.79  --  0.68  TROPONINI <0.03 0.14* 0.44*    Estimated Creatinine Clearance: 69.8 mL/min (by C-G formula based on SCr of 0.68 mg/dL).   Medical History: Past Medical History:  Diagnosis Date  . Abscess, gluteal, left    Recurrent drainage, followed by [facility]  . Arthritis   . CAD (coronary artery disease)    Stents in Clogged artery at [facility] June 2014  . Depression   . Hyperlipidemia   . Hypertension   . Myocardial infarction [facility]) June 2014    Assessment: Patient is 57yo female admitted with back pain and h/o of CAD. Pharmacy consulted for therapeutic enoxaparin dosing.   Plan:  Lovenox  ( /kg) SQ q12h  Clovia Cuff, PharmD, BCPS 02/17/2018 11:54 AM

## 2018-02-17 NOTE — Progress Notes (Signed)
*  PRELIMINARY RESULTS* Echocardiogram 2D Echocardiogram has been performed.  Garrel Ridgel Corday Wyka 02/17/2018, 9:32 AM

## 2018-02-18 ENCOUNTER — Encounter
Admission: EM | Disposition: A | Discharge status: Home / Self Care | Payer: Self-pay | Source: Home / Self Care | Attending: Internal Medicine

## 2018-02-18 ENCOUNTER — Encounter: Payer: Self-pay | Admitting: Certified Registered Nurse Anesthetist

## 2018-02-18 ENCOUNTER — Encounter: Payer: Self-pay | Admitting: Cardiology

## 2018-02-18 HISTORY — PX: LEFT HEART CATH AND CORS/GRAFTS ANGIOGRAPHY: CATH118250

## 2018-02-18 LAB — PROTIME-INR
INR: 0.98
Prothrombin Time: 12.9 seconds (ref 11.4–15.2)

## 2018-02-18 LAB — ECHOCARDIOGRAM COMPLETE
Height: 62 in
Weight: 2436.8 oz

## 2018-02-18 LAB — GLUCOSE, CAPILLARY: GLUCOSE-CAPILLARY: 119 mg/dL — AB (ref 65–99)

## 2018-02-18 LAB — HIV ANTIBODY (ROUTINE TESTING W REFLEX): HIV SCREEN 4TH GENERATION: NONREACTIVE

## 2018-02-18 SURGERY — LEFT HEART CATH AND CORS/GRAFTS ANGIOGRAPHY
Anesthesia: Moderate Sedation

## 2018-02-18 MED ORDER — ONDANSETRON HCL 4 MG/2ML IJ SOLN: 4.0000 mg | Freq: Four times a day (QID) | INTRAMUSCULAR | Status: DC | PRN

## 2018-02-18 MED ORDER — SODIUM CHLORIDE 0.9% FLUSH
3.0000 mL | INTRAVENOUS | Status: DC | PRN
Start: 2018-02-18 — End: 2018-02-18

## 2018-02-18 MED ORDER — ATORVASTATIN CALCIUM 20 MG PO TABS
40.0000 mg | ORAL_TABLET | Freq: Every day | ORAL | Status: DC
  Filled 2018-02-18: qty 2

## 2018-02-18 MED ORDER — SODIUM CHLORIDE 0.9 % WEIGHT BASED INFUSION: 1.0000 mL/kg/h | INTRAVENOUS | Status: DC

## 2018-02-18 MED ORDER — FENTANYL CITRATE (PF) 100 MCG/2ML IJ SOLN
INTRAMUSCULAR | Status: DC | PRN
  Administered 2018-02-18: 25 ug via INTRAVENOUS

## 2018-02-18 MED ORDER — ACETAMINOPHEN 325 MG PO TABS: 650.0000 mg | ORAL_TABLET | ORAL | Status: DC | PRN

## 2018-02-18 MED ORDER — CLOPIDOGREL BISULFATE 75 MG PO TABS: 75.0000 mg | ORAL_TABLET | Freq: Every day | ORAL | Status: DC

## 2018-02-18 MED ORDER — SODIUM CHLORIDE 0.9 % IV SOLN: 250.0000 mL | INTRAVENOUS | Status: DC | PRN

## 2018-02-18 MED ORDER — MIDAZOLAM HCL 2 MG/2ML IJ SOLN
INTRAMUSCULAR | Status: DC | PRN
  Administered 2018-02-18: 1 mg via INTRAVENOUS

## 2018-02-18 MED ORDER — SODIUM CHLORIDE 0.9% FLUSH: 3.0000 mL | Freq: Two times a day (BID) | INTRAVENOUS | Status: DC

## 2018-02-18 MED ORDER — ASPIRIN 81 MG PO CHEW: 81.0000 mg | CHEWABLE_TABLET | Freq: Every day | ORAL | Status: DC

## 2018-02-18 MED ORDER — HEPARIN (PORCINE) IN NACL 1000-0.9 UT/500ML-% IV SOLN
INTRAVENOUS | Status: AC
  Filled 2018-02-18: qty 1000

## 2018-02-18 MED ORDER — IOPAMIDOL (ISOVUE-300) INJECTION 61%
INTRAVENOUS | Status: DC | PRN
  Administered 2018-02-18: 100 mL via INTRA_ARTERIAL

## 2018-02-18 MED ORDER — MIDAZOLAM HCL 2 MG/2ML IJ SOLN
INTRAMUSCULAR | Status: AC
  Filled 2018-02-18: qty 2

## 2018-02-18 MED ORDER — FENTANYL CITRATE (PF) 100 MCG/2ML IJ SOLN
INTRAMUSCULAR | Status: AC
  Filled 2018-02-18: qty 2

## 2018-02-18 SURGICAL SUPPLY — 9 items
CATH INFINITI 5FR ANG PIGTAIL (CATHETERS) ×3 IMPLANT
CATH INFINITI 5FR JL4 (CATHETERS) ×3 IMPLANT
CATH INFINITI JR4 5F (CATHETERS) ×3 IMPLANT
DEVICE CLOSURE MYNXGRIP 5F (Vascular Products) ×3 IMPLANT
KIT MANI 3VAL PERCEP (MISCELLANEOUS) ×3 IMPLANT
NEEDLE PERC 18GX7CM (NEEDLE) ×3 IMPLANT
PACK CARDIAC CATH (CUSTOM PROCEDURE TRAY) ×3 IMPLANT
SHEATH AVANTI 5FR X 11CM (SHEATH) ×3 IMPLANT
WIRE GUIDERIGHT .035X150 (WIRE) ×3 IMPLANT

## 2018-02-18 NOTE — Progress Notes (Signed)
Patient alert and oriented, vss, no complaints of pain.  Patient has had lunch and has ambulated with no shortness of breath or chest pain.

## 2018-02-18 NOTE — Progress Notes (Signed)
Patient Name: Sylvia Wiggins Date of Encounter: 02/18/2018  Hospital Problem List     Principal Problem:   Chest pain, rule out acute myocardial infarction Active Problems:   CAD (coronary artery disease)   NSTEMI (non-ST elevated myocardial infarction) (HCC)   HTN (hypertension)   Abnormal EKG   Chest pain    Patient Profile     57 year old female who presented with chest pain with mild troponin elevation.  Has a history of coronary disease status post PCI in 2017.  At that time she had a 90% stenosis in the first marginal, 80% distal LAD, 30% proximal RCA 40% mid RCA with a 90% RPDA, 100% second marginal, drug-eluting stent placed in the OM 2.  There is a patent stent in the LAD.  Subjective   No further chest pain.  Inpatient Medications    . [MAR Hold] aspirin EC  81 mg Oral Daily  . [MAR Hold] clopidogrel  75 mg Oral Q breakfast  . [MAR Hold] docusate sodium  100 mg Oral BID  . [MAR Hold] enoxaparin (LOVENOX) injection  1 mg/kg Subcutaneous Q12H  . [MAR Hold] gabapentin  300 mg Oral TID  . [MAR Hold] insulin aspart  0-9 Units Subcutaneous TID WC  . [MAR Hold] metoprolol tartrate  12.5 mg Oral BID  . [MAR Hold] nitroGLYCERIN  2 inch Topical Q6H  . [MAR Hold] simvastatin  40 mg Oral q1800  . sodium chloride flush  3 mL Intravenous Q12H  . [MAR Hold] venlafaxine XR  75 mg Oral Q breakfast    Vital Signs    Vitals:   02/17/18 1927 02/18/18 0540 02/18/18 0800 02/18/18 0811  BP: 131/79 119/76 122/78   Pulse: 62 60 66 (!) 54  Resp: Temp: 97.9 F (36.6 C) 98.4 F (36.9 C) 98.4 F (36.9 C)   TempSrc: Oral Oral Oral   SpO2: 95% 94% 95%   Weight:  68.1 kg (150 lb 1.6 oz)    Height:        Intake/Output Summary (Last 24 hours) at 02/18/2018 1229 Last data filed at 02/18/2018 1032 Gross per 24 hour  Intake 2935 ml  Output 400 ml  Net 2535 ml   Filed Weights   02/16/18 2204 02/17/18 0456 02/18/18 0540  Weight: 69 kg (152 lb 1.6 oz) 69.1 kg (152 lb  4.8 oz) 68.1 kg (150 lb 1.6 oz)    Physical Exam    GEN: Well nourished, well developed, in no acute distress.  HEENT: normal.  Neck: Supple, no JVD, carotid bruits, or masses. Cardiac: RRR, no murmurs, rubs, or gallops. No clubbing, cyanosis, edema.  Radials/DP/PT 2+ and equal bilaterally.  Respiratory:  Respirations regular and unlabored, clear to auscultation bilaterally. GI: Soft, nontender, nondistended, BS + x 4. MS: no deformity or atrophy. Skin: warm and dry, no rash. Neuro:  Strength and sensation are intact. Psych: Normal affect.  Labs    CBC Recent Labs    02/16/18 1812 02/17/18 0625  WBC 8.1 6.3  HGB 17.3* 15.1  HCT 50.5* 43.6  MCV 99.1 99.9  PLT 231 172   Basic Metabolic Panel Recent Labs    40/10/27 1812 02/17/18 0625  NA 138 139  K 3.8 4.0  CL 101 107  CO2 29 27  GLUCOSE 163* 125*  BUN 11 9  CREATININE 0.79 0.68  CALCIUM 10.0 8.6*   Liver Function Tests Recent Labs    02/16/18 1812 02/17/18 0625  AST 33 40  ALT 40 32  ALKPHOS 83 68  BILITOT 0.7 0.6  PROT 8.2* 6.2*  ALBUMIN 4.6 3.5   No results for input(s): LIPASE, AMYLASE in the last 72 hours. Cardiac Enzymes Recent Labs    02/17/18 0016 02/17/18 0625 02/17/18 1324  TROPONINI 0.14* 0.44* 0.83*   BNP No results for input(s): BNP in the last 72 hours. D-Dimer No results for input(s): DDIMER in the last 72 hours. Hemoglobin A1C No results for input(s): HGBA1C in the last 72 hours. Fasting Lipid Panel No results for input(s): CHOL, HDL, LDLCALC, TRIG, CHOLHDL, LDLDIRECT in the last 72 hours. Thyroid Function Tests No results for input(s): TSH, T4TOTAL, T3FREE, THYROIDAB in the last 72 hours.  Invalid input(s): FREET3  Telemetry    Sinus rhythm  ECG    Normal sinus rhythm  Radiology    Dg Chest 2 View  Result Date: 02/16/2018 CLINICAL DATA:  Acute shortness of breath and back pain. EXAM: CHEST - 2 VIEW COMPARISON:  08/29/2016 and prior chest radiographs FINDINGS: The  cardiomediastinal silhouette is unremarkable. A coronary stent is noted. There is no evidence of focal airspace disease, pulmonary edema, suspicious pulmonary nodule/mass, pleural effusion, or pneumothorax. No acute bony abnormalities are identified. IMPRESSION: No active cardiopulmonary disease. Electronically Signed   By: Harmon Pier M.D.   On: 02/16/2018 18:45   Ct Angio Chest Pe W And/or Wo Contrast  Result Date: 02/16/2018 CLINICAL DATA:  Chest pain began around 1600 today while resting. EXAM: CT ANGIOGRAPHY CHEST WITH CONTRAST TECHNIQUE: Multidetector CT imaging of the chest was performed using the standard protocol during bolus administration of intravenous contrast. Multiplanar CT image reconstructions and MIPs were obtained to evaluate the vascular anatomy. CONTRAST:  75mL ISOVUE-370 IOPAMIDOL (ISOVUE-370) INJECTION 76% COMPARISON:  Chest x-ray 02/16/2018, chest CT 03/29/2013 FINDINGS: Cardiovascular: Satisfactory opacification of the pulmonary arteries to the segmental level. No evidence of pulmonary embolism. Normal heart size. No pericardial effusion. LEFT coronary artery stent. Mediastinum/Nodes: No enlarged mediastinal, hilar, or axillary lymph nodes. Thyroid gland, trachea, and esophagus demonstrate no significant findings. Lungs/Pleura: Lungs are clear. No pleural effusion or pneumothorax. Upper Abdomen: Benign LEFT adrenal nodule is 1.7 centimeters. Gallbladder is present. Musculoskeletal: No chest wall abnormality. No acute or significant osseous findings. Review of the MIP images confirms the above findings. IMPRESSION: 1. Technically adequate exam showing no acute pulmonary embolus. 2. LEFT coronary stent. 3. Benign LEFT adrenal nodule. Electronically Signed   By: Norva Pavlov M.D.   On: 02/16/2018 20:02    Assessment & Plan    Patient with history of three-vessel coronary disease with a patent stent in the LAD by cath in 11/16/2015 with placement of a drug-eluting stent next to a  previously placed stent in the OM 2.  Now presented with chest pain with mild troponin elevation.  We will proceed with left heart cath further recommendations after this is complete.  Signed, Darlin Priestly Allecia Bells MD 02/18/2018, 12:29 PM  Pager: [phone]

## 2018-02-18 NOTE — Discharge Summary (Signed)
[facility] [place] at [facility]   PATIENT NAME: Sylvia Wiggins    MR#:  [id number]  DATE OF BIRTH:  1960-10-28  DATE OF ADMISSION:  02/16/2018 ADMITTING PHYSICIAN: Marguarite Arbour, MD  DATE OF DISCHARGE: 02/18/2018   PRIMARY CARE PHYSICIAN: Patient, [name]    ADMISSION DIAGNOSIS:  Atypical chest pain [R07.89] Hypertension, unspecified type [I10]  DISCHARGE DIAGNOSIS:  Principal Problem:   NSTEMI Active Problems:   CAD (coronary artery disease)   NSTEMI (non-ST elevated myocardial infarction) (HCC)   HTN (hypertension)   Abnormal EKG   Chest pain   SECONDARY DIAGNOSIS:   Past Medical History:  Diagnosis Date  . Abscess, gluteal, left    Recurrent drainage, followed by [facility].  . Arthritis   . CAD (coronary artery disease)    Stents in Clogged artery at [facility] June 2014  . Depression   . Hyperlipidemia   . Hypertension   . Myocardial infarction [facility]) June 2014    HOSPITAL COURSE:   * Non-ST elevation MI   Patient has a history of CAD and stent.   Continue cardiac medications.   Lovenox subcutaneous injections for now and appreciated her cardiologist.   Continue aspirin, Plavix, statin, metoprolol. Cardiac cath was done- Showed a distal blockage in RCA, but had collaterals and small lesion, os no interventions done. Suggest medical management.  * Hypertension   Continue metoprolol.  * Hyperlipidemia   Continue simvastatin.    DISCHARGE CONDITIONS:   Stable.  CONSULTS OBTAINED:  Treatment Team:  Lamar Blinks, MD Dalia Heading, MD  DRUG ALLERGIES:   Allergies  Allergen Reactions  . Penicillins Nausea Only    DISCHARGE MEDICATIONS:   Allergies as of 02/18/2018      Reactions   Penicillins Nausea Only      Medication List    TAKE these medications   aspirin EC 81 MG tablet Take 81 mg by mouth daily.   cholecalciferol 1000 units tablet Commonly known as:  VITAMIN D Take 1,000 Units by mouth daily.    clopidogrel 75 MG tablet Commonly known as:  PLAVIX Take 1 tablet (75 mg total) by mouth daily with breakfast.   gabapentin 300 MG capsule Commonly known as:  NEURONTIN Take 300 mg by mouth 3 (three) times daily.   metoprolol tartrate 25 MG tablet Commonly known as:  LOPRESSOR Take 0.5 tablets (12.5 mg total) by mouth 2 (two) times daily.   nitroGLYCERIN 0.4 MG SL tablet Commonly known as:  NITROSTAT Place 1 tablet (0.4 mg total) under the tongue every 5 (five) minutes as needed for chest pain.   simvastatin 40 MG tablet Commonly known as:  ZOCOR Take 1 tablet (40 mg total) by mouth daily at 6 PM.   venlafaxine XR 75 MG 24 hr capsule Commonly known as:  EFFEXOR-XR Take 1 capsule (75 mg total) by mouth daily with breakfast.   vitamin B-12 1000 MCG tablet Commonly known as:  CYANOCOBALAMIN Take 1,000 mcg by mouth daily.        DISCHARGE INSTRUCTIONS:    Follow with cardiologist in 1 week.  If you experience worsening of your admission symptoms, develop shortness of breath, life threatening emergency, suicidal or homicidal thoughts you must seek medical attention immediately by calling 911 or calling your MD immediately  if symptoms less severe.  You Must read complete instructions/literature along with all the possible adverse reactions/side effects for all the Medicines you take and that have been prescribed to you.  Take any new Medicines after you have completely understood and accept all the possible adverse reactions/side effects.   Please note  You were cared for by a hospitalist during your hospital stay. If you have any questions about your discharge medications or the care you received while you were in the hospital after you are discharged, you can call the unit and asked to speak with the hospitalist on call if the hospitalist that took care of you is not available. Once you are discharged, your primary care physician will handle any further medical issues. Please  note that NO REFILLS for any discharge medications will be authorized once you are discharged, as it is imperative that you return to your primary care physician (or establish a relationship with a primary care physician if you do not have one) for your aftercare needs so that they can reassess your need for medications and monitor your lab values.    Today   CHIEF COMPLAINT:   Chief Complaint  Patient presents with  . Chest Pain    HISTORY OF PRESENT ILLNESS:  Sylvia Wiggins  is a 57 y.o. female with a known history of CAD s/p stent placement 2 years ago now with recurrent pain between her shoulder blades similar to her sx's 2 years ago. Pain is "crampy and sharp/stabbing". No CP, SON, or N/V. No sweats. Pain comes and goes and is not exertional. EKG in ER abnormal but troponin normal. She is now admitted. No fever   VITAL SIGNS:  Blood pressure 132/84, pulse (!) 56, temperature 98.4 F (36.9 C), temperature source Oral, resp. rate 17, height  (1.575 m), weight 68.1 kg (150 lb 1.6 oz), SpO2 99 %.  I/O:    Intake/Output Summary (Last 24 hours) at 02/18/2018 1641 Last data filed at 02/18/2018 1032 Gross per 24 hour  Intake 1370 ml  Output 400 ml  Net 970 ml    PHYSICAL EXAMINATION:  GENERAL:  57 y.o.-year-old patient lying in the bed with no acute distress.  EYES: Pupils equal, round, reactive to light and accommodation. No scleral icterus. Extraocular muscles intact.  HEENT: Head atraumatic, normocephalic. Oropharynx and nasopharynx clear.  NECK:  Supple, no jugular venous distention. No thyroid enlargement, no tenderness.  LUNGS: Normal breath sounds bilaterally, no wheezing, rales,rhonchi or crepitation. No use of accessory muscles of respiration.  CARDIOVASCULAR: S1, S2 normal. No murmurs, rubs, or gallops.  ABDOMEN: Soft, non-tender, non-distended. Bowel sounds present. No organomegaly or mass.  EXTREMITIES: No pedal edema, cyanosis, or clubbing.  NEUROLOGIC: Cranial  nerves II through XII are intact. Muscle strength 5/5 in all extremities. Sensation intact. Gait not checked.  PSYCHIATRIC: The patient is alert and oriented x 3.  SKIN: No obvious rash, lesion, or ulcer.   DATA REVIEW:   CBC Recent Labs  Lab 02/17/18 0625  WBC 6.3  HGB 15.1  HCT 43.6  PLT 172    Chemistries  Recent Labs  Lab 02/17/18 0625  NA 139  K 4.0  CL 107  CO2 27  GLUCOSE 125*  BUN 9  CREATININE 0.68  CALCIUM 8.6*  AST 40  ALT 32  ALKPHOS 68  BILITOT 0.6    Cardiac Enzymes Recent Labs  Lab 02/17/18 1324  TROPONINI 0.83*    Microbiology Results  No results found for this or any previous visit.  RADIOLOGY:  Dg Chest 2 View  Result Date: 02/16/2018 CLINICAL DATA:  Acute shortness of breath and back pain. EXAM: CHEST - 2 VIEW COMPARISON:  08/29/2016  and prior chest radiographs FINDINGS: The cardiomediastinal silhouette is unremarkable. A coronary stent is noted. There is no evidence of focal airspace disease, pulmonary edema, suspicious pulmonary nodule/mass, pleural effusion, or pneumothorax. No acute bony abnormalities are identified. IMPRESSION: No active cardiopulmonary disease. Electronically Signed   By: Harmon Pier M.D.   On: 02/16/2018 18:45   Ct Angio Chest Pe W And/or Wo Contrast  Result Date: 02/16/2018 CLINICAL DATA:  Chest pain began around 1600 today while resting. EXAM: CT ANGIOGRAPHY CHEST WITH CONTRAST TECHNIQUE: Multidetector CT imaging of the chest was performed using the standard protocol during bolus administration of intravenous contrast. Multiplanar CT image reconstructions and MIPs were obtained to evaluate the vascular anatomy. CONTRAST:  75mL ISOVUE-370 IOPAMIDOL (ISOVUE-370) INJECTION 76% COMPARISON:  Chest x-ray 02/16/2018, chest CT 03/29/2013 FINDINGS: Cardiovascular: Satisfactory opacification of the pulmonary arteries to the segmental level. No evidence of pulmonary embolism. Normal heart size. No pericardial effusion. LEFT coronary  artery stent. Mediastinum/Nodes: No enlarged mediastinal, hilar, or axillary lymph nodes. Thyroid gland, trachea, and esophagus demonstrate no significant findings. Lungs/Pleura: Lungs are clear. No pleural effusion or pneumothorax. Upper Abdomen: Benign LEFT adrenal nodule is 1.7 centimeters. Gallbladder is present. Musculoskeletal: No chest wall abnormality. No acute or significant osseous findings. Review of the MIP images confirms the above findings. IMPRESSION: 1. Technically adequate exam showing no acute pulmonary embolus. 2. LEFT coronary stent. 3. Benign LEFT adrenal nodule. Electronically Signed   By: Norva Pavlov M.D.   On: 02/16/2018 20:02    EKG:   Orders placed or performed during the hospital encounter of 02/16/18  . ED EKG within 10 minutes  . ED EKG within 10 minutes  . EKG 12-Lead  . EKG 12-Lead  . EKG 12-Lead  . EKG 12-Lead      Management plans discussed with the patient, family and they are in agreement.  CODE STATUS: Full.    Code Status Orders  (From admission, onward)        Start     Ordered   02/18/18 1351  Full code  Continuous     02/18/18 1350    Code Status History    Date Active Date Inactive Code Status Order ID Comments User Context   02/16/2018 2205 02/18/2018 1350 Full Code [id number]  Marguarite Arbour, MD Inpatient   08/30/2016 0115 08/31/2016 1517 Full Code [id number]  Hugelmeyer, Jon Gills, DO Inpatient      TOTAL TIME TAKING CARE OF THIS PATIENT: 35 minutes.    Altamese Dilling M.D on 02/18/2018 at 4:41 PM  Between 7am to 6pm - Pager - [phone]  After 6pm go to [email or web address] - password [email or web address]  [facility]  Office  [phone]  CC: Primary care physician; Patient, [name]   Note: This dictation was prepared with Dragon dictation along with smaller phrase technology. Any transcriptional errors that result from this process are unintentional.

## 2018-02-18 NOTE — Progress Notes (Signed)
Patient dressed, ready for discharge. ABC intact. No needs. Discussed medications. Verbalized instructions back. Ambulates w/o difficulty. W/C to visitor entrance where ride was waiting. No further needs.

## 2018-02-18 NOTE — Progress Notes (Signed)
Cardiac cath completed today with no immediate complications. Infarct vessel was distal small RPL with collaterals from left. Patent stents in lad and lcx. Medical managaement. Conitnue asa, plavix. . Will need to treat with simvastatin.

## 2019-01-28 ENCOUNTER — Other Ambulatory Visit: Payer: Self-pay

## 2019-01-28 ENCOUNTER — Emergency Department: Payer: Medicare HMO

## 2019-01-28 ENCOUNTER — Emergency Department
Admission: EM | Admit: 2019-01-28 | Discharge: 2019-01-28 | Disposition: A | Discharge status: Home / Self Care | Payer: Medicare HMO | Attending: Emergency Medicine | Admitting: Emergency Medicine

## 2019-01-28 DIAGNOSIS — Y999 Unspecified external cause status: Secondary | ICD-10-CM | POA: Diagnosis not present

## 2019-01-28 DIAGNOSIS — Z7902 Long term (current) use of antithrombotics/antiplatelets: Secondary | ICD-10-CM | POA: Insufficient documentation

## 2019-01-28 DIAGNOSIS — F17210 Nicotine dependence, cigarettes, uncomplicated: Secondary | ICD-10-CM | POA: Diagnosis not present

## 2019-01-28 DIAGNOSIS — R51 Headache: Secondary | ICD-10-CM | POA: Insufficient documentation

## 2019-01-28 DIAGNOSIS — Z79899 Other long term (current) drug therapy: Secondary | ICD-10-CM | POA: Insufficient documentation

## 2019-01-28 DIAGNOSIS — W010XXA Fall on same level from slipping, tripping and stumbling without subsequent striking against object, initial encounter: Secondary | ICD-10-CM | POA: Insufficient documentation

## 2019-01-28 DIAGNOSIS — Z7982 Long term (current) use of aspirin: Secondary | ICD-10-CM | POA: Insufficient documentation

## 2019-01-28 DIAGNOSIS — I10 Essential (primary) hypertension: Secondary | ICD-10-CM | POA: Insufficient documentation

## 2019-01-28 DIAGNOSIS — M25512 Pain in left shoulder: Secondary | ICD-10-CM | POA: Insufficient documentation

## 2019-01-28 DIAGNOSIS — I252 Old myocardial infarction: Secondary | ICD-10-CM | POA: Insufficient documentation

## 2019-01-28 DIAGNOSIS — I2510 Atherosclerotic heart disease of native coronary artery without angina pectoris: Secondary | ICD-10-CM | POA: Diagnosis not present

## 2019-01-28 DIAGNOSIS — S79912A Unspecified injury of left hip, initial encounter: Secondary | ICD-10-CM | POA: Diagnosis present

## 2019-01-28 DIAGNOSIS — Y939 Activity, unspecified: Secondary | ICD-10-CM | POA: Insufficient documentation

## 2019-01-28 DIAGNOSIS — Y92002 Bathroom of unspecified non-institutional (private) residence single-family (private) house as the place of occurrence of the external cause: Secondary | ICD-10-CM | POA: Diagnosis not present

## 2019-01-28 DIAGNOSIS — S7002XA Contusion of left hip, initial encounter: Secondary | ICD-10-CM | POA: Insufficient documentation

## 2019-01-28 DIAGNOSIS — W19XXXA Unspecified fall, initial encounter: Secondary | ICD-10-CM

## 2019-01-28 DIAGNOSIS — T07XXXA Unspecified multiple injuries, initial encounter: Secondary | ICD-10-CM

## 2019-01-28 MED ORDER — TRAMADOL HCL 50 MG PO TABS: 50.0000 mg | ORAL_TABLET | Freq: Four times a day (QID) | ORAL | 0 refills | Status: DC | PRN

## 2019-01-28 MED ORDER — TRAMADOL HCL 50 MG PO TABS
50.0000 mg | ORAL_TABLET | Freq: Once | ORAL | Status: AC
  Administered 2019-01-28: 16:00:00 50 mg via ORAL
  Filled 2019-01-28: qty 1

## 2019-01-28 NOTE — ED Triage Notes (Signed)
Pt reports left shoulder pain after falling between toilet and night stand this morning. Reports she did hit her head but did not lose consciousness.

## 2019-01-28 NOTE — ED Provider Notes (Signed)
[facility] Emergency Department Provider Note  ____________________________________________   First MD Initiated Contact with Patient 01/28/19 1444     (approximate)  I have reviewed the triage vital signs and the nursing notes.   HISTORY  Chief Complaint Shoulder Pain    HPI Sylvia Wiggins is a 58 y.o. female presents emergency department complaining of left clavicle pain, headache, and left hip pain after falling between the toilet and the nightstand this morning.  She did hit her head but did not lose consciousness.  She states that bad headaches since then.   She denies any chest pain or shortness of breath.  She denies abdominal pain.   Past Medical History:  Diagnosis Date  . Abscess, gluteal, left    Recurrent drainage, followed by [facility].  . Arthritis   . CAD (coronary artery disease)    Stents in Clogged artery at [facility] June 2014  . Depression   . Hyperlipidemia   . Hypertension   . Myocardial infarction [facility]) June 2014    Patient Active Problem List   Diagnosis Date Noted  . HTN (hypertension) 02/16/2018  . Abnormal EKG 02/16/2018  . Chest pain 02/16/2018  . Chest pain, rule out acute myocardial infarction 08/30/2016  . NSTEMI (non-ST elevated myocardial infarction) (HCC) 08/30/2016  . CAD (coronary artery disease) 01/26/2016  . Hypertension 01/26/2016  . Depression 01/26/2016  . Hyperlipidemia 01/26/2016  . Tremor of both hands 01/26/2016  . Chronic right-sided low back pain with right-sided sciatica 01/26/2016  . Major depression 10/05/2015    Past Surgical History:  Procedure Laterality Date  . CARDIAC CATHETERIZATION N/A 08/30/2016   Procedure: Left Heart Cath and Coronary Angiography;  Surgeon: Marcina Millard, MD;  Location: [facility] INVASIVE CV LAB;  Service: Cardiovascular;  Laterality: N/A;  . CARDIAC SURGERY  03/30/2013   stent placement  . LEFT HEART CATH AND CORS/GRAFTS ANGIOGRAPHY N/A 02/18/2018   Procedure: LEFT  HEART CATH AND CORS/GRAFTS ANGIOGRAPHY;  Surgeon: Dalia Heading, MD;  Location: [facility] INVASIVE CV LAB;  Service: Cardiovascular;  Laterality: N/A;  . RECTAL SURGERY  Anal Fissure    Prior to Admission medications   Medication Sig Start Date End Date Taking? Authorizing Provider  aspirin EC 81 MG tablet Take 81 mg by mouth daily.     [provider]  cholecalciferol (VITAMIN D) 1000 units tablet Take 1,000 Units by mouth daily.    [provider]  clopidogrel (PLAVIX) 75 MG tablet Take 1 tablet (75 mg total) by mouth daily with breakfast. 09/01/16   Hower, Cletis Athens, MD  gabapentin (NEURONTIN) 300 MG capsule Take 300 mg by mouth 3 (three) times daily.     [provider]  metoprolol tartrate (LOPRESSOR) 25 MG tablet Take 0.5 tablets (12.5 mg total) by mouth 2 (two) times daily. 01/26/16   Ellyn Hack, MD  nitroGLYCERIN (NITROSTAT) 0.4 MG SL tablet Place 1 tablet (0.4 mg total) under the tongue every 5 (five) minutes as needed for chest pain. 08/31/16   Hower, Cletis Athens, MD  simvastatin (ZOCOR) 40 MG tablet Take 1 tablet (40 mg total) by mouth daily at 6 PM. 01/26/16   Ellyn Hack, MD  traMADol (ULTRAM) 50 MG tablet Take 1 tablet (50 mg total) by mouth every 6 (six) hours as needed. 01/28/19   Faythe Ghee, PA-C  venlafaxine XR (EFFEXOR-XR) 75 MG 24 hr capsule Take 1 capsule (75 mg total) by mouth daily with breakfast. 01/26/16   Sherryll Burger,  Melanee Spry, MD  vitamin B-12 (CYANOCOBALAMIN) 1000 MCG tablet Take 1,000 mcg by mouth daily.    [provider]    Allergies Penicillins  Family History  Problem Relation Age of Onset  . Cancer Mother        colon  . Heart disease Father        Died of Heart Attack    Social History Social History   Tobacco Use  . Smoking status: Current Every Day Smoker    Packs/day: 0.25    Types: Cigarettes  . Smokeless tobacco: Never Used  Substance Use Topics  . Alcohol use: No    Alcohol/week: 0.0 standard drinks   . Drug use: No    Review of Systems  Constitutional: No fever/chills, positive headache Eyes: No visual changes. ENT: No sore throat. Respiratory: Denies cough Genitourinary: Negative for dysuria. Musculoskeletal: Negative for back pain.  Positive for neck pain and left clavicle pain along with left hip pain Skin: Negative for rash.    ____________________________________________   PHYSICAL EXAM:  VITAL SIGNS: ED Triage Vitals  Enc Vitals Group     BP --      Pulse --      Resp --      Temp --      Temp src --      SpO2 --      Weight 01/28/19 1444 150 lb (68 kg)     Height 01/28/19 1444  (1.575 m)     Head Circumference --      Peak Flow --      Pain Score 01/28/19 1443 10     Pain Loc --      Pain Edu? --      Excl. in GC? --     Constitutional: Alert and oriented. Well appearing and in no acute distress. Eyes: Conjunctivae are normal.  Head: Atraumatic. Nose: No congestion/rhinnorhea. Mouth/Throat: Mucous membranes are moist.   Neck:  supple no lymphadenopathy noted Cardiovascular: Normal rate, regular rhythm.  Respiratory: Normal respiratory effort.  No retractions GU: deferred Musculoskeletal: FROM all extremities, warm and well perfused, left clavicle is tender, left hip is tender, large bruises noted along the left side of the hip, C-spine is minimally tender, Neurologic:  Normal speech and language.  Skin:  Skin is warm, dry and intact. No rash noted. Psychiatric: Mood and affect are normal. Speech and behavior are normal.  ____________________________________________   LABS (all labs ordered are listed, but only abnormal results are displayed)  Labs Reviewed - No data to display ____________________________________________   ____________________________________________  RADIOLOGY  CT of the head and C-spine is negative X-ray left clavicle is negative X-ray of left hip is negative  ____________________________________________    PROCEDURES  Procedure(s) performed: No  Procedures    ____________________________________________   INITIAL IMPRESSION / ASSESSMENT AND PLAN / ED COURSE  Pertinent labs & imaging results that were available during my care of the patient were reviewed by me and considered in my medical decision making (see chart for details).   Patient is a 58 year old female presents emergency department after a fall at home earlier this morning.  She fell between the toilet and the sink.  She is complaining of headache, neck pain, left shoulder pain and left hip pain.  She denies any chest pain or shortness of breath.  She denies abdominal pain  Physical exam shows that the patient is able to stand and walk without difficulty.  C-spine is mildly tender.  Left shoulder is tender.  Left hip has a large bruise and is tender.  CT of the head and C-spine are both negative for any acute abnormalities X-ray of the left hip and left shoulder are both negative for any fractures  Explained all the results to the patient.  She was given head injury instructions with strict instructions to return if she is worsening.  She was given a prescription for tramadol.  She is to also take Tylenol if needed.  She states she understands and will comply.  Patient was discharged stable condition.     As part of my medical decision making, I reviewed the following data within the electronic MEDICAL RECORD NUMBER Nursing notes reviewed and incorporated, Old chart reviewed, Radiograph reviewed CT of the head and C-spine are negative, x-ray of left shoulder and left hip are negative, Notes from prior ED visits and [place] Controlled Substance Database  ____________________________________________   FINAL CLINICAL IMPRESSION(S) / ED DIAGNOSES  Final diagnoses:  Fall, initial encounter  Multiple contusions      NEW MEDICATIONS STARTED DURING THIS VISIT:  New Prescriptions   TRAMADOL (ULTRAM) 50 MG TABLET    Take 1 tablet (50  mg total) by mouth every 6 (six) hours as needed.     Note:  This document was prepared using Dragon voice recognition software and may include unintentional dictation errors.    Faythe GheeFisher, Latasha Buczkowski W, PA-C 01/28/19 1554    Sharman CheekStafford, Phillip, MD 01/29/19 [phone]

## 2019-01-28 NOTE — Discharge Instructions (Addendum)
Follow-up with your regular doctor or the acute care if not better in 5 days.  Return emergency department worsening.  Take all of your medications as prescribed.  You may also take Tylenol for pain as needed.

## 2019-01-28 NOTE — ED Notes (Signed)
Pt reports she is feeling ok but is still having left shoulder pain

## 2019-01-28 NOTE — ED Notes (Signed)
PA at bedside.

## 2019-01-28 NOTE — ED Notes (Signed)
Pt c/o headache and requests something for pain, notified Annice Pih PA, tramadol ordered

## 2019-04-10 ENCOUNTER — Other Ambulatory Visit: Payer: Self-pay | Admitting: Surgery

## 2019-04-10 ENCOUNTER — Other Ambulatory Visit (HOSPITAL_COMMUNITY): Payer: Self-pay | Admitting: Surgery

## 2019-04-10 DIAGNOSIS — K4090 Unilateral inguinal hernia, without obstruction or gangrene, not specified as recurrent: Secondary | ICD-10-CM

## 2019-04-17 ENCOUNTER — Ambulatory Visit
Admission: RE | Admit: 2019-04-17 | Discharge: 2019-04-17 | Disposition: A | Discharge status: Home / Self Care | Payer: Medicare HMO | Source: Ambulatory Visit | Attending: Surgery | Admitting: Surgery

## 2019-04-17 ENCOUNTER — Other Ambulatory Visit: Payer: Self-pay

## 2019-04-17 DIAGNOSIS — K4090 Unilateral inguinal hernia, without obstruction or gangrene, not specified as recurrent: Secondary | ICD-10-CM | POA: Insufficient documentation

## 2019-04-17 MED ORDER — IOHEXOL 300 MG/ML  SOLN
100.0000 mL | Freq: Once | INTRAMUSCULAR | Status: AC | PRN
  Administered 2019-04-17: 100 mL via INTRAVENOUS

## 2019-04-24 ENCOUNTER — Ambulatory Visit: Payer: Self-pay | Admitting: Surgery

## 2019-04-24 NOTE — H&P (Signed)
Subjective:   CC: Inguinal hernia without obstruction or gangrene, recurrence not specified, unspecified laterality [K40.90]  HPI:  Sylvia Wiggins is a 58 y.o. female who was referred by Lurlean Leyden, * for evaluation of above. Symptoms were first noted several years ago. Pain is intermittent and discomfort, confined to the right inguinal region, without radiation.  Associated with lump, exacerbated by nothing specific  Lump is reducible. Patient has no symptoms of  difficulty urinating.    Past Medical History:  has a past medical history of Acute myocardial infarction of anterolateral wall, initial episode of care (CMS-HCC), Anal fistula, Bell's palsy, CAD (coronary artery disease), Chronic back pain, Hemorrhoids, Hyperlipidemia, Hypertension, Kidney stone (2000), Kidney stone on left side (1997), Myocardial infarction (CMS-HCC) (03/2013), Osteoarthritis, Tethered spinal cord (CMS-HCC) (03/23/2017), and Tremor.  Past Surgical History:       Past Surgical History:  Procedure Laterality Date  . ANORECTAL EXAM N/A 11/16/2014   Procedure: ANORECTAL EXAM;  Surgeon: Gwinda Maine, MD;  [facility];  Service: General Surgery;  Laterality: N/A;  . ANORECTAL EXAM N/A 05/31/2015   Procedure: ANORECTAL EXAM;  Surgeon: Gwinda Maine, MD;  [facility]: [facility];  Service: General Surgery;  Laterality: N/A;  . ANORECTAL EXAM N/A 11/22/2015   Procedure: ANORECTAL EXAM, REQUIRING ANESTHESIA , DIAGNOSTIC;  Surgeon: Gwinda Maine, MD;  Location: [facility];  Service: General Surgery;  Laterality: N/A;  . CARDIAC SURGERY  2014   cardiac cath with 1 stent placement  . CESAREAN SECTION  1998  . CORONARY ANGIOPLASTY WITH STENT PLACEMENT    . EXTRACTION OF WISDOM TOOTH  1980s  . FISTULOTOMY ANAL N/A 11/16/2014   Procedure: FISTULOTOMY ANAL;  Surgeon: Gwinda Maine, MD;  [facility];  Service: General Surgery;  Laterality: N/A;  . FISTULOTOMY ANAL N/A 05/31/2015   Procedure: FISTULOTOMY ANAL;  Surgeon:  Gwinda Maine, MD;  [facility];  Service: General Surgery;  Laterality: N/A;  . HEMORRHOIDECTOMY EXTERNAL  2006  . LAMINECTOMY LUMBAR W/RELEASE TETHERED CORD N/A 04/02/2017   Procedure: LAMINECTOMY, WITH RELEASE OF TETHERED SPINAL CORD, LUMBAR---L5 AND S1 LAM FOR TETHERED SPINAL CORD;  Surgeon: Ricard Dillon, MD;  Location: [facility] OPERATING ROOMS;  Service: Neurosurgery;  Laterality: N/A;  . LITHOTRIPSY  1997, 2000  . MICROSURGERY N/A 04/02/2017   Procedure: MICROSURGICAL TECHNIQUES, REQUIRING USE OF OPERATING MICROSCOPE (LIST IN ADDITION TO PRIMARY PROCEDURE);  Surgeon: Ricard Dillon, MD;  Location: [facility] OPERATING ROOMS;  Service: Neurosurgery;  Laterality: N/A;  . PCI/STENTING  2014   DES to LAD   . PLACEMENT SETON N/A 11/16/2014   Procedure: PLACEMENT SETON;  Surgeon: Gwinda Maine, MD;  [facility];  Service: General Surgery;  Laterality: N/A;  . PLACEMENT SETON N/A 05/31/2015   Procedure: PLACEMENT SETON;  Surgeon: Gwinda Maine, MD;  [facility]: [facility];  Service: General Surgery;  Laterality: N/A;    Family History: family history includes Alcohol abuse in her sister; Cancer in her mother; Colon cancer in her mother; Coronary Artery Disease (Blocked arteries around heart) in her father, maternal grandfather, mother, and sister; Myocardial Infarction (Heart attack) in her father and sister; Stroke in her sister.  Social History:  reports that she has been smoking. She has a 2.50 pack-year smoking history. She has never used smokeless tobacco. She reports that she does not drink alcohol or use drugs.  Current Medications: has a current medication list which includes the following prescription(s): aspirin, cholecalciferol, cyanocobalamin, gabapentin, metoprolol tartrate, and nitroglycerin.  Allergies:  Allergies as of 04/09/2019 - Reviewed 04/03/2019  Allergen Reaction Noted  . Penicillins Nausea 03/30/2013    ROS:  A 15 point review of systems  was performed and pertinent positives and negatives noted in HPI   Objective:   BP 120/76   Pulse 75   Ht 157.5 cm (5\' 2" )   Wt 65.8 kg (145 lb)   BMI 26.52 kg/m   Constitutional :  alert, appears stated age, cooperative and no distress  Lymphatics/Throat:  no asymmetry, masses, or scars  Respiratory:  clear to auscultation bilaterally  Cardiovascular:  regular rate and rhythm  Gastrointestinal: soft, non-tender; bowel sounds normal; no masses,  no organomegaly. Inguinal vs c-section incisional hernia noted.  large, reducible, no overlying skin changes and right groin extending over pubic tubercle  Musculoskeletal: Steady gait and movement  Skin: Cool and moist,   Psychiatric: Normal affect, non-agitated, not confused       LABS:  N/a   RADS: n/a Assessment:       Inguinal hernia without obstruction or gangrene, recurrence not specified, unspecified laterality [K40.90] Possible incisional but hard to tell due to extremely large size of hernia. Plan:   1. Inguinal hernia without obstruction or gangrene, recurrence not specified, unspecified laterality [K40.90]   Will proceed with CT a/p first to see whole extent of hernia defect for operative planning purposes.  Can consider lap vs open depending on CT results.  Will patient back with results once available    Electronically signed by Sung AmabileSakai, Wallis Spizzirri, DO on 04/10/2019 8:26 AM

## 2019-04-24 NOTE — H&P (View-Only) (Signed)
Subjective:   CC: Inguinal hernia without obstruction or gangrene, recurrence not specified, unspecified laterality [K40.90]  HPI:  Sylvia Wiggins is a 58 y.o. female who was referred by Lurlean Leyden, * for evaluation of above. Symptoms were first noted several years ago. Pain is intermittent and discomfort, confined to the right inguinal region, without radiation.  Associated with lump, exacerbated by nothing specific  Lump is reducible. Patient has no symptoms of  difficulty urinating.    Past Medical History:  has a past medical history of Acute myocardial infarction of anterolateral wall, initial episode of care (CMS-HCC), Anal fistula, Bell's palsy, CAD (coronary artery disease), Chronic back pain, Hemorrhoids, Hyperlipidemia, Hypertension, Kidney stone (2000), Kidney stone on left side (1997), Myocardial infarction (CMS-HCC) (03/2013), Osteoarthritis, Tethered spinal cord (CMS-HCC) (03/23/2017), and Tremor.  Past Surgical History:       Past Surgical History:  Procedure Laterality Date  . ANORECTAL EXAM N/A 11/16/2014   Procedure: ANORECTAL EXAM;  Surgeon: Gwinda Maine, MD;  [facility];  Service: General Surgery;  Laterality: N/A;  . ANORECTAL EXAM N/A 05/31/2015   Procedure: ANORECTAL EXAM;  Surgeon: Gwinda Maine, MD;  [facility];  Service: General Surgery;  Laterality: N/A;  . ANORECTAL EXAM N/A 11/22/2015   Procedure: ANORECTAL EXAM, REQUIRING ANESTHESIA , DIAGNOSTIC;  Surgeon: Gwinda Maine, MD;  [facility];  Service: General Surgery;  Laterality: N/A;  . CARDIAC SURGERY  2014   cardiac cath with 1 stent placement  . CESAREAN SECTION  1998  . CORONARY ANGIOPLASTY WITH STENT PLACEMENT    . EXTRACTION OF WISDOM TOOTH  1980s  . FISTULOTOMY ANAL N/A 11/16/2014   Procedure: FISTULOTOMY ANAL;  Surgeon: Gwinda Maine, MD;  [facility];  Service: General Surgery;  Laterality: N/A;  . FISTULOTOMY ANAL N/A 05/31/2015   Procedure: FISTULOTOMY ANAL;  Surgeon:  Gwinda Maine, MD;  [facility];  Service: General Surgery;  Laterality: N/A;  . HEMORRHOIDECTOMY EXTERNAL  2006  . LAMINECTOMY LUMBAR W/RELEASE TETHERED CORD N/A 04/02/2017   Procedure: LAMINECTOMY, WITH RELEASE OF TETHERED SPINAL CORD, LUMBAR---L5 AND S1 LAM FOR TETHERED SPINAL CORD;  Surgeon: Ricard Dillon, MD;  Location: [facility];  Service: Neurosurgery;  Laterality: N/A;  . LITHOTRIPSY  1997, 2000  . MICROSURGERY N/A 04/02/2017   Procedure: MICROSURGICAL TECHNIQUES, REQUIRING USE OF OPERATING MICROSCOPE (LIST IN ADDITION TO PRIMARY PROCEDURE);  Surgeon: Ricard Dillon, MD;  Location: [facility];  Service: Neurosurgery;  Laterality: N/A;  . PCI/STENTING  2014   DES to LAD   . PLACEMENT SETON N/A 11/16/2014   Procedure: PLACEMENT SETON;  Surgeon: Gwinda Maine, MD;  [facility];  Service: General Surgery;  Laterality: N/A;  . PLACEMENT SETON N/A 05/31/2015   Procedure: PLACEMENT SETON;  Surgeon: Gwinda Maine, MD;  [facility];  Service: General Surgery;  Laterality: N/A;    Family History: family history includes Alcohol abuse in her sister; Cancer in her mother; Colon cancer in her mother; Coronary Artery Disease (Blocked arteries around heart) in her father, maternal grandfather, mother, and sister; Myocardial Infarction (Heart attack) in her father and sister; Stroke in her sister.  Social History:  reports that she has been smoking. She has a 2.50 pack-year smoking history. She has never used smokeless tobacco. She reports that she does not drink alcohol or use drugs.  Current Medications: has a current medication list which includes the following prescription(s): aspirin, cholecalciferol, cyanocobalamin, gabapentin, metoprolol tartrate, and nitroglycerin.  Allergies:  Allergies as of 04/09/2019 - Reviewed 04/03/2019  Allergen Reaction Noted  . Penicillins Nausea 03/30/2013    ROS:  A 15 point review of systems  was performed and pertinent positives and negatives noted in HPI   Objective:   BP 120/76   Pulse 75   Ht 157.5 cm (5' 2")   Wt 65.8 kg (145 lb)   BMI 26.52 kg/m   Constitutional :  alert, appears stated age, cooperative and no distress  Lymphatics/Throat:  no asymmetry, masses, or scars  Respiratory:  clear to auscultation bilaterally  Cardiovascular:  regular rate and rhythm  Gastrointestinal: soft, non-tender; bowel sounds normal; no masses,  no organomegaly. Inguinal vs c-section incisional hernia noted.  large, reducible, no overlying skin changes and right groin extending over pubic tubercle  Musculoskeletal: Steady gait and movement  Skin: Cool and moist,   Psychiatric: Normal affect, non-agitated, not confused       LABS:  N/a   RADS: n/a Assessment:       Inguinal hernia without obstruction or gangrene, recurrence not specified, unspecified laterality [K40.90] Possible incisional but hard to tell due to extremely large size of hernia. Plan:   1. Inguinal hernia without obstruction or gangrene, recurrence not specified, unspecified laterality [K40.90]   Will proceed with CT a/p first to see whole extent of hernia defect for operative planning purposes.  Can consider lap vs open depending on CT results.  Will patient back with results once available    Electronically signed by Devon Kingdon, DO on 04/10/2019 8:26 AM     

## 2019-05-07 IMAGING — CR DG CHEST 2V
2 series · 2 of 2 positions shown · non-contrast
Comparison: 08/29/2016 and prior chest radiographs

CLINICAL DATA: Acute shortness of breath and back pain.

EXAM:
CHEST - 2 VIEW

[chest pa]
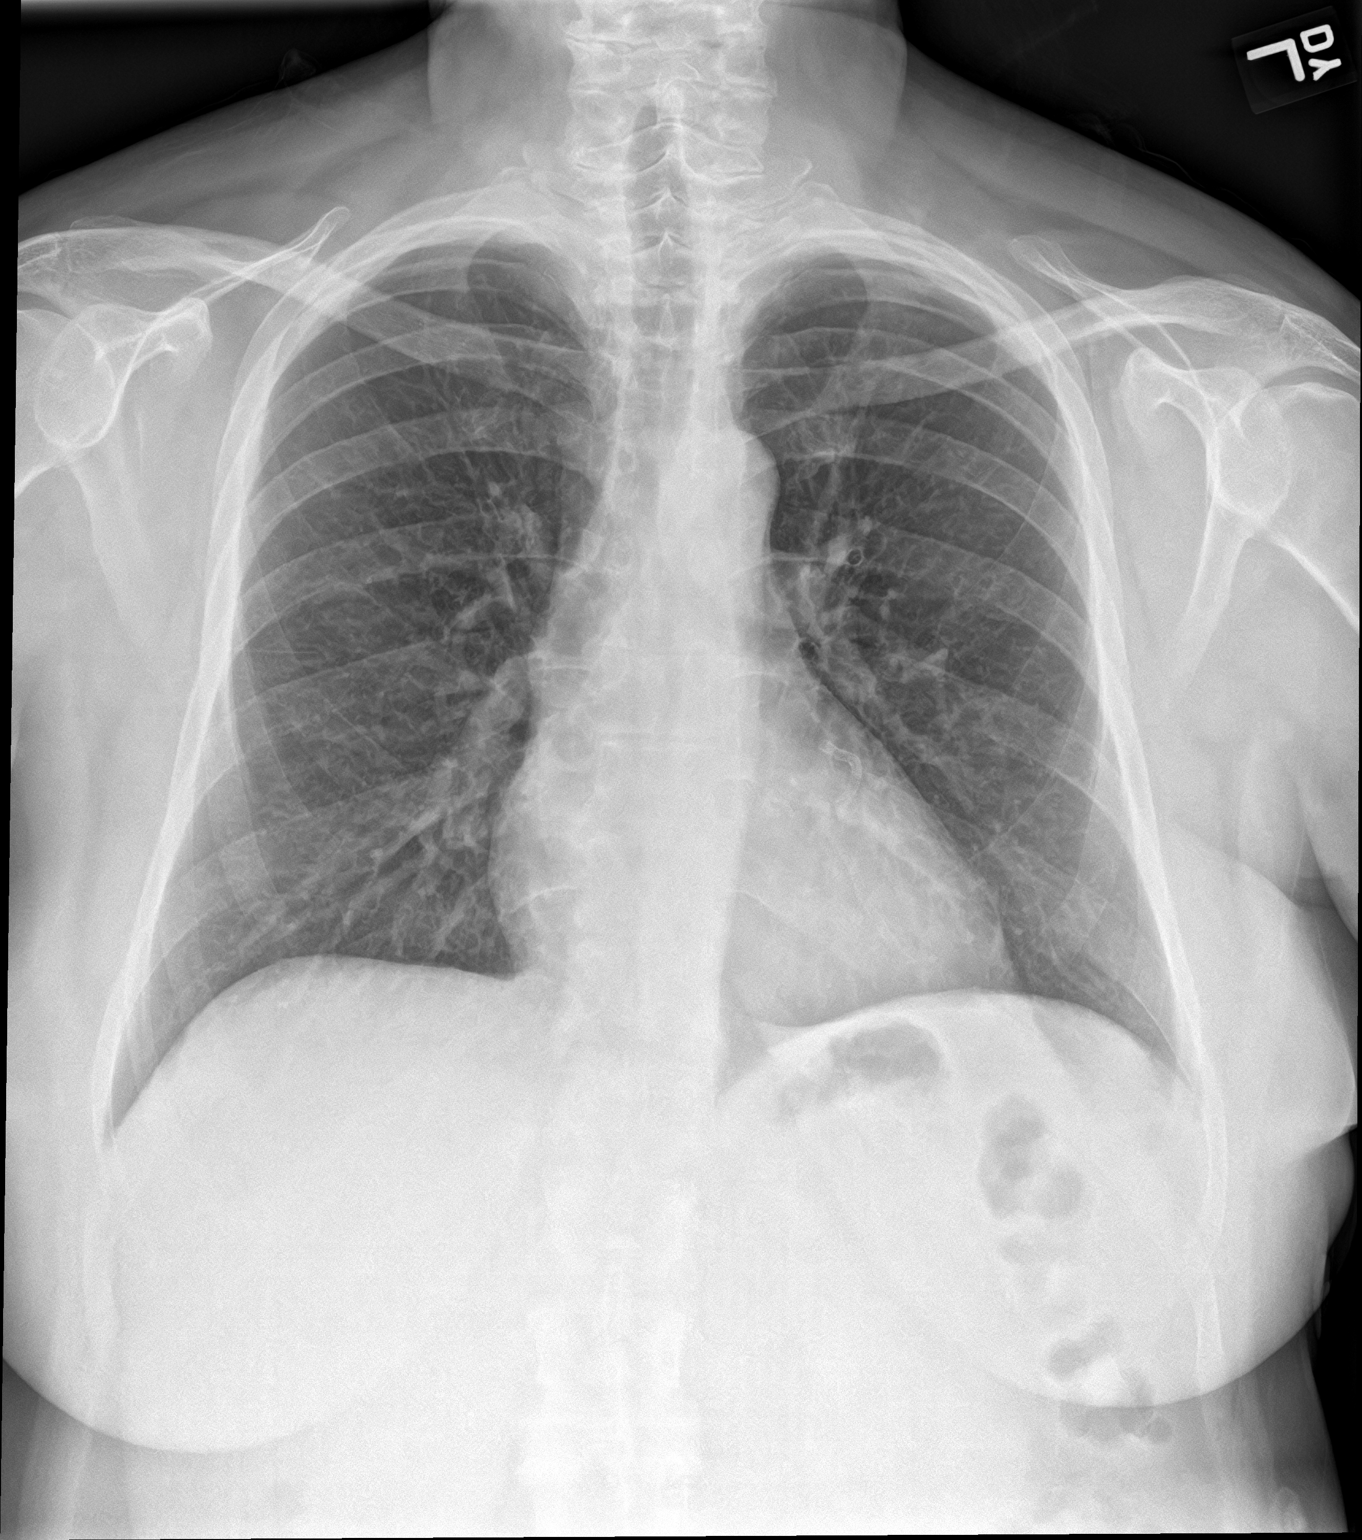

[chest lat]
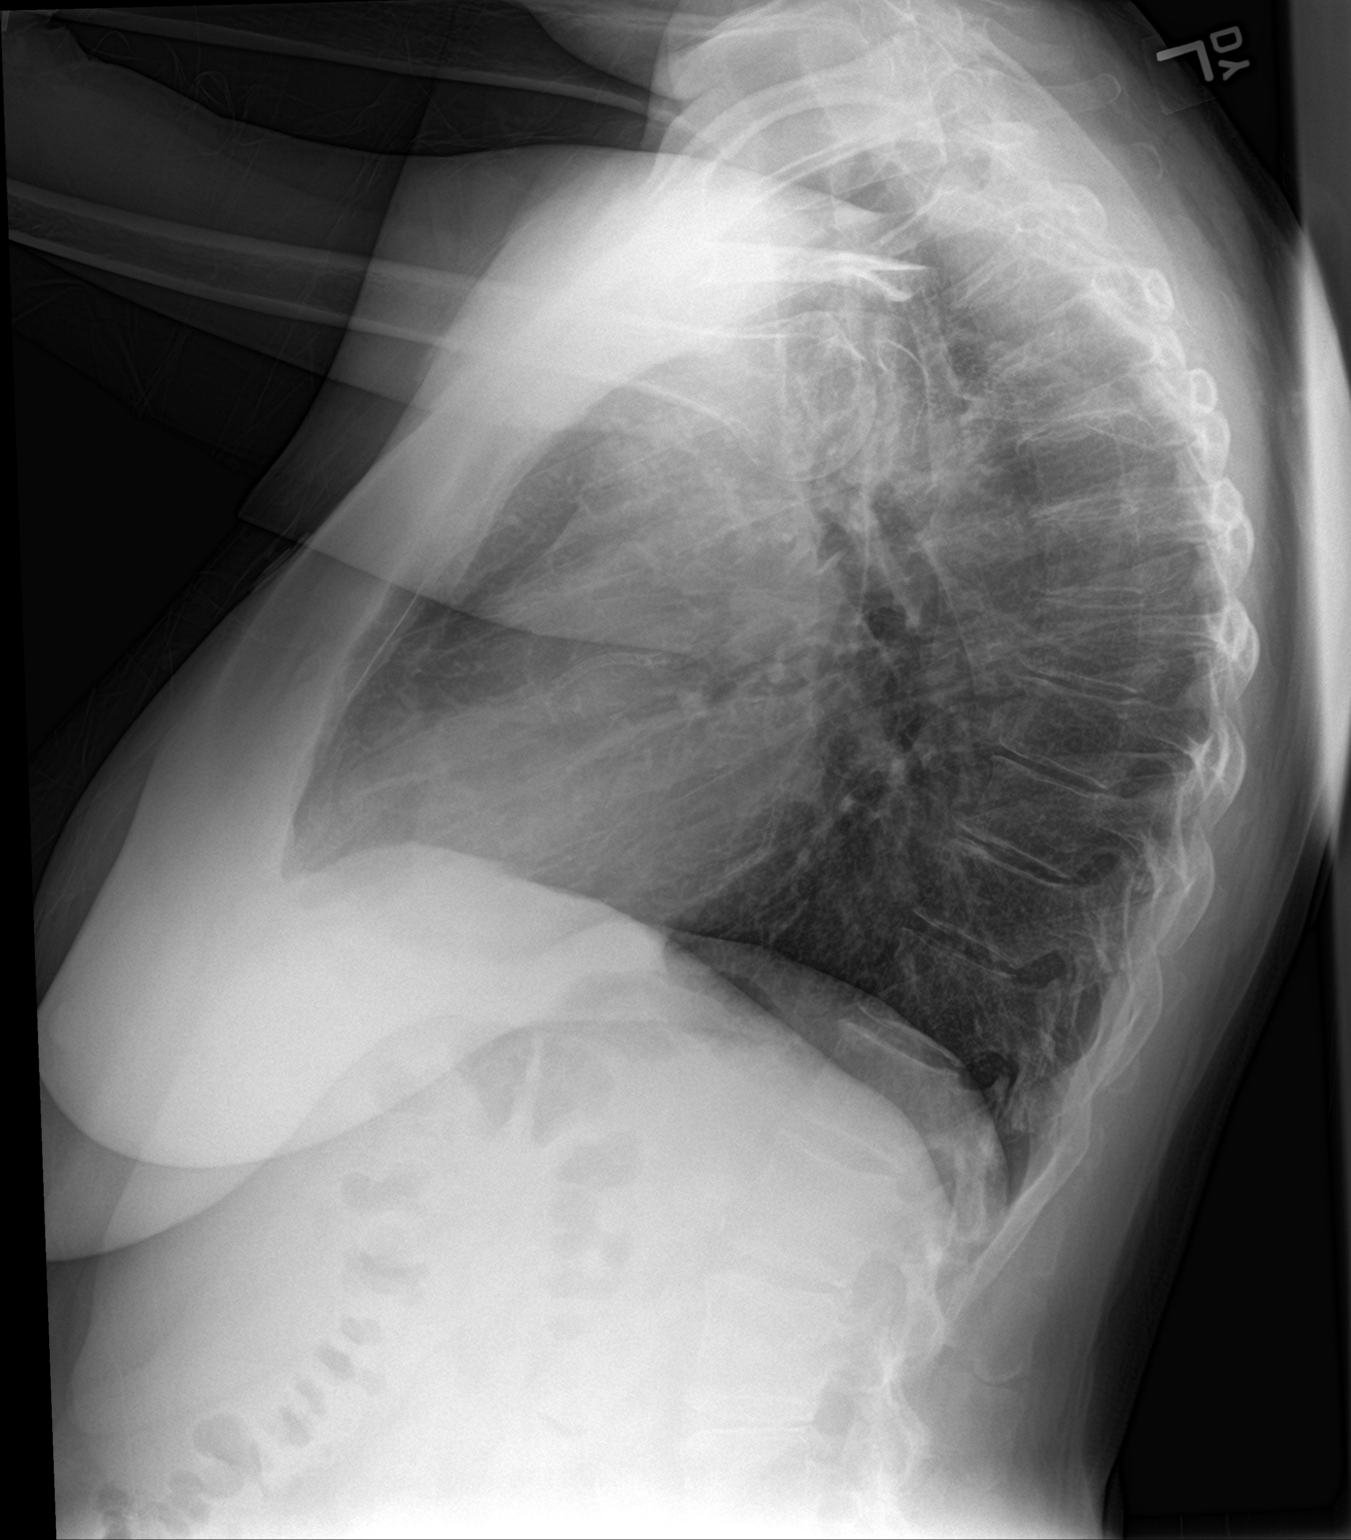

[2 of 2 positions shown; findings below may reference images not displayed]

FINDINGS: The cardiomediastinal silhouette is unremarkable. A coronary stent
is noted.

There is no evidence of focal airspace disease, pulmonary edema,
suspicious pulmonary nodule/mass, pleural effusion, or pneumothorax.

No acute bony abnormalities are identified.
IMPRESSION: No active cardiopulmonary disease.

## 2019-05-13 ENCOUNTER — Encounter
Admission: RE | Admit: 2019-05-13 | Discharge: 2019-05-13 | Disposition: A | Discharge status: Home / Self Care | Payer: Medicare HMO | Source: Ambulatory Visit | Attending: Surgery | Admitting: Surgery

## 2019-05-13 ENCOUNTER — Other Ambulatory Visit: Payer: Self-pay

## 2019-05-13 DIAGNOSIS — M545 Low back pain: Secondary | ICD-10-CM | POA: Diagnosis not present

## 2019-05-13 DIAGNOSIS — E785 Hyperlipidemia, unspecified: Secondary | ICD-10-CM | POA: Insufficient documentation

## 2019-05-13 DIAGNOSIS — I252 Old myocardial infarction: Secondary | ICD-10-CM | POA: Diagnosis not present

## 2019-05-13 DIAGNOSIS — K4020 Bilateral inguinal hernia, without obstruction or gangrene, not specified as recurrent: Secondary | ICD-10-CM | POA: Insufficient documentation

## 2019-05-13 DIAGNOSIS — I10 Essential (primary) hypertension: Secondary | ICD-10-CM | POA: Diagnosis not present

## 2019-05-13 DIAGNOSIS — F17210 Nicotine dependence, cigarettes, uncomplicated: Secondary | ICD-10-CM | POA: Diagnosis not present

## 2019-05-13 DIAGNOSIS — Z7982 Long term (current) use of aspirin: Secondary | ICD-10-CM | POA: Diagnosis not present

## 2019-05-13 DIAGNOSIS — Z79899 Other long term (current) drug therapy: Secondary | ICD-10-CM | POA: Diagnosis not present

## 2019-05-13 DIAGNOSIS — Z01810 Encounter for preprocedural cardiovascular examination: Secondary | ICD-10-CM

## 2019-05-13 DIAGNOSIS — I2510 Atherosclerotic heart disease of native coronary artery without angina pectoris: Secondary | ICD-10-CM | POA: Diagnosis not present

## 2019-05-13 DIAGNOSIS — Z01818 Encounter for other preprocedural examination: Secondary | ICD-10-CM | POA: Insufficient documentation

## 2019-05-13 DIAGNOSIS — Z20828 Contact with and (suspected) exposure to other viral communicable diseases: Secondary | ICD-10-CM | POA: Insufficient documentation

## 2019-05-13 DIAGNOSIS — Z955 Presence of coronary angioplasty implant and graft: Secondary | ICD-10-CM | POA: Diagnosis not present

## 2019-05-13 HISTORY — DX: Hyperlipidemia, unspecified: E78.5

## 2019-05-13 LAB — SARS CORONAVIRUS 2 (TAT 6-24 HRS): SARS Coronavirus 2: NEGATIVE

## 2019-05-13 NOTE — Patient Instructions (Signed)
Your procedure is scheduled on: 05/16/2019 Fri Report to Same Day Surgery 2nd floor medical mall [facility] Entrance-take elevator on left to 2nd floor.  Check in with surgery information desk.) To find out your arrival time please call [phone] between 1PM - 3PM on 05/15/2019 Thurs  Remember: Instructions that are not followed completely may result in serious medical risk, up to and including death, or upon the discretion of your surgeon and anesthesiologist your surgery may need to be rescheduled.    _x___ 1. Do not eat food after midnight the night before your procedure. You may drink clear liquids up to 2 hours before you are scheduled to arrive at the hospital for your procedure.  Do not drink clear liquids within 2 hours of your scheduled arrival to the hospital.  Clear liquids include  --Water or Apple juice without pulp  --Clear carbohydrate beverage such as ClearFast or Gatorade  --Black Coffee or Clear Tea (No milk, no creamers, do not add anything to                  the coffee or Tea Type 1 and type 2 diabetics should only drink water.   ____Ensure clear carbohydrate drink on the way to the hospital for bariatric patients  ____Ensure clear carbohydrate drink 3 hours before surgery.   No gum chewing or hard candies.     __x__ 2. No Alcohol for 24 hours before or after surgery.   __x__3. No Smoking or e-cigarettes for 24 prior to surgery.  Do not use any chewable tobacco products for at least 6 hour prior to surgery   ____  4. Bring all medications with you on the day of surgery if instructed.    __x__ 5. Notify your doctor if there is any change in your medical condition     (cold, fever, infections).    x___6. On the morning of surgery brush your teeth with toothpaste and water.  You may rinse your mouth with mouth wash if you wish.  Do not swallow any toothpaste or mouthwash.   Do not wear jewelry, make-up, hairpins, clips or nail polish.  Do not wear lotions,  powders, or perfumes. You may wear deodorant.  Do not shave 48 hours prior to surgery. Men may shave face and neck.  Do not bring valuables to the hospital.    [facility] is not responsible for any belongings or valuables.               Contacts, dentures or bridgework may not be worn into surgery.  Leave your suitcase in the car. After surgery it may be brought to your room.  For patients admitted to the hospital, discharge time is determined by your                       treatment team.  _  Patients discharged the day of surgery will not be allowed to drive home.  You will need someone to drive you home and stay with you the night of your procedure.    Please read over the following fact sheets that you were given:   [facility] Preparing for Surgery and or MRSA Information   _x___ Take anti-hypertensive listed below, cardiac, seizure, asthma,     anti-reflux and psychiatric medicines. These include:  1.   2.  3.  4.  5.  6.  ____Fleets enema or Magnesium Citrate as directed.   _x___ Use CHG Soap or  sage wipes as directed on instruction sheet   ____ Use inhalers on the day of surgery and bring to hospital day of surgery  ____ Stop Metformin and Janumet 2 days prior to surgery.    ____ Take 1/2 of usual insulin dose the night before surgery and none on the morning     surgery.   _x___ Follow recommendations from Cardiologist, Pulmonologist or PCP regarding          stopping Aspirin, Coumadin, Plavix ,Eliquis, Effient, or Pradaxa, and Pletal.  X____Stop Anti-inflammatories such as Advil, Aleve, Ibuprofen, Motrin, Naproxen, Naprosyn, Goodies powders or aspirin products. OK to take Tylenol and                          Celebrex.   _x___ Stop supplements until after surgery.  But may continue Vitamin D, Vitamin B,       and multivitamin.   ____ Bring C-Pap to the hospital.

## 2019-05-15 MED ORDER — CLINDAMYCIN PHOSPHATE 900 MG/50ML IV SOLN
900.0000 mg | INTRAVENOUS | Status: AC
  Administered 2019-05-16: 09:00:00 900 mg via INTRAVENOUS

## 2019-05-16 ENCOUNTER — Ambulatory Visit: Payer: Medicare HMO | Admitting: Certified Registered Nurse Anesthetist

## 2019-05-16 ENCOUNTER — Other Ambulatory Visit: Payer: Self-pay

## 2019-05-16 ENCOUNTER — Ambulatory Visit
Admission: RE | Admit: 2019-05-16 | Discharge: 2019-05-16 | Disposition: A | Discharge status: Home / Self Care | Payer: Medicare HMO | Source: Ambulatory Visit | Attending: Surgery | Admitting: Surgery

## 2019-05-16 ENCOUNTER — Encounter: Payer: Self-pay | Admitting: *Deleted

## 2019-05-16 ENCOUNTER — Encounter
Admission: RE | Disposition: A | Discharge status: Home / Self Care | Payer: Self-pay | Source: Ambulatory Visit | Attending: Surgery

## 2019-05-16 DIAGNOSIS — K4020 Bilateral inguinal hernia, without obstruction or gangrene, not specified as recurrent: Secondary | ICD-10-CM | POA: Insufficient documentation

## 2019-05-16 DIAGNOSIS — K4090 Unilateral inguinal hernia, without obstruction or gangrene, not specified as recurrent: Secondary | ICD-10-CM

## 2019-05-16 DIAGNOSIS — I10 Essential (primary) hypertension: Secondary | ICD-10-CM | POA: Insufficient documentation

## 2019-05-16 DIAGNOSIS — Z955 Presence of coronary angioplasty implant and graft: Secondary | ICD-10-CM | POA: Insufficient documentation

## 2019-05-16 DIAGNOSIS — M545 Low back pain: Secondary | ICD-10-CM | POA: Insufficient documentation

## 2019-05-16 DIAGNOSIS — I252 Old myocardial infarction: Secondary | ICD-10-CM | POA: Insufficient documentation

## 2019-05-16 DIAGNOSIS — Z7982 Long term (current) use of aspirin: Secondary | ICD-10-CM | POA: Insufficient documentation

## 2019-05-16 DIAGNOSIS — Z20828 Contact with and (suspected) exposure to other viral communicable diseases: Secondary | ICD-10-CM | POA: Insufficient documentation

## 2019-05-16 DIAGNOSIS — Z79899 Other long term (current) drug therapy: Secondary | ICD-10-CM | POA: Insufficient documentation

## 2019-05-16 DIAGNOSIS — E785 Hyperlipidemia, unspecified: Secondary | ICD-10-CM | POA: Insufficient documentation

## 2019-05-16 DIAGNOSIS — F17210 Nicotine dependence, cigarettes, uncomplicated: Secondary | ICD-10-CM | POA: Insufficient documentation

## 2019-05-16 DIAGNOSIS — I2510 Atherosclerotic heart disease of native coronary artery without angina pectoris: Secondary | ICD-10-CM | POA: Insufficient documentation

## 2019-05-16 HISTORY — PX: XI ROBOTIC ASSISTED INGUINAL HERNIA REPAIR WITH MESH: SHX6706

## 2019-05-16 SURGERY — REPAIR, HERNIA, INGUINAL, ROBOT-ASSISTED, LAPAROSCOPIC, USING MESH
Anesthesia: General | Laterality: Right

## 2019-05-16 MED ORDER — PROPOFOL 10 MG/ML IV BOLUS
INTRAVENOUS | Status: DC | PRN
  Administered 2019-05-16: 130 mg via INTRAVENOUS

## 2019-05-16 MED ORDER — FENTANYL CITRATE (PF) 100 MCG/2ML IJ SOLN
INTRAMUSCULAR | Status: AC
  Filled 2019-05-16: qty 4

## 2019-05-16 MED ORDER — MIDAZOLAM HCL 2 MG/2ML IJ SOLN
INTRAMUSCULAR | Status: AC
  Filled 2019-05-16: qty 2

## 2019-05-16 MED ORDER — ACETAMINOPHEN 500 MG PO TABS
ORAL_TABLET | ORAL | Status: AC
  Administered 2019-05-16: 07:00:00 1000 mg via ORAL
  Filled 2019-05-16: qty 2

## 2019-05-16 MED ORDER — BUPIVACAINE-EPINEPHRINE 0.5% -1:200000 IJ SOLN
INTRAMUSCULAR | Status: DC | PRN
  Administered 2019-05-16: 15 mL

## 2019-05-16 MED ORDER — DEXAMETHASONE SODIUM PHOSPHATE 10 MG/ML IJ SOLN
INTRAMUSCULAR | Status: AC
  Filled 2019-05-16: qty 1

## 2019-05-16 MED ORDER — LIDOCAINE HCL (PF) 2 % IJ SOLN
INTRAMUSCULAR | Status: AC
  Filled 2019-05-16: qty 10

## 2019-05-16 MED ORDER — CHLORHEXIDINE GLUCONATE CLOTH 2 % EX PADS: 6.0000 | MEDICATED_PAD | Freq: Once | CUTANEOUS | Status: DC

## 2019-05-16 MED ORDER — EPHEDRINE SULFATE 50 MG/ML IJ SOLN
INTRAMUSCULAR | Status: AC
  Filled 2019-05-16: qty 1

## 2019-05-16 MED ORDER — OXYCODONE HCL 5 MG PO TABS
5.0000 mg | ORAL_TABLET | Freq: Once | ORAL | Status: AC | PRN
  Administered 2019-05-16: 5 mg via ORAL

## 2019-05-16 MED ORDER — DOCUSATE SODIUM 100 MG PO CAPS: 100.0000 mg | ORAL_CAPSULE | Freq: Two times a day (BID) | ORAL | 0 refills | Status: AC | PRN

## 2019-05-16 MED ORDER — LACTATED RINGERS IV SOLN
INTRAVENOUS | Status: DC
  Administered 2019-05-16 (×2): via INTRAVENOUS

## 2019-05-16 MED ORDER — FAMOTIDINE 20 MG PO TABS
ORAL_TABLET | ORAL | Status: AC
  Administered 2019-05-16: 20 mg via ORAL
  Filled 2019-05-16: qty 1

## 2019-05-16 MED ORDER — PROMETHAZINE HCL 25 MG/ML IJ SOLN
INTRAMUSCULAR | Status: AC
  Filled 2019-05-16: qty 1

## 2019-05-16 MED ORDER — SODIUM CHLORIDE FLUSH 0.9 % IV SOLN
INTRAVENOUS | Status: AC
  Filled 2019-05-16: qty 10

## 2019-05-16 MED ORDER — ACETAMINOPHEN 500 MG PO TABS
1000.0000 mg | ORAL_TABLET | ORAL | Status: AC
  Administered 2019-05-16: 07:00:00 1000 mg via ORAL

## 2019-05-16 MED ORDER — PROPOFOL 10 MG/ML IV BOLUS
INTRAVENOUS | Status: AC
  Filled 2019-05-16: qty 20

## 2019-05-16 MED ORDER — ONDANSETRON HCL 4 MG/2ML IJ SOLN
INTRAMUSCULAR | Status: AC
  Filled 2019-05-16: qty 2

## 2019-05-16 MED ORDER — ACETAMINOPHEN 325 MG PO TABS: 650.0000 mg | ORAL_TABLET | Freq: Three times a day (TID) | ORAL | 0 refills | Status: AC | PRN

## 2019-05-16 MED ORDER — DEXAMETHASONE SODIUM PHOSPHATE 10 MG/ML IJ SOLN
INTRAMUSCULAR | Status: DC | PRN
  Administered 2019-05-16: 5 mg via INTRAVENOUS

## 2019-05-16 MED ORDER — SUGAMMADEX SODIUM 200 MG/2ML IV SOLN
INTRAVENOUS | Status: DC | PRN
  Administered 2019-05-16: 260 mg via INTRAVENOUS

## 2019-05-16 MED ORDER — LIDOCAINE HCL (CARDIAC) PF 100 MG/5ML IV SOSY
PREFILLED_SYRINGE | INTRAVENOUS | Status: DC | PRN
  Administered 2019-05-16: 100 mg via INTRATRACHEAL

## 2019-05-16 MED ORDER — ONDANSETRON HCL 4 MG/2ML IJ SOLN
INTRAMUSCULAR | Status: DC | PRN
  Administered 2019-05-16: 4 mg via INTRAVENOUS

## 2019-05-16 MED ORDER — CLINDAMYCIN PHOSPHATE 900 MG/50ML IV SOLN
INTRAVENOUS | Status: AC
  Filled 2019-05-16: qty 50

## 2019-05-16 MED ORDER — BUPIVACAINE-EPINEPHRINE (PF) 0.5% -1:200000 IJ SOLN
INTRAMUSCULAR | Status: AC
  Filled 2019-05-16: qty 30

## 2019-05-16 MED ORDER — TRAMADOL HCL 50 MG PO TABS: 50.0000 mg | ORAL_TABLET | Freq: Four times a day (QID) | ORAL | 0 refills | Status: AC | PRN

## 2019-05-16 MED ORDER — FENTANYL CITRATE (PF) 100 MCG/2ML IJ SOLN
25.0000 ug | INTRAMUSCULAR | Status: DC | PRN
  Administered 2019-05-16 (×4): 25 ug via INTRAVENOUS

## 2019-05-16 MED ORDER — PROMETHAZINE HCL 25 MG/ML IJ SOLN: 6.2500 mg | Freq: Once | INTRAMUSCULAR | Status: DC

## 2019-05-16 MED ORDER — FAMOTIDINE 20 MG PO TABS
20.0000 mg | ORAL_TABLET | Freq: Once | ORAL | Status: AC
  Administered 2019-05-16: 07:00:00 20 mg via ORAL

## 2019-05-16 MED ORDER — OXYCODONE HCL 5 MG/5ML PO SOLN: 5.0000 mg | Freq: Once | ORAL | Status: AC | PRN

## 2019-05-16 MED ORDER — FENTANYL CITRATE (PF) 100 MCG/2ML IJ SOLN
INTRAMUSCULAR | Status: DC | PRN
  Administered 2019-05-16 (×2): 100 ug via INTRAVENOUS

## 2019-05-16 MED ORDER — ROCURONIUM BROMIDE 100 MG/10ML IV SOLN
INTRAVENOUS | Status: DC | PRN
  Administered 2019-05-16: 50 mg via INTRAVENOUS
  Administered 2019-05-16: 20 mg via INTRAVENOUS

## 2019-05-16 MED ORDER — BUPIVACAINE LIPOSOME 1.3 % IJ SUSP
INTRAMUSCULAR | Status: AC
  Filled 2019-05-16: qty 20

## 2019-05-16 MED ORDER — FENTANYL CITRATE (PF) 100 MCG/2ML IJ SOLN
INTRAMUSCULAR | Status: AC
  Administered 2019-05-16: 25 ug via INTRAVENOUS
  Filled 2019-05-16: qty 2

## 2019-05-16 MED ORDER — SODIUM CHLORIDE FLUSH 0.9 % IV SOLN
INTRAVENOUS | Status: AC
  Filled 2019-05-16: qty 20

## 2019-05-16 MED ORDER — MIDAZOLAM HCL 2 MG/2ML IJ SOLN
INTRAMUSCULAR | Status: DC | PRN
  Administered 2019-05-16: 2 mg via INTRAVENOUS

## 2019-05-16 MED ORDER — BUPIVACAINE LIPOSOME 1.3 % IJ SUSP
INTRAMUSCULAR | Status: DC | PRN
  Administered 2019-05-16: 20 mL

## 2019-05-16 MED ORDER — CELECOXIB 200 MG PO CAPS
200.0000 mg | ORAL_CAPSULE | ORAL | Status: AC
  Administered 2019-05-16: 07:00:00 200 mg via ORAL

## 2019-05-16 MED ORDER — IBUPROFEN 800 MG PO TABS: 800.0000 mg | ORAL_TABLET | Freq: Three times a day (TID) | ORAL | 0 refills | Status: DC | PRN

## 2019-05-16 MED ORDER — CELECOXIB 200 MG PO CAPS
ORAL_CAPSULE | ORAL | Status: AC
  Administered 2019-05-16: 200 mg via ORAL
  Filled 2019-05-16: qty 1

## 2019-05-16 MED ORDER — OXYCODONE HCL 5 MG PO TABS
ORAL_TABLET | ORAL | Status: AC
  Administered 2019-05-16: 5 mg via ORAL
  Filled 2019-05-16: qty 1

## 2019-05-16 MED ORDER — ROCURONIUM BROMIDE 50 MG/5ML IV SOLN
INTRAVENOUS | Status: AC
  Filled 2019-05-16: qty 1

## 2019-05-16 SURGICAL SUPPLY — 56 items
BLADE SURG SZ11 CARB STEEL (BLADE) ×3 IMPLANT
BNDG GAUZE 4.5X4.1 6PLY STRL (MISCELLANEOUS) ×1 IMPLANT
CANISTER SUCT 1200ML W/VALVE (MISCELLANEOUS) ×3 IMPLANT
CANNULA REDUC XI 12-8 STAPL (CANNULA) ×1
CANNULA REDUC XI 12-8MM STAPL (CANNULA) ×1
CANNULA REDUCER 12-8 DVNC XI (CANNULA) ×1 IMPLANT
CHLORAPREP W/TINT 26 (MISCELLANEOUS) ×3 IMPLANT
COVER TIP SHEARS 8 DVNC (MISCELLANEOUS) ×1 IMPLANT
COVER TIP SHEARS 8MM DA VINCI (MISCELLANEOUS) ×2
COVER WAND RF STERILE (DRAPES) ×3 IMPLANT
DEFOGGER SCOPE WARMER CLEARIFY (MISCELLANEOUS) ×3 IMPLANT
DERMABOND ADVANCED (GAUZE/BANDAGES/DRESSINGS) ×2
DERMABOND ADVANCED .7 DNX12 (GAUZE/BANDAGES/DRESSINGS) ×1 IMPLANT
DRAPE 3/4 80X56 (DRAPES) ×3 IMPLANT
DRAPE ARM DVNC X/XI (DISPOSABLE) ×4 IMPLANT
DRAPE COLUMN DVNC XI (DISPOSABLE) ×1 IMPLANT
DRAPE DA VINCI XI ARM (DISPOSABLE) ×8
DRAPE DA VINCI XI COLUMN (DISPOSABLE) ×2
ELECT REM PT RETURN 9FT ADLT (ELECTROSURGICAL) ×3
ELECTRODE REM PT RTRN 9FT ADLT (ELECTROSURGICAL) ×1 IMPLANT
ETHIBOND 2 0 GREEN CT 2 30IN (SUTURE) ×6 IMPLANT
GLOVE BIOGEL PI IND STRL 7.0 (GLOVE) ×2 IMPLANT
GLOVE BIOGEL PI INDICATOR 7.0 (GLOVE) ×6
GLOVE SURG SYN 6.5 ES PF (GLOVE) ×9 IMPLANT
GLOVE SURG SYN 6.5 PF PI (GLOVE) ×2 IMPLANT
GOWN STRL REUS W/ TWL LRG LVL3 (GOWN DISPOSABLE) ×3 IMPLANT
GOWN STRL REUS W/TWL LRG LVL3 (GOWN DISPOSABLE) ×8
GRASPER SUT TROCAR 14GX15 (MISCELLANEOUS) ×3 IMPLANT
IRRIGATOR SUCT 8 DISP DVNC XI (IRRIGATION / IRRIGATOR) IMPLANT
IRRIGATOR SUCTION 8MM XI DISP (IRRIGATION / IRRIGATOR) ×2
IV NS 1000ML (IV SOLUTION) ×2
IV NS 1000ML BAXH (IV SOLUTION) IMPLANT
KIT PINK PAD W/HEAD ARE REST (MISCELLANEOUS) ×3
KIT PINK PAD W/HEAD ARM REST (MISCELLANEOUS) ×1 IMPLANT
LABEL OR SOLS (LABEL) ×3 IMPLANT
MESH 3DMAX 4X6 RT LRG (Mesh General) ×3 IMPLANT
NEEDLE HYPO 22GX1.5 SAFETY (NEEDLE) ×3 IMPLANT
NEEDLE VERESS 14GA 120MM (NEEDLE) ×3 IMPLANT
OBTURATOR OPTICAL STANDARD 8MM (TROCAR) ×2
OBTURATOR OPTICAL STND 8 DVNC (TROCAR) ×1
OBTURATOR OPTICALSTD 8 DVNC (TROCAR) ×1 IMPLANT
PACK LAP CHOLECYSTECTOMY (MISCELLANEOUS) ×3 IMPLANT
SEAL CANN UNIV 5-8 DVNC XI (MISCELLANEOUS) ×2 IMPLANT
SEAL XI 5MM-8MM UNIVERSAL (MISCELLANEOUS) ×4
SOLUTION ELECTROLUBE (MISCELLANEOUS) ×3 IMPLANT
STAPLER CANNULA SEAL DVNC XI (STAPLE) ×1 IMPLANT
STAPLER CANNULA SEAL XI (STAPLE) ×2
SUT DVC VLOC 3-0 CL 6 P-12 (SUTURE) ×6 IMPLANT
SUT MNCRL AB 4-0 PS2 18 (SUTURE) ×3 IMPLANT
SUT VIC AB 3-0 SH 27 (SUTURE) ×2
SUT VIC AB 3-0 SH 27X BRD (SUTURE) ×1 IMPLANT
SUT VICRYL 0 AB UR-6 (SUTURE) ×3 IMPLANT
SYR 30ML LL (SYRINGE) ×3 IMPLANT
TRAY FOLEY MTR SLVR 16FR STAT (SET/KITS/TRAYS/PACK) ×3 IMPLANT
TROCAR XCEL NON-BLD 5MMX100MML (ENDOMECHANICALS) ×3 IMPLANT
TUBING EVAC SMOKE HEATED PNEUM (TUBING) ×3 IMPLANT

## 2019-05-16 NOTE — Anesthesia Post-op Follow-up Note (Signed)
Anesthesia QCDR form completed.        

## 2019-05-16 NOTE — Progress Notes (Signed)
Pt given 2 doses of 51mcg Fentanyl. After each dose pt has closed eyes and oxygen levels have dropped as low as 84% on room air. Pt placed on 2 liters nasal cannula. Pt given crackers to try and give a pain pill.

## 2019-05-16 NOTE — Anesthesia Preprocedure Evaluation (Addendum)
Anesthesia Evaluation  Patient identified by MRN, date of birth, ID band Patient awake    Reviewed: Allergy & Precautions, H&P , NPO status , Patient's Chart, lab work & pertinent test results  Airway Mallampati: II  TM Distance: >3 FB Neck ROM: full    Dental  (+) Poor Dentition, Chipped, Missing   Pulmonary neg pulmonary ROS, neg COPD, Current Smoker and Patient abstained from smoking.,           Cardiovascular Exercise Tolerance: Poor hypertension, (-) angina+ CAD, + Past MI and + Cardiac Stents (2014)  (-) dysrhythmias      Neuro/Psych PSYCHIATRIC DISORDERS Depression sciatica    GI/Hepatic negative GI ROS, Neg liver ROS,   Endo/Other  negative endocrine ROS  Renal/GU      Musculoskeletal   Abdominal   Peds  Hematology negative hematology ROS (+)   Anesthesia Other Findings Past Medical History: No date: Abscess, gluteal, left     Comment:  Recurrent drainage, followed by [facility]. No date: Arthritis No date: CAD (coronary artery disease)     Comment:  Stents in Clogged artery at [facility] June 2014 No date: Depression No date: Elevated lipids No date: Hyperlipidemia No date: Hypertension June 2014: Myocardial infarction [facility])  Past Surgical History: 08/30/2016: CARDIAC CATHETERIZATION; N/A     Comment:  Procedure: Left Heart Cath and Coronary Angiography;                Surgeon: Isaias Cowman, MD;  Location: [place] [facility];  Service: Cardiovascular;  Laterality:               N/A; 03/30/2013: CARDIAC SURGERY     Comment:  stent placement No date: CORONARY ANGIOPLASTY     Comment:  cardiac stents x 2 02/18/2018: LEFT HEART CATH AND CORS/GRAFTS ANGIOGRAPHY; N/A     Comment:  Procedure: LEFT HEART CATH AND CORS/GRAFTS ANGIOGRAPHY;               Surgeon: Teodoro Spray, MD;  Location: [place] [facility];  Service: Cardiovascular;  Laterality: N/A; Anal Fissure: RECTAL  SURGERY  BMI    Body Mass Index: 26.61 kg/m      Reproductive/Obstetrics negative OB ROS                            Anesthesia Physical Anesthesia Plan  ASA: III  Anesthesia Plan: General ETT   Post-op Pain Management:    Induction:   PONV Risk Score and Plan: Ondansetron, Dexamethasone, Midazolam and Treatment may vary due to age or medical condition  Airway Management Planned:   Additional Equipment:   Intra-op Plan:   Post-operative Plan:   Informed Consent: I have reviewed the patients History and Physical, chart, labs and discussed the procedure including the risks, benefits and alternatives for the proposed anesthesia with the patient or authorized representative who has indicated his/her understanding and acceptance.     Dental Advisory Given  Plan Discussed with: Anesthesiologist and CRNA  Anesthesia Plan Comments:         Anesthesia Quick Evaluation

## 2019-05-16 NOTE — Anesthesia Postprocedure Evaluation (Signed)
Anesthesia Post Note  Patient: Sylvia Wiggins  Procedure(s) Performed: XI ROBOTIC ASSISTED Right INGUINAL HERNIA REPAIR WITH MESH (Right )  Patient location during evaluation: PACU Anesthesia Type: General Level of consciousness: awake and alert Pain management: pain level controlled Vital Signs Assessment: post-procedure vital signs reviewed and stable Respiratory status: spontaneous breathing, nonlabored ventilation and respiratory function stable Cardiovascular status: blood pressure returned to baseline and stable Postop Assessment: no apparent nausea or vomiting Anesthetic complications: no     Last Vitals:  Vitals:   05/16/19 1255 05/16/19 1304  BP: 128/85 (!) 134/94  Pulse:  62  Resp:  14  Temp: 36.6 C 36.7 C  SpO2:  97%    Last Pain:  Vitals:   05/16/19 1304  TempSrc: Temporal  PainSc: [place]

## 2019-05-16 NOTE — Op Note (Signed)
Preoperative diagnosis: bilateral inguinal Hernia.  Postoperative diagnosis: right direct inguinal Hernia  Procedure: Robotic assisted laparoscopic right direct inguinal hernia repair with mesh  Anesthesia: General  Surgeon: Dr. Lysle Pearl  Wound Classification: Clean  Specimen: none  Complications: None  Estimated Blood Loss: 28mL   Indications:  inguinal hernia. Repair was indicated to avoid complications of incarceration, obstruction and pain, and a prosthetic mesh repair was elected.  See H&P for further details.  Findings: 1. right direct inguinal hernia.  No evidence of left inguinal hernia upon examination. 2. Bard 3D max mesh used for repair 3. Adequate hemostasis achieved   Description of procedure: The patient was taken to the operating room. A time-out was completed verifying correct patient, procedure, site, positioning, and implant(s) and/or special equipment prior to beginning this procedure.  right groin was prepped and draped in the usual sterile fashion. An incision was marked 20 cm above the pubic tubercle, slightly above the umbilicus  Foley catheter placed.  Incision was made at the previously marked site after injecting local anesthesia.  Dissection carried down to the fascia where at this point a Veress needle was inserted.  Saline drop test noted to be positive with gradual increase in pressure after initiation of gas insufflation.  15 mm of pressure was achieved prior to removing the Veress needle and then placing a 5 mm port via the Optiview technique through the previous needle insertion site.  Inspection of the area afterwards noted no injury to the surrounding organs during insertion of the needle and the Optiview port.  2 port sites were marked 8 cm to the lateral sides of the initial port, and a 8 mm robotic port was placed on the left side, 12 mm robotic port on the right side under direct supervision.  Local anesthesia  infused to the preplanned incision site  prior to insertion of the port, and exparel infused as an ilioinguinal block under direct visualization. The [place] was then brought into the operative field and docked to the ports.  Examination of the abdominal cavity noted a large right direct inguinal hernia.  A peritoneal flap was created approximately 8cm cephalad to the defect by using scissors with electrocautery.  Dissection was carried down towards the pubic tubercle, developing the myopectineal orifice view.  Laterally the flap was carried towards the ASIS.  Hernia sac was noted, which carefully dissected away from the adjacent tissues to be fully reduced out of hernia cavity.  Any bleeding was controlled with combination of electrocautery and manual pressure.    After confirming adequate dissection and the peritoneal reflection completely down and away from the cord structures, a Bard 3DMax large mesh was placed within the anterior abdominal wall, secured in place using 2-0 Vicryl on an SH needle immediately above the pubic tubercle.  After noting proper placement of the mesh with the peritoneal reflection deep to it, the previously created peritoneal flap was secured back up to the anterior abdominal wall using running 3-0 V-Lock on a noncutting needle.  Both needles were then removed out of the abdominal cavity, Xi platform undocked from the ports and removed off of operative field.  The 12 mm port site closed with PMI device using 0 Vicryl, using the robotic camera.  Abdomen then desufflated and remaining ports removed.  All the skin incisions were then closed with a subcuticular stitch of Monocryl 4-0. Dermabond was applied. Foley catheter removed. The patient tolerated the procedure well and was taken to the postanesthesia care  unit in stable condition. Sponge and instrument count correct at end of procedure.

## 2019-05-16 NOTE — Anesthesia Procedure Notes (Signed)
Epidural

## 2019-05-16 NOTE — Transfer of Care (Signed)
Immediate Anesthesia Transfer of Care Note  Patient: Sylvia Wiggins  Procedure(s) Performed: XI ROBOTIC ASSISTED Right INGUINAL HERNIA REPAIR WITH MESH (Right )  Patient Location: PACU  Anesthesia Type:General  Level of Consciousness: awake  Airway & Oxygen Therapy: Patient Spontanous Breathing and Patient connected to face mask oxygen  Post-op Assessment: Report given to RN and Post -op Vital signs reviewed and stable  Post vital signs: Reviewed and stable  Last Vitals:  Vitals Value Taken Time  BP 146/99 05/16/19 1140  Temp 36.2 C 05/16/19 1140  Pulse 82 05/16/19 1144  Resp 20 05/16/19 1144  SpO2 100 % 05/16/19 1144  Vitals shown include unvalidated device data.  Last Pain:  Vitals:   05/16/19 0620  PainSc: 0-No pain         Complications: No apparent anesthesia complications

## 2019-05-16 NOTE — Progress Notes (Signed)
Pt c/o nausea. Dr. Ola Spurr notified. Acknowledged. Orders received. See [facility]

## 2019-05-16 NOTE — Progress Notes (Signed)
Ch visited pt in pre-op. Pt shared that she will be having two hernias removed. Pt is a 58 y.o. female that is only disability since 2014 due to having heart issues. Pt has had 2 heart attacks in the pass. Pt presented to have a positive affect and was alert and oriented upon ch arrival. Ch allowed space for the pt to share her concerns about her health and her faith in "the great physician" (God) to help her through the surgery. Ch provided a compassionate presence. Pt appreciated visit.  No further needs at this time.    05/16/19 0800  Clinical Encounter Type  Visited With Patient  Visit Type Spiritual support;Social support;Pre-op  Spiritual Encounters  Spiritual Needs Emotional;Grief support  Stress Factors  Patient Stress Factors Health changes;Major life changes  Family Stress Factors None identified

## 2019-05-16 NOTE — Anesthesia Procedure Notes (Signed)
Procedure Name: Intubation Date/Time: 05/16/2019 8:42 AM Performed by: Babs Sciara, CRNA Pre-anesthesia Checklist: Emergency Drugs available, Patient identified, Suction available, Patient being monitored and Timeout performed Patient Re-evaluated:Patient Re-evaluated prior to induction Oxygen Delivery Method: Circle system utilized Preoxygenation: Pre-oxygenation with 100% oxygen Induction Type: IV induction Ventilation: Mask ventilation without difficulty and Oral airway inserted - appropriate to patient size Laryngoscope Size: Mac and 3 Grade View: Grade I Tube type: Oral Tube size: 7.5 mm Number of attempts: 1 Airway Equipment and Method: Stylet Placement Confirmation: ETT inserted through vocal cords under direct vision,  positive ETCO2 and breath sounds checked- equal and bilateral Secured at: 20 (lip) cm Tube secured with: Tape Dental Injury: Teeth and Oropharynx as per pre-operative assessment

## 2019-05-16 NOTE — Discharge Instructions (Addendum)
Hernia repair, Care After This sheet gives you information about how to care for yourself after your procedure. Your health care provider may also give you more specific instructions. If you have problems or questions, contact your health care provider. What can I expect after the procedure? After your procedure, it is common to have the following:  Pain in your abdomen, especially in the incision areas. You will be given medicine to control the pain.  Tiredness. This is a normal part of the recovery process. Your energy level will return to normal over the next several weeks.  Changes in your bowel movements, such as constipation or needing to go more often. Talk with your health care provider about how to manage this. Follow these instructions at home: Medicines   tylenol and advil as needed for discomfort.  Please alternate between the two every four hours as needed for pain.     Use narcotics, if prescribed, only when tylenol and motrin is not enough to control pain.   325-650mg  every 8hrs to max of 3000mg /24hrs for the tylenol.     Advil up to 800mg  per dose every 8hrs as needed for pain.    PLEASE RECORD NUMBER OF PILLS TAKEN UNTIL NEXT FOLLOW UP APPT.  THIS WILL HELP DETERMINE HOW READY YOU ARE TO BE RELEASED FROM ANY ACTIVITY RESTRICTIONS  RESUME PLAVIX 72HRS AFTER SURGERY  Do not drive or use heavy machinery while taking prescription pain medicine.  Do not drink alcohol while taking prescription pain medicine.  Incision care     Follow instructions from your health care provider about how to take care of your incision areas. Make sure you: ? Keep your incisions clean and dry. ? Wash your hands with soap and water before and after applying medicine to the areas, and before and after changing your bandage (dressing). If soap and water are not available, use hand sanitizer. ? Change your dressing as told by your health care provider. ? Leave stitches (sutures), skin glue,  or adhesive strips in place. These skin closures may need to stay in place for 2 weeks or longer. If adhesive strip edges start to loosen and curl up, you may trim the loose edges. Do not remove adhesive strips completely unless your health care provider tells you to do that.  Do not wear tight clothing over the incisions. Tight clothing may rub and irritate the incision areas, which may cause the incisions to open.  Do not take baths, swim, or use a hot tub until your health care provider approves. OK TO SHOWER IN 24HRS.    Check your incision area every day for signs of infection. Check for: ? More redness, swelling, or pain. ? More fluid or blood. ? Warmth. ? Pus or a bad smell. Activity  Avoid lifting anything that is heavier than 10 lb (4.5 kg) for 2 weeks or until your health care provider says it is okay.  No pushing/pulling greater than 30lbs  You may resume normal activities as told by your health care provider. Ask your health care provider what activities are safe for you.  Take rest breaks during the day as needed. Eating and drinking  Follow instructions from your health care provider about what you can eat after surgery.  To prevent or treat constipation while you are taking prescription pain medicine, your health care provider may recommend that you: ? Drink enough fluid to keep your urine clear or pale yellow. ? Take over-the-counter or prescription medicines. ? Eat  foods that are high in fiber, such as fresh fruits and vegetables, whole grains, and beans. ? Limit foods that are high in fat and processed sugars, such as fried and sweet foods. General instructions  Ask your health care provider when you will need an appointment to get your sutures or staples removed.  Keep all follow-up visits as told by your health care provider. This is important. Contact a health care provider if:  You have more redness, swelling, or pain around your incisions.  You have more  fluid or blood coming from the incisions.  Your incisions feel warm to the touch.  You have pus or a bad smell coming from your incisions or your dressing.  You have a fever.  You have an incision that breaks open (edges not staying together) after sutures or staples have been removed. Get help right away if:  You develop a rash.  You have chest pain or difficulty breathing.  You have pain or swelling in your legs.  You feel light-headed or you faint.  Your abdomen swells (becomes distended).  You have nausea or vomiting.  You have blood in your stool (feces). This information is not intended to replace advice given to you by your health care provider. Make sure you discuss any questions you have with your health care provider. Document Released: 04/14/2005 Document Revised: 06/14/2018 Document Reviewed: 06/26/2016 Elsevier Interactive Patient Education  2019 [employer]  AMBULATORY SURGERY  DISCHARGE INSTRUCTIONS   1) The drugs that you were given will stay in your system until tomorrow so for the next 24 hours you should not:  A) Drive an automobile B) Make any legal decisions C) Drink any alcoholic beverage   2) You may resume regular meals tomorrow.  Today it is better to start with liquids and gradually work up to solid foods.  You may eat anything you prefer, but it is better to start with liquids, then soup and crackers, and gradually work up to solid foods.   3) Please notify your doctor immediately if you have any unusual bleeding, trouble breathing, redness and pain at the surgery site, drainage, fever, or pain not relieved by medication.    4) Additional Instructions:        Please contact your physician with any problems or Same Day Surgery at [phone], Monday through Friday 6 am to 4 pm, or [place] at [facility] Main number at [phone].

## 2019-05-16 NOTE — Interval H&P Note (Signed)
History and Physical Interval Note:  05/16/2019 7:00 AM  Sylvia Wiggins  has presented today for surgery, with the diagnosis of K40.20 BILATERAL INGUINAL HERNIA W/O OBSTRUCTION OR GANGRENE.  The various methods of treatment have been discussed with the patient and family. After consideration of risks, benefits and other options for treatment, the patient has consented to  Procedure(s): XI ROBOTIC [place] (N/A) as a surgical intervention.  The patient's history has been reviewed, patient examined, no change in status, stable for surgery.  I have reviewed the patient's chart and labs.  Questions were answered to the patient's satisfaction.    CT showed bilateral inguinal hernia, will proceed with bilateral repair.  Discussed the risk of surgery including recurrence, which can be up to 50% in the case of incisional or complex hernias, possible use of prosthetic materials (mesh) and the increased risk of mesh infxn if used, bleeding, chronic pain, post-op infxn, post-op SBO or ileus, and possible re-operation to address said risks. The risks of general anesthetic, if used, includes MI, CVA, sudden death or even reaction to anesthetic medications also discussed. Alternatives include continued observation.  Benefits include possible symptom relief, prevention of incarceration, strangulation, enlargement in size over time, and the risk of emergency surgery in the face of strangulation.  Typical post-op recovery time of 3-5 days with 4-6 weeks of activity restrictions were also discussed.  The patient verbalized understanding and all questions were answered to the patient's satisfaction.     Buelah Rennie Lysle Pearl

## 2019-05-18 ENCOUNTER — Encounter: Payer: Self-pay | Admitting: Surgery

## 2019-07-30 ENCOUNTER — Other Ambulatory Visit: Payer: Self-pay | Admitting: Surgery

## 2019-07-30 DIAGNOSIS — R1031 Right lower quadrant pain: Secondary | ICD-10-CM

## 2019-07-31 ENCOUNTER — Other Ambulatory Visit: Payer: Self-pay | Admitting: Surgery

## 2019-07-31 DIAGNOSIS — S301XXA Contusion of abdominal wall, initial encounter: Secondary | ICD-10-CM

## 2019-07-31 DIAGNOSIS — K469 Unspecified abdominal hernia without obstruction or gangrene: Secondary | ICD-10-CM

## 2019-07-31 DIAGNOSIS — T8149XA Infection following a procedure, other surgical site, initial encounter: Secondary | ICD-10-CM

## 2019-07-31 DIAGNOSIS — R1031 Right lower quadrant pain: Secondary | ICD-10-CM

## 2019-08-13 ENCOUNTER — Ambulatory Visit
Admission: RE | Admit: 2019-08-13 | Discharge: 2019-08-13 | Disposition: A | Discharge status: Home / Self Care | Payer: Medicare HMO | Source: Ambulatory Visit | Attending: Surgery | Admitting: Surgery

## 2019-08-13 ENCOUNTER — Other Ambulatory Visit: Payer: Self-pay

## 2019-08-13 DIAGNOSIS — T8149XA Infection following a procedure, other surgical site, initial encounter: Secondary | ICD-10-CM | POA: Diagnosis present

## 2019-08-13 DIAGNOSIS — K469 Unspecified abdominal hernia without obstruction or gangrene: Secondary | ICD-10-CM | POA: Diagnosis present

## 2019-08-13 DIAGNOSIS — S301XXA Contusion of abdominal wall, initial encounter: Secondary | ICD-10-CM | POA: Insufficient documentation

## 2019-08-13 DIAGNOSIS — R1031 Right lower quadrant pain: Secondary | ICD-10-CM | POA: Diagnosis not present

## 2019-08-13 LAB — POCT I-STAT CREATININE: Creatinine, Ser: 0.9 mg/dL (ref 0.44–1.00)

## 2019-08-13 MED ORDER — IOHEXOL 300 MG/ML  SOLN
100.0000 mL | Freq: Once | INTRAMUSCULAR | Status: AC | PRN
  Administered 2019-08-13: 100 mL via INTRAVENOUS

## 2019-12-03 ENCOUNTER — Emergency Department
Admission: EM | Admit: 2019-12-03 | Discharge: 2019-12-03 | Disposition: A | Discharge status: Home / Self Care | Payer: Medicare HMO | Attending: Emergency Medicine | Admitting: Emergency Medicine

## 2019-12-03 ENCOUNTER — Encounter: Payer: Self-pay | Admitting: Emergency Medicine

## 2019-12-03 ENCOUNTER — Other Ambulatory Visit: Payer: Self-pay

## 2019-12-03 ENCOUNTER — Emergency Department: Payer: Medicare HMO

## 2019-12-03 DIAGNOSIS — M542 Cervicalgia: Secondary | ICD-10-CM | POA: Diagnosis present

## 2019-12-03 DIAGNOSIS — I10 Essential (primary) hypertension: Secondary | ICD-10-CM | POA: Diagnosis not present

## 2019-12-03 DIAGNOSIS — I252 Old myocardial infarction: Secondary | ICD-10-CM | POA: Insufficient documentation

## 2019-12-03 DIAGNOSIS — Z79899 Other long term (current) drug therapy: Secondary | ICD-10-CM | POA: Diagnosis not present

## 2019-12-03 DIAGNOSIS — M5412 Radiculopathy, cervical region: Secondary | ICD-10-CM | POA: Insufficient documentation

## 2019-12-03 DIAGNOSIS — I2510 Atherosclerotic heart disease of native coronary artery without angina pectoris: Secondary | ICD-10-CM | POA: Insufficient documentation

## 2019-12-03 DIAGNOSIS — F17210 Nicotine dependence, cigarettes, uncomplicated: Secondary | ICD-10-CM | POA: Diagnosis not present

## 2019-12-03 LAB — CBC
HCT: 46.6 % — ABNORMAL HIGH (ref 36.0–46.0)
Hemoglobin: 16.2 g/dL — ABNORMAL HIGH (ref 12.0–15.0)
MCH: 33.9 pg (ref 26.0–34.0)
MCHC: 34.8 g/dL (ref 30.0–36.0)
MCV: 97.5 fL (ref 80.0–100.0)
Platelets: 229 10*3/uL (ref 150–400)
RBC: 4.78 MIL/uL (ref 3.87–5.11)
RDW: 11.7 % (ref 11.5–15.5)
WBC: 7.2 10*3/uL (ref 4.0–10.5)
nRBC: 0 % (ref 0.0–0.2)

## 2019-12-03 LAB — HEPATIC FUNCTION PANEL
ALT: 16 U/L (ref 0–44)
AST: 17 U/L (ref 15–41)
Albumin: 4.1 g/dL (ref 3.5–5.0)
Alkaline Phosphatase: 82 U/L (ref 38–126)
Bilirubin, Direct: <0.1 mg/dL (ref 0.0–0.2)
Total Bilirubin: 0.9 mg/dL (ref 0.3–1.2)
Total Protein: 7.1 g/dL (ref 6.5–8.1)

## 2019-12-03 LAB — BASIC METABOLIC PANEL
Anion gap: 6 (ref 5–15)
BUN: 10 mg/dL (ref 6–20)
CO2: 28 mmol/L (ref 22–32)
Calcium: 9.6 mg/dL (ref 8.9–10.3)
Chloride: 103 mmol/L (ref 98–111)
Creatinine, Ser: 0.8 mg/dL (ref 0.44–1.00)
GFR calc Af Amer: >60 mL/min (ref >60)
GFR calc non Af Amer: >60 mL/min (ref >60)
Glucose, Bld: 132 mg/dL — ABNORMAL HIGH (ref 70–99)
Potassium: 3.5 mmol/L (ref 3.5–5.1)
Sodium: 137 mmol/L (ref 135–145)

## 2019-12-03 LAB — LIPASE, BLOOD: Lipase: 47 U/L (ref 11–51)

## 2019-12-03 LAB — TROPONIN I (HIGH SENSITIVITY)
Troponin I (High Sensitivity): 3 ng/L (ref <18)
Troponin I (High Sensitivity): 4 ng/L (ref <18)

## 2019-12-03 LAB — FIBRIN DERIVATIVES D-DIMER (ARMC ONLY): Fibrin derivatives D-dimer (ARMC): 410.78 ng/mL (FEU) (ref 0.00–499.00)

## 2019-12-03 MED ORDER — OXYCODONE-ACETAMINOPHEN 5-325 MG PO TABS
2.0000 | ORAL_TABLET | Freq: Once | ORAL | Status: AC
  Administered 2019-12-03: 19:00:00 2 via ORAL
  Filled 2019-12-03: qty 2

## 2019-12-03 MED ORDER — MORPHINE SULFATE (PF) 4 MG/ML IV SOLN
6.0000 mg | Freq: Once | INTRAVENOUS | Status: AC
  Administered 2019-12-03: 17:00:00 6 mg via INTRAVENOUS
  Filled 2019-12-03: qty 2

## 2019-12-03 MED ORDER — PREDNISONE 20 MG PO TABS: 40.0000 mg | ORAL_TABLET | Freq: Every day | ORAL | 0 refills | Status: AC

## 2019-12-03 MED ORDER — KETOROLAC TROMETHAMINE 30 MG/ML IJ SOLN
15.0000 mg | Freq: Once | INTRAMUSCULAR | Status: AC
  Administered 2019-12-03: 17:00:00 15 mg via INTRAVENOUS
  Filled 2019-12-03: qty 1

## 2019-12-03 MED ORDER — OXYCODONE-ACETAMINOPHEN 5-325 MG PO TABS: 1.0000 | ORAL_TABLET | Freq: Four times a day (QID) | ORAL | 0 refills | Status: DC | PRN

## 2019-12-03 MED ORDER — ONDANSETRON HCL 4 MG/2ML IJ SOLN
4.0000 mg | Freq: Once | INTRAMUSCULAR | Status: AC
  Administered 2019-12-03: 17:00:00 4 mg via INTRAVENOUS
  Filled 2019-12-03: qty 2

## 2019-12-03 MED ORDER — DEXAMETHASONE SODIUM PHOSPHATE 10 MG/ML IJ SOLN
6.0000 mg | Freq: Once | INTRAMUSCULAR | Status: AC
  Administered 2019-12-03: 19:00:00 6 mg via INTRAVENOUS
  Filled 2019-12-03: qty 1

## 2019-12-03 NOTE — ED Notes (Signed)
Pt given graham crackers, peanut butter and ice water

## 2019-12-03 NOTE — ED Notes (Signed)
Pt assisted with ambulation to toilet, NAD noted, steady gait

## 2019-12-03 NOTE — ED Notes (Signed)
Pt reports tolerating eating and drinking ok

## 2019-12-03 NOTE — ED Provider Notes (Signed)
[facility] Emergency Department Provider Note  ____________________________________________   First MD Initiated Contact with Patient 12/03/19 1546     (approximate)  I have reviewed the triage vital signs and the nursing notes.   HISTORY  Chief Complaint Chest Pain    HPI Sylvia Wiggins is a 59 y.o. female  With h/o HTN, HLD, MI, here with chest pain. PT reports that for the past several days, she has had intermittent, increasingly severe, right upper neck and shoulder pain, radiating down towards her right lateral chest.  She states the pain seems to come and go randomly, and is intermittently very severe.  She has some occasional tingling down the right arm.  She has had some occasional tingling on her left arm as well.  She does not recall specific trauma or incident leading to the onset of pain.  It is not necessarily exertional.  She has had shortness of breath with it, which she feels is more so due to the severity of the pain.  No cough.  No right upper quadrant pain.  No nausea or vomiting.  She states that some of the neck pressure feels like her prior MI, though that was more severe, associated w/ diaphoresis, and more so left sided.       Past Medical History:  Diagnosis Date  . Abscess, gluteal, left    Recurrent drainage, followed by [facility]  . Arthritis   . CAD (coronary artery disease)    Stents in Clogged artery at [facility] June 2014  . Depression   . Elevated lipids   . Hyperlipidemia   . Hypertension   . Myocardial infarction [facility]) June 2014    Patient Active Problem List   Diagnosis Date Noted  . HTN (hypertension) 02/16/2018  . Abnormal EKG 02/16/2018  . Chest pain 02/16/2018  . Chest pain, rule out acute myocardial infarction 08/30/2016  . NSTEMI (non-ST elevated myocardial infarction) (HCC) 08/30/2016  . CAD (coronary artery disease) 01/26/2016  . Hypertension 01/26/2016  . Depression 01/26/2016  . Hyperlipidemia 01/26/2016    . Tremor of both hands 01/26/2016  . Chronic right-sided low back pain with right-sided sciatica 01/26/2016  . Major depression 10/05/2015    Past Surgical History:  Procedure Laterality Date  . CARDIAC CATHETERIZATION N/A 08/30/2016   Procedure: Left Heart Cath and Coronary Angiography;  Surgeon: Marcina Millard, MD;  Location: [facility] INVASIVE CV LAB;  Service: Cardiovascular;  Laterality: N/A;  . CARDIAC SURGERY  03/30/2013   stent placement  . CORONARY ANGIOPLASTY     cardiac stents x 2  . LEFT HEART CATH AND CORS/GRAFTS ANGIOGRAPHY N/A 02/18/2018   Procedure: LEFT HEART CATH AND CORS/GRAFTS ANGIOGRAPHY;  Surgeon: Dalia Heading, MD;  Location: [facility] INVASIVE CV LAB;  Service: Cardiovascular;  Laterality: N/A;  . RECTAL SURGERY  Anal Fissure  . XI ROBOTIC ASSISTED INGUINAL HERNIA REPAIR WITH MESH Right 05/16/2019   Procedure: XI ROBOTIC ASSISTED Right INGUINAL HERNIA REPAIR WITH MESH;  Surgeon: Sung Amabile, DO;  Location: [facility] ORS;  Service: General;  Laterality: Right;    Prior to Admission medications   Medication Sig Start Date End Date Taking? Authorizing Provider  cholecalciferol (VITAMIN D) 1000 units tablet Take 1,000 Units by mouth daily.    [provider]  clopidogrel (PLAVIX) 75 MG tablet Take 1 tablet (75 mg total) by mouth daily with breakfast. Patient not taking: Reported on 05/08/2019 09/01/16   Hower, Cletis Athens, MD  ibuprofen (ADVIL) 800 MG tablet Take 1  tablet (800 mg total) by mouth every 8 (eight) hours as needed for mild pain or moderate pain. 05/16/19   Tonna Boehringer, Isami, DO  nitroGLYCERIN (NITROSTAT) 0.4 MG SL tablet Place 1 tablet (0.4 mg total) under the tongue every 5 (five) minutes as needed for chest pain. Patient not taking: Reported on 05/16/2019 08/31/16   Hower, Cletis Athens, MD  oxyCODONE-acetaminophen (PERCOCET) 5-325 MG tablet Take 1 tablet by mouth every 6 (six) hours as needed for moderate pain or severe pain. 12/03/19 12/02/20  Shaune Pollack, MD   predniSONE (DELTASONE) 20 MG tablet Take 2 tablets (40 mg total) by mouth daily for 5 days. 12/04/19 12/09/19  Shaune Pollack, MD  simvastatin (ZOCOR) 40 MG tablet Take 1 tablet (40 mg total) by mouth daily at 6 PM. 01/26/16   Ellyn Hack, MD  venlafaxine XR (EFFEXOR-XR) 75 MG 24 hr capsule Take 1 capsule (75 mg total) by mouth daily with breakfast. Patient not taking: Reported on 05/08/2019 01/26/16   Ellyn Hack, MD    Allergies Penicillins  Family History  Problem Relation Age of Onset  . Cancer Mother        colon  . Heart disease Father        Died of Heart Attack    Social History Social History   Tobacco Use  . Smoking status: Current Every Day Smoker    Packs/day: 0.25    Types: Cigarettes  . Smokeless tobacco: Never Used  Substance Use Topics  . Alcohol use: No    Alcohol/week: 0.0 standard drinks  . Drug use: No    Review of Systems  Review of Systems  Constitutional: Positive for fatigue. Negative for fever.  HENT: Negative for congestion and sore throat.   Eyes: Negative for visual disturbance.  Respiratory: Positive for chest tightness. Negative for cough and shortness of breath.   Cardiovascular: Positive for chest pain.  Gastrointestinal: Negative for abdominal pain, diarrhea, nausea and vomiting.  Genitourinary: Negative for flank pain.  Musculoskeletal: Positive for neck pain. Negative for back pain.  Skin: Negative for rash and wound.  Neurological: Positive for numbness. Negative for weakness.  All other systems reviewed and are negative.    ____________________________________________  PHYSICAL EXAM:      VITAL SIGNS: ED Triage Vitals  Enc Vitals Group     BP 12/03/19 1438 (!) 158/90     Pulse Rate 12/03/19 1438 75     Resp 12/03/19 1438 18     Temp 12/03/19 1438 98 F (36.7 C)     Temp Source 12/03/19 1438 Oral     SpO2 12/03/19 1438 100 %     Weight 12/03/19 1440 135 lb (61.2 kg)     Height 12/03/19 1440 5\' 2"  (1.575 m)      Head Circumference --      Peak Flow --      Pain Score 12/03/19 1443 10     Pain Loc --      Pain Edu? --      Excl. in GC? --      Physical Exam Vitals and nursing note reviewed.  Constitutional:      General: She is not in acute distress.    Appearance: She is well-developed.  HENT:     Head: Normocephalic and atraumatic.  Eyes:     Conjunctiva/sclera: Conjunctivae normal.  Neck:     Comments: Significant midline and R paraspinal TTP along lower cervical spine, with +Spurling and reproduction of tingling/numbness along  distribution of pain on right. Cardiovascular:     Rate and Rhythm: Normal rate and regular rhythm.     Heart sounds: Normal heart sounds.  Pulmonary:     Effort: Pulmonary effort is normal. No respiratory distress.     Breath sounds: No wheezing.  Abdominal:     General: There is no distension.  Musculoskeletal:     Cervical back: Neck supple.  Skin:    General: Skin is warm.     Capillary Refill: Capillary refill takes less than 2 seconds.     Findings: No rash.  Neurological:     Mental Status: She is alert and oriented to person, place, and time.     Motor: No abnormal muscle tone.     Comments: Strength 5/5 proximally and distally bilateral UE and LE. Normal sensation to light touch. CN intact.       ____________________________________________   LABS (all labs ordered are listed, but only abnormal results are displayed)  Labs Reviewed  BASIC METABOLIC PANEL - Abnormal; Notable for the following components:      Result Value   Glucose, Bld 132 (*)    All other components within normal limits  CBC - Abnormal; Notable for the following components:   Hemoglobin 16.2 (*)    HCT 46.6 (*)    All other components within normal limits  HEPATIC FUNCTION PANEL  LIPASE, BLOOD  FIBRIN DERIVATIVES D-DIMER ([facility] ONLY)  TROPONIN I (HIGH SENSITIVITY)  TROPONIN I (HIGH SENSITIVITY)    ____________________________________________  EKG: Normal  sinus rhythm, VR 76. PR 154, QRS 66, QTc 416. No acute ST elevations or depressions. ________________________________________  RADIOLOGY All imaging, including plain films, CT scans, and ultrasounds, independently reviewed by me, and interpretations confirmed via formal radiology reads.  ED MD interpretation:   CXR: CLear, no focal abnormality XR C-Spine: C5-C6 retrolisthesis, mild degen changes  Official radiology report(s): DG Chest 2 View  Result Date: 12/03/2019 CLINICAL DATA:  Chest pain EXAM: CHEST - 2 VIEW COMPARISON:  Feb 16, 2018 FINDINGS: Lungs are clear. Heart size and pulmonary vascularity are normal. No adenopathy. There is an apparent stent in the left anterior descending coronary artery. No pneumothorax. No bone lesions. IMPRESSION: Stent in left anterior descending coronary artery. Heart size normal. Lungs clear. Electronically Signed   By: Bretta Bang III M.D.   On: 12/03/2019 16:21   DG Cervical Spine Complete  Result Date: 12/03/2019 CLINICAL DATA:  Neck pain EXAM: CERVICAL SPINE - COMPLETE 4+ VIEW COMPARISON:  CT 01/28/2019 FINDINGS: Trace retrolisthesis C5 on C6. Vertebral body heights are normal. Normal prevertebral soft tissue thickness. Normal dens and lateral masses. Mild disc space narrowing C4-C5 and C5-C6. Multiple mild foraminal narrowing bilaterally at the mid cervical spine. Carotid vascular calcification IMPRESSION: 1.  No acute osseous abnormality. 2. Trace retrolisthesis C5 on C6 with mild degenerative changes C4-C6. 3.  Carotid vascular calcification. Electronically Signed   By: Jasmine Pang M.D.   On: 12/03/2019 17:06    ____________________________________________  PROCEDURES   Procedure(s) performed (including Critical Care):  Procedures  ____________________________________________  INITIAL IMPRESSION / MDM / ASSESSMENT AND PLAN / ED COURSE  As part of my medical decision making, I reviewed the following data within the electronic medical  record:  Nursing notes reviewed and incorporated, Old chart reviewed, Notes from prior ED visits, and [place] Controlled Substance Database       *Sylvia Wiggins was evaluated in Emergency Department on 12/03/2019 for the symptoms described in the history  of present illness. She was evaluated in the context of the global COVID-19 pandemic, which necessitated consideration that the patient might be at risk for infection with the SARS-CoV-2 virus that causes COVID-19. Institutional protocols and algorithms that pertain to the evaluation of patients at risk for COVID-19 are in a state of rapid change based on information released by regulatory bodies including the CDC and federal and state organizations. These policies and algorithms were followed during the patient's care in the ED.  Some ED evaluations and interventions may be delayed as a result of limited staffing during the pandemic.*     Medical Decision Making:  59 yo F here with R sided upper neck/shoulder pain. H/o CAD but EKG is nonischemic, CP is highly atypical (when further questioned, it is on opposite side and more reproducible than her anginal pain), did not respond to nitro, and she has neg trop x 2 despite sx >12 hours - do not suspect ACS or unstable angina. D-Dimer neg, doubt PE, dissection. LFTs, lipase normal and she has no abd TTP to suggest cholecystitis or referred abx source. CXR is clear.  I suspect her sx are actually 2/2 cervical radiculopathy. She has known degen changes on MRI as far back as 2017 and sx reproduced with cervical exam here. Will treat with steroids, analgesia. Feels markedly improved already in ED. D/c with outpt follow-up.   ____________________________________________  FINAL CLINICAL IMPRESSION(S) / ED DIAGNOSES  Final diagnoses:  Cervical radiculopathy  Essential hypertension     MEDICATIONS GIVEN DURING THIS VISIT:  Medications  morphine 4 MG/ML injection 6 mg (6 mg Intravenous Given 12/03/19 1721)    ondansetron (ZOFRAN) injection 4 mg (4 mg Intravenous Given 12/03/19 1718)  ketorolac (TORADOL) 30 MG/ML injection 15 mg (15 mg Intravenous Given 12/03/19 1720)  oxyCODONE-acetaminophen (PERCOCET/ROXICET) 5-325 MG per tablet 2 tablet (2 tablets Oral Given 12/03/19 1843)  dexamethasone (DECADRON) injection 6 mg (6 mg Intravenous Given 12/03/19 1843)     ED Discharge Orders         Ordered    predniSONE (DELTASONE) 20 MG tablet  Daily     12/03/19 1847    oxyCODONE-acetaminophen (PERCOCET) 5-325 MG tablet  Every 6 hours PRN     12/03/19 1847           Note:  This document was prepared using Dragon voice recognition software and may include unintentional dictation errors.   Duffy Bruce, MD 12/03/19 [phone]

## 2019-12-03 NOTE — ED Triage Notes (Signed)
PT to ER with c/o right sided chest pain that radiates into right shoulder.  Pt states pain started about 3 days ago.  Pt states mild SHOB with intense pain only, no other s/s noted.  Pt states pain is similar to previous MIs.

## 2020-02-03 ENCOUNTER — Ambulatory Visit: Payer: Self-pay | Admitting: Surgery

## 2020-02-03 NOTE — H&P (View-Only) (Signed)
Subjective:   CC: Recurrent unilateral inguinal hernia with obstruction and without gangrene [K40.31]  HPI:  Sylvia Wiggins is a 59 y.o. female who returns for evaluation of above. First noted since the previous surgery. Burning pain around incision site.   Past Medical History:  has a past medical history of Acute myocardial infarction of anterolateral wall, initial episode of care (CMS-HCC), Anal fistula, Bell's palsy, CAD (coronary artery disease), Chronic back pain, Hemorrhoids, Hyperlipidemia, Hypertension, Kidney stone (2000), Kidney stone on left side (1997), Myocardial infarction (CMS-HCC) (03/2013), Osteoarthritis, Tethered spinal cord (CMS-HCC) (03/23/2017), and Tremor.  Past Surgical History:  has a past surgical history that includes Cesarean section (1998); Lithotripsy (1997, 2000); PCI/STENTING (2014); EXTRACTION OF WISDOM TOOTH (1980s); hemorrhoidectomy external (2006); anorectal exam (N/A, 11/16/2014); fistulotomy anal (N/A, 11/16/2014); placement seton (N/A, 11/16/2014); anorectal exam (N/A, 05/31/2015); fistulotomy anal (N/A, 05/31/2015); placement seton (N/A, 05/31/2015); anorectal exam (N/A, 11/22/2015); Coronary angioplasty with stent; Cardiac surgery (2014); laminectomy lumbar w/release tethered cord (N/A, 04/02/2017); and microsurgery (N/A, 04/02/2017).  Family History: family history includes Alcohol abuse in her sister; Cancer in her mother; Colon cancer in her mother; Coronary Artery Disease (Blocked arteries around heart) in her father, maternal grandfather, mother, and sister; Myocardial Infarction (Heart attack) in her father and sister; Stroke in her sister.  Social History:  reports that she has been smoking. She has a 2.50 pack-year smoking history. She has never used smokeless tobacco. She reports that she does not drink alcohol and does not use drugs.  Current Medications: has a current medication list which includes the following prescription(s): cholecalciferol and  nitroglycerin.  Allergies:  Allergies  Allergen Reactions  . Penicillins Nausea  . Septra [Sulfamethoxazole-Trimethoprim] Nausea    ROS:  General: Denies weight loss, weight gain, fatigue, fevers, chills, and night sweats. Heart: Denies chest pain, palpitations, racing heart, irregular heartbeat, leg pain or swelling, and decreased activity tolerance. Respiratory: Denies breathing difficulty, shortness of breath, wheezing, cough, and sputum. GI: Denies change in appetite, heartburn, nausea, vomiting, constipation, diarrhea, and blood in stool. GU: Denies difficulty urinating, pain with urinating, urgency, frequency, blood in urine  Objective:     BP 148/81   Pulse 62   Ht 157.5 cm (5\' 2" )   Wt 63.2 kg (139 lb 6.4 oz)   BMI 25.50 kg/m   Constitutional :  alert, appears stated age, cooperative and no distress  Lymphatics/Throat:  no asymmetry, masses, or scars  Respiratory:  clear to auscultation bilaterally  Cardiovascular:  regular rate and rhythm  Gastrointestinal: soft, non-tender; bowel sounds normal; no masses,  no organomegaly and right inguinal region with easily palpable bulge, no siginificant change in size or chararcteristic when supine or standing.  non-tender, no overlying skin changes.    Musculoskeletal: Steady gait and movement  Skin: Cool and moist, incisional scars healing well  Psychiatric: Normal affect, non-agitated, not confused       LABS:  n/a   RADS: CLINICAL DATA: Right inguinal pain and swelling. History of hernia  repair in August 2020.   EXAM:  CT PELVIS WITH CONTRAST   TECHNIQUE:  Multidetector CT imaging of the pelvis was performed using the  standard protocol following the bolus administration of intravenous  contrast.   CONTRAST: 167mL OMNIPAQUE IOHEXOL 300 MG/ML SOLN   COMPARISON: CT scan 04/17/2019   FINDINGS:  Urinary Tract: The bladder is unremarkable. No bladder calculi or  mass.   Bowel: The visualized small bowel and  colon are unremarkable. The  terminal ileum and appendix  are normal. Scattered colonic  diverticulosis but no findings for acute diverticulitis.   Vascular/Lymphatic: Moderate age advanced atherosclerotic  calcifications involving the aorta and iliac arteries but no  aneurysm or dissection. No lower retroperitoneal or pelvic  lymphadenopathy.   Reproductive: The uterus and ovaries are unremarkable.   Other: There is a large right inguinal hernia. This contains fat and  probable complex fluid/postoperative hematoma inferiorly. I do not  see any obvious mesh. This looks very similar to the preoperative CT  scan. There is also a small persistent left inguinal hernia. I do  not see any findings for incarceration. No bowel involvement.   Musculoskeletal: No significant bony findings. Stable advanced  degenerative disc disease at L4-5.   IMPRESSION:  Recurrent large right inguinal hernia with a small amount of  probable partially liquified postoperative hematoma within it.   Small left inguinal hernia.   No significant intrapelvic abnormalities.    Electronically Signed  By: Rudie Meyer M.D.  On: 08/14/2019 08:38   Assessment:      Recurrent unilateral inguinal hernia with obstruction and without gangrene [K40.31] RIGHT   Plan:     1. Recurrent unilateral inguinal hernia with obstruction and without gangrene [K40.31]RIGHT, will proceed with open repair.  Discussed the risk of surgery including recurrence, which can be up to 50% in the case of incisional or complex hernias, possible use of prosthetic materials (mesh) and the increased risk of mesh infxn if used, bleeding, chronic pain, post-op infxn, post-op SBO or ileus, and possible re-operation to address said risks. The risks of general anesthetic, if used, includes MI, CVA, sudden death or even reaction to anesthetic medications also discussed. Alternatives include continued observation.  Benefits include possible  symptom relief, prevention of incarceration, strangulation, enlargement in size over time, and the risk of emergency surgery in the face of strangulation.   Typical post-op recovery time of 3-5 days with 2 weeks of activity restrictions were also discussed.  ED return precautions given for sudden increase in pain, size of hernia with accompanying fever, nausea, and/or vomiting.  The patient verbalized understanding and all questions were answered to the patient's satisfaction.

## 2020-02-03 NOTE — H&P (Signed)
Subjective:   CC: Recurrent unilateral inguinal hernia with obstruction and without gangrene [K40.31]  HPI:  Sylvia Wiggins is a 59 y.o. female who returns for evaluation of above. First noted since the previous surgery. Burning pain around incision site.   Past Medical History:  has a past medical history of Acute myocardial infarction of anterolateral wall, initial episode of care (CMS-HCC), Anal fistula, Bell's palsy, CAD (coronary artery disease), Chronic back pain, Hemorrhoids, Hyperlipidemia, Hypertension, Kidney stone (2000), Kidney stone on left side (1997), Myocardial infarction (CMS-HCC) (03/2013), Osteoarthritis, Tethered spinal cord (CMS-HCC) (03/23/2017), and Tremor.  Past Surgical History:  has a past surgical history that includes Cesarean section (1998); Lithotripsy (1997, 2000); PCI/STENTING (2014); EXTRACTION OF WISDOM TOOTH (1980s); hemorrhoidectomy external (2006); anorectal exam (N/A, 11/16/2014); fistulotomy anal (N/A, 11/16/2014); placement seton (N/A, 11/16/2014); anorectal exam (N/A, 05/31/2015); fistulotomy anal (N/A, 05/31/2015); placement seton (N/A, 05/31/2015); anorectal exam (N/A, 11/22/2015); Coronary angioplasty with stent; Cardiac surgery (2014); laminectomy lumbar w/release tethered cord (N/A, 04/02/2017); and microsurgery (N/A, 04/02/2017).  Family History: family history includes Alcohol abuse in her sister; Cancer in her mother; Colon cancer in her mother; Coronary Artery Disease (Blocked arteries around heart) in her father, maternal grandfather, mother, and sister; Myocardial Infarction (Heart attack) in her father and sister; Stroke in her sister.  Social History:  reports that she has been smoking. She has a 2.50 pack-year smoking history. She has never used smokeless tobacco. She reports that she does not drink alcohol and does not use drugs.  Current Medications: has a current medication list which includes the following prescription(s): cholecalciferol and  nitroglycerin.  Allergies:  Allergies  Allergen Reactions  . Penicillins Nausea  . Septra [Sulfamethoxazole-Trimethoprim] Nausea    ROS:  General: Denies weight loss, weight gain, fatigue, fevers, chills, and night sweats. Heart: Denies chest pain, palpitations, racing heart, irregular heartbeat, leg pain or swelling, and decreased activity tolerance. Respiratory: Denies breathing difficulty, shortness of breath, wheezing, cough, and sputum. GI: Denies change in appetite, heartburn, nausea, vomiting, constipation, diarrhea, and blood in stool. GU: Denies difficulty urinating, pain with urinating, urgency, frequency, blood in urine  Objective:     BP 148/81   Pulse 62   Ht 157.5 cm (5' 2")   Wt 63.2 kg (139 lb 6.4 oz)   BMI 25.50 kg/m   Constitutional :  alert, appears stated age, cooperative and no distress  Lymphatics/Throat:  no asymmetry, masses, or scars  Respiratory:  clear to auscultation bilaterally  Cardiovascular:  regular rate and rhythm  Gastrointestinal: soft, non-tender; bowel sounds normal; no masses,  no organomegaly and right inguinal region with easily palpable bulge, no siginificant change in size or chararcteristic when supine or standing.  non-tender, no overlying skin changes.    Musculoskeletal: Steady gait and movement  Skin: Cool and moist, incisional scars healing well  Psychiatric: Normal affect, non-agitated, not confused       LABS:  n/a   RADS: CLINICAL DATA: Right inguinal pain and swelling. History of hernia  repair in August 2020.   EXAM:  CT PELVIS WITH CONTRAST   TECHNIQUE:  Multidetector CT imaging of the pelvis was performed using the  standard protocol following the bolus administration of intravenous  contrast.   CONTRAST: 100mL OMNIPAQUE IOHEXOL 300 MG/ML SOLN   COMPARISON: CT scan 04/17/2019   FINDINGS:  Urinary Tract: The bladder is unremarkable. No bladder calculi or  mass.   Bowel: The visualized small bowel and  colon are unremarkable. The  terminal ileum and appendix   are normal. Scattered colonic  diverticulosis but no findings for acute diverticulitis.   Vascular/Lymphatic: Moderate age advanced atherosclerotic  calcifications involving the aorta and iliac arteries but no  aneurysm or dissection. No lower retroperitoneal or pelvic  lymphadenopathy.   Reproductive: The uterus and ovaries are unremarkable.   Other: There is a large right inguinal hernia. This contains fat and  probable complex fluid/postoperative hematoma inferiorly. I do not  see any obvious mesh. This looks very similar to the preoperative CT  scan. There is also a small persistent left inguinal hernia. I do  not see any findings for incarceration. No bowel involvement.   Musculoskeletal: No significant bony findings. Stable advanced  degenerative disc disease at L4-5.   IMPRESSION:  Recurrent large right inguinal hernia with a small amount of  probable partially liquified postoperative hematoma within it.   Small left inguinal hernia.   No significant intrapelvic abnormalities.    Electronically Signed  By: Rudie Meyer M.D.  On: 08/14/2019 08:38   Assessment:      Recurrent unilateral inguinal hernia with obstruction and without gangrene [K40.31] RIGHT   Plan:     1. Recurrent unilateral inguinal hernia with obstruction and without gangrene [K40.31]RIGHT, will proceed with open repair.  Discussed the risk of surgery including recurrence, which can be up to 50% in the case of incisional or complex hernias, possible use of prosthetic materials (mesh) and the increased risk of mesh infxn if used, bleeding, chronic pain, post-op infxn, post-op SBO or ileus, and possible re-operation to address said risks. The risks of general anesthetic, if used, includes MI, CVA, sudden death or even reaction to anesthetic medications also discussed. Alternatives include continued observation.  Benefits include possible  symptom relief, prevention of incarceration, strangulation, enlargement in size over time, and the risk of emergency surgery in the face of strangulation.   Typical post-op recovery time of 3-5 days with 2 weeks of activity restrictions were also discussed.  ED return precautions given for sudden increase in pain, size of hernia with accompanying fever, nausea, and/or vomiting.  The patient verbalized understanding and all questions were answered to the patient's satisfaction.

## 2020-02-17 ENCOUNTER — Other Ambulatory Visit: Payer: Self-pay

## 2020-02-17 ENCOUNTER — Encounter
Admission: RE | Admit: 2020-02-17 | Discharge: 2020-02-17 | Disposition: A | Discharge status: Home / Self Care | Payer: Medicare HMO | Source: Ambulatory Visit | Attending: Surgery | Admitting: Surgery

## 2020-02-17 DIAGNOSIS — Z01812 Encounter for preprocedural laboratory examination: Secondary | ICD-10-CM | POA: Insufficient documentation

## 2020-02-17 NOTE — Patient Instructions (Signed)
Your procedure is scheduled on: Thurs 5/20 Report to .[facility] To find out your arrival time please call [phone] between [place] on Wed 5/19 .  Remember: Instructions that are not followed completely may result in serious medical risk,  up to and including death, or upon the discretion of your surgeon and anesthesiologist your  surgery may need to be rescheduled.     _X__ 1. Do not eat food after midnight the night before your procedure.                 No gum chewing or hard candies. You may drink clear liquids up to 2 hours                 before you are scheduled to arrive for your surgery- DO not drink clear                 liquids within 2 hours of the start of your surgery.                 Clear Liquids include:  water, apple juice without pulp, clear Gatorade, G2 or                  Gatorade Zero (avoid Red/Purple/Blue), Black Coffee or Tea (Do not add                 anything to coffee or tea). _____2.   Complete the carbohydrate drink provided to you, 2 hours before arrival.  __X__2.  On the morning of surgery brush your teeth with toothpaste and water, you                may rinse your mouth with mouthwash if you wish.  Do not swallow any toothpaste of mouthwash.     _X__ 3.  No Alcohol for 24 hours before or after surgery.   _X__ 4.  Do Not Smoke or use e-cigarettes For 24 Hours Prior to Your Surgery.                 Do not use any chewable tobacco products for at least 6 hours prior to                 Surgery.  _X__  5.  Do not use any recreational drugs (marijuana, cocaine, heroin, ecstacy, MDMA or other)                For at least one week prior to your surgery.  Combination of these drugs with anesthesia                May have life threatening results.  ____  6.  Bring all medications with you on the day of surgery if instructed.   __x__  7.  Notify your doctor if there is any change in your medical condition   (cold, fever, infections).     Do not wear jewelry, make-up, hairpins, clips or nail polish. Do not wear lotions, powders, or perfumes. You may wear deodorant. Do not shave 48 hours prior to surgery. Men may shave face and neck. Do not bring valuables to the hospital.    [facility] is not responsible for any belongings or valuables.  Contacts, dentures or bridgework may not be worn into surgery. Leave your suitcase in the car. After surgery it may be brought to your room. For patients admitted to the hospital, discharge time is determined by your treatment team.  Patients discharged the day of surgery will not be allowed to drive home.   Make arrangements for someone to be with you for the first 24 hours of your Same Day Discharge.    Please read over the following fact sheets that you were given:    __x__ Take these medicines the morning of surgery with A SIP OF WATER:    1. none  2.   3.   4.  5.  6.  ____ Fleet Enema (as directed)   __x__ Use CHG Soap (or wipes) as directed  ____ Use Benzoyl Peroxide Gel as instructed  ____ Use inhalers on the day of surgery  ____ Stop metformin 2 days prior to surgery    ____ Take 1/2 of usual insulin dose the night before surgery. No insulin the morning          of surgery.   ____ Stop Coumadin/Plavix/aspirin on   _x___ Stop Anti-inflammatories no ibuprofen aleve or aspirin until after the surgery  May take tylenol   ____ Stop supplements until after surgery.    ____ Bring C-Pap to the hospital.

## 2020-02-24 ENCOUNTER — Other Ambulatory Visit
Admission: RE | Admit: 2020-02-24 | Discharge: 2020-02-24 | Disposition: A | Discharge status: Home / Self Care | Payer: Medicare HMO | Source: Ambulatory Visit | Attending: Surgery | Admitting: Surgery

## 2020-02-24 DIAGNOSIS — Z20822 Contact with and (suspected) exposure to covid-19: Secondary | ICD-10-CM | POA: Diagnosis not present

## 2020-02-24 DIAGNOSIS — Z01812 Encounter for preprocedural laboratory examination: Secondary | ICD-10-CM | POA: Diagnosis present

## 2020-02-24 LAB — SARS CORONAVIRUS 2 (TAT 6-24 HRS): SARS Coronavirus 2: NEGATIVE

## 2020-02-26 ENCOUNTER — Ambulatory Visit
Admission: RE | Admit: 2020-02-26 | Discharge: 2020-02-26 | Disposition: A | Discharge status: Home / Self Care | Payer: Medicare HMO | Attending: Surgery | Admitting: Surgery

## 2020-02-26 ENCOUNTER — Encounter
Admission: RE | Disposition: A | Discharge status: Home / Self Care | Payer: Self-pay | Source: Home / Self Care | Attending: Surgery

## 2020-02-26 ENCOUNTER — Ambulatory Visit: Payer: Medicare HMO

## 2020-02-26 ENCOUNTER — Other Ambulatory Visit: Payer: Self-pay

## 2020-02-26 ENCOUNTER — Encounter: Payer: Self-pay | Admitting: Surgery

## 2020-02-26 DIAGNOSIS — F17200 Nicotine dependence, unspecified, uncomplicated: Secondary | ICD-10-CM | POA: Diagnosis not present

## 2020-02-26 DIAGNOSIS — K4091 Unilateral inguinal hernia, without obstruction or gangrene, recurrent: Secondary | ICD-10-CM | POA: Diagnosis not present

## 2020-02-26 DIAGNOSIS — I252 Old myocardial infarction: Secondary | ICD-10-CM | POA: Insufficient documentation

## 2020-02-26 DIAGNOSIS — I2510 Atherosclerotic heart disease of native coronary artery without angina pectoris: Secondary | ICD-10-CM | POA: Insufficient documentation

## 2020-02-26 DIAGNOSIS — Z882 Allergy status to sulfonamides status: Secondary | ICD-10-CM | POA: Insufficient documentation

## 2020-02-26 DIAGNOSIS — Z880 Allergy status to penicillin: Secondary | ICD-10-CM | POA: Diagnosis not present

## 2020-02-26 DIAGNOSIS — I10 Essential (primary) hypertension: Secondary | ICD-10-CM | POA: Insufficient documentation

## 2020-02-26 DIAGNOSIS — Z955 Presence of coronary angioplasty implant and graft: Secondary | ICD-10-CM | POA: Insufficient documentation

## 2020-02-26 HISTORY — PX: INGUINAL HERNIA REPAIR: SHX194

## 2020-02-26 SURGERY — REPAIR, HERNIA, INGUINAL, ADULT
Anesthesia: General | Site: Abdomen | Laterality: Right

## 2020-02-26 MED ORDER — OXYCODONE HCL 5 MG/5ML PO SOLN: 5.0000 mg | Freq: Once | ORAL | Status: AC | PRN

## 2020-02-26 MED ORDER — DEXAMETHASONE SODIUM PHOSPHATE 10 MG/ML IJ SOLN
INTRAMUSCULAR | Status: DC | PRN
  Administered 2020-02-26: 10 mg via INTRAVENOUS

## 2020-02-26 MED ORDER — MIDAZOLAM HCL 2 MG/2ML IJ SOLN
INTRAMUSCULAR | Status: DC | PRN
  Administered 2020-02-26: 2 mg via INTRAVENOUS

## 2020-02-26 MED ORDER — FENTANYL CITRATE (PF) 100 MCG/2ML IJ SOLN
INTRAMUSCULAR | Status: AC
  Administered 2020-02-26: 25 ug via INTRAVENOUS
  Filled 2020-02-26: qty 2

## 2020-02-26 MED ORDER — GABAPENTIN 300 MG PO CAPS
ORAL_CAPSULE | ORAL | Status: AC
  Filled 2020-02-26: qty 1

## 2020-02-26 MED ORDER — FENTANYL CITRATE (PF) 100 MCG/2ML IJ SOLN
INTRAMUSCULAR | Status: DC | PRN
  Administered 2020-02-26 (×2): 50 ug via INTRAVENOUS

## 2020-02-26 MED ORDER — PROPOFOL 10 MG/ML IV BOLUS
INTRAVENOUS | Status: AC
  Filled 2020-02-26: qty 20

## 2020-02-26 MED ORDER — ROCURONIUM BROMIDE 10 MG/ML (PF) SYRINGE
PREFILLED_SYRINGE | INTRAVENOUS | Status: AC
  Filled 2020-02-26: qty 10

## 2020-02-26 MED ORDER — ONDANSETRON HCL 4 MG/2ML IJ SOLN
INTRAMUSCULAR | Status: DC | PRN
  Administered 2020-02-26: 4 mg via INTRAVENOUS

## 2020-02-26 MED ORDER — OXYCODONE HCL 5 MG PO TABS
ORAL_TABLET | ORAL | Status: AC
  Filled 2020-02-26: qty 1

## 2020-02-26 MED ORDER — MIDAZOLAM HCL 2 MG/2ML IJ SOLN
INTRAMUSCULAR | Status: AC
  Filled 2020-02-26: qty 2

## 2020-02-26 MED ORDER — BUPIVACAINE LIPOSOME 1.3 % IJ SUSP
INTRAMUSCULAR | Status: DC | PRN
  Administered 2020-02-26: 20 mL

## 2020-02-26 MED ORDER — BUPIVACAINE-EPINEPHRINE (PF) 0.5% -1:200000 IJ SOLN
INTRAMUSCULAR | Status: AC
  Filled 2020-02-26: qty 90

## 2020-02-26 MED ORDER — HYDROCODONE-ACETAMINOPHEN 5-325 MG PO TABS: 1.0000 | ORAL_TABLET | Freq: Four times a day (QID) | ORAL | 0 refills | Status: DC | PRN

## 2020-02-26 MED ORDER — FENTANYL CITRATE (PF) 100 MCG/2ML IJ SOLN
INTRAMUSCULAR | Status: AC
  Filled 2020-02-26: qty 2

## 2020-02-26 MED ORDER — EPHEDRINE 5 MG/ML INJ
INTRAVENOUS | Status: AC
  Filled 2020-02-26: qty 10

## 2020-02-26 MED ORDER — PHENYLEPHRINE HCL (PRESSORS) 10 MG/ML IV SOLN
INTRAVENOUS | Status: DC | PRN
  Administered 2020-02-26 (×2): 100 ug via INTRAVENOUS

## 2020-02-26 MED ORDER — LIDOCAINE HCL (CARDIAC) PF 100 MG/5ML IV SOSY
PREFILLED_SYRINGE | INTRAVENOUS | Status: DC | PRN
  Administered 2020-02-26: 50 mg via INTRAVENOUS

## 2020-02-26 MED ORDER — CELECOXIB 200 MG PO CAPS
ORAL_CAPSULE | ORAL | Status: AC
  Administered 2020-02-26: 200 mg via ORAL
  Filled 2020-02-26: qty 1

## 2020-02-26 MED ORDER — FAMOTIDINE 20 MG PO TABS: 20.0000 mg | ORAL_TABLET | Freq: Once | ORAL | Status: AC

## 2020-02-26 MED ORDER — EPHEDRINE SULFATE 50 MG/ML IJ SOLN
INTRAMUSCULAR | Status: DC | PRN
  Administered 2020-02-26 (×2): 2.5 mg via INTRAVENOUS
  Administered 2020-02-26 (×2): 5 mg via INTRAVENOUS

## 2020-02-26 MED ORDER — DOCUSATE SODIUM 100 MG PO CAPS: 100.0000 mg | ORAL_CAPSULE | Freq: Two times a day (BID) | ORAL | 0 refills | Status: AC | PRN

## 2020-02-26 MED ORDER — ROCURONIUM BROMIDE 100 MG/10ML IV SOLN
INTRAVENOUS | Status: DC | PRN
  Administered 2020-02-26: 40 mg via INTRAVENOUS

## 2020-02-26 MED ORDER — ACETAMINOPHEN 500 MG PO TABS
ORAL_TABLET | ORAL | Status: AC
  Administered 2020-02-26: 1000 mg via ORAL
  Filled 2020-02-26: qty 2

## 2020-02-26 MED ORDER — CELECOXIB 200 MG PO CAPS: 200.0000 mg | ORAL_CAPSULE | ORAL | Status: AC

## 2020-02-26 MED ORDER — FAMOTIDINE 20 MG PO TABS
ORAL_TABLET | ORAL | Status: AC
  Administered 2020-02-26: 20 mg via ORAL
  Filled 2020-02-26: qty 1

## 2020-02-26 MED ORDER — GABAPENTIN 300 MG PO CAPS
300.0000 mg | ORAL_CAPSULE | ORAL | Status: AC
  Administered 2020-02-26: 300 mg via ORAL

## 2020-02-26 MED ORDER — FENTANYL CITRATE (PF) 100 MCG/2ML IJ SOLN
25.0000 ug | INTRAMUSCULAR | Status: DC | PRN
  Administered 2020-02-26 (×2): 25 ug via INTRAVENOUS

## 2020-02-26 MED ORDER — CLINDAMYCIN PHOSPHATE 900 MG/50ML IV SOLN
900.0000 mg | INTRAVENOUS | Status: AC
  Administered 2020-02-26: 900 mg via INTRAVENOUS

## 2020-02-26 MED ORDER — CLINDAMYCIN PHOSPHATE 900 MG/50ML IV SOLN
INTRAVENOUS | Status: AC
  Filled 2020-02-26: qty 50

## 2020-02-26 MED ORDER — BUPIVACAINE LIPOSOME 1.3 % IJ SUSP
INTRAMUSCULAR | Status: AC
  Filled 2020-02-26: qty 20

## 2020-02-26 MED ORDER — BUPIVACAINE-EPINEPHRINE (PF) 0.5% -1:200000 IJ SOLN
INTRAMUSCULAR | Status: DC | PRN
  Administered 2020-02-26: 5 mL

## 2020-02-26 MED ORDER — PROPOFOL 10 MG/ML IV BOLUS
INTRAVENOUS | Status: DC | PRN
  Administered 2020-02-26: 150 mg via INTRAVENOUS
  Administered 2020-02-26: 30 mg via INTRAVENOUS

## 2020-02-26 MED ORDER — ONDANSETRON HCL 4 MG/2ML IJ SOLN
INTRAMUSCULAR | Status: AC
  Filled 2020-02-26: qty 2

## 2020-02-26 MED ORDER — IBUPROFEN 800 MG PO TABS
800.0000 mg | ORAL_TABLET | Freq: Three times a day (TID) | ORAL | 0 refills | Status: DC | PRN
Start: 2020-02-26 — End: 2020-08-21

## 2020-02-26 MED ORDER — ACETAMINOPHEN 500 MG PO TABS: 1000.0000 mg | ORAL_TABLET | ORAL | Status: AC

## 2020-02-26 MED ORDER — LACTATED RINGERS IV SOLN: INTRAVENOUS | Status: DC

## 2020-02-26 MED ORDER — CHLORHEXIDINE GLUCONATE CLOTH 2 % EX PADS
6.0000 | MEDICATED_PAD | Freq: Once | CUTANEOUS | Status: AC
  Administered 2020-02-26: 6 via TOPICAL

## 2020-02-26 MED ORDER — DEXAMETHASONE SODIUM PHOSPHATE 10 MG/ML IJ SOLN
INTRAMUSCULAR | Status: AC
  Filled 2020-02-26: qty 1

## 2020-02-26 MED ORDER — ACETAMINOPHEN 325 MG PO TABS: 650.0000 mg | ORAL_TABLET | Freq: Three times a day (TID) | ORAL | 0 refills | Status: AC | PRN

## 2020-02-26 MED ORDER — SUGAMMADEX SODIUM 200 MG/2ML IV SOLN
INTRAVENOUS | Status: DC | PRN
  Administered 2020-02-26: 200 mg via INTRAVENOUS

## 2020-02-26 MED ORDER — LIDOCAINE HCL (PF) 2 % IJ SOLN
INTRAMUSCULAR | Status: AC
  Filled 2020-02-26: qty 5

## 2020-02-26 MED ORDER — OXYCODONE HCL 5 MG PO TABS
5.0000 mg | ORAL_TABLET | Freq: Once | ORAL | Status: AC | PRN
  Administered 2020-02-26: 5 mg via ORAL

## 2020-02-26 SURGICAL SUPPLY — 39 items
BLADE CLIPPER SURG (BLADE) ×3 IMPLANT
BLADE SURG 15 STRL LF DISP TIS (BLADE) IMPLANT
BLADE SURG 15 STRL SS (BLADE)
CANISTER SUCT 1200ML W/VALVE (MISCELLANEOUS) ×3 IMPLANT
CHLORAPREP W/TINT 26 (MISCELLANEOUS) ×3 IMPLANT
COVER WAND RF STERILE (DRAPES) ×3 IMPLANT
DERMABOND ADVANCED (GAUZE/BANDAGES/DRESSINGS) ×2
DERMABOND ADVANCED .7 DNX12 (GAUZE/BANDAGES/DRESSINGS) ×1 IMPLANT
DRAIN PENROSE 1/4X12 LTX STRL (WOUND CARE) ×3 IMPLANT
DRAPE LAPAROTOMY 100X77 ABD (DRAPES) ×3 IMPLANT
ELECT REM PT RETURN 9FT ADLT (ELECTROSURGICAL) ×3
ELECTRODE REM PT RTRN 9FT ADLT (ELECTROSURGICAL) ×1 IMPLANT
GLOVE BIOGEL PI IND STRL 7.0 (GLOVE) ×3 IMPLANT
GLOVE BIOGEL PI INDICATOR 7.0 (GLOVE) ×6
GLOVE SURG SYN 6.5 ES PF (GLOVE) ×9 IMPLANT
GOWN STRL REUS W/ TWL LRG LVL3 (GOWN DISPOSABLE) ×3 IMPLANT
GOWN STRL REUS W/TWL LRG LVL3 (GOWN DISPOSABLE) ×9
LABEL OR SOLS (LABEL) ×3 IMPLANT
MESH PARIETEX PROGRIP RIGHT (Mesh General) ×3 IMPLANT
NEEDLE HYPO 22GX1.5 SAFETY (NEEDLE) ×6 IMPLANT
NS IRRIG 500ML POUR BTL (IV SOLUTION) ×3 IMPLANT
PACK BASIN MINOR (MISCELLANEOUS) ×3 IMPLANT
SPONGE KITTNER 5P (MISCELLANEOUS) ×3 IMPLANT
SUT ETHIBOND NAB MO 7 #0 18IN (SUTURE) ×3 IMPLANT
SUT MNCRL 4-0 (SUTURE) ×3
SUT MNCRL 4-0 27XMFL (SUTURE) ×1
SUT SILK 3 0 12 30 (SUTURE) IMPLANT
SUT SILK 3 0 SH 30 (SUTURE) IMPLANT
SUT VIC AB 2-0 CT2 27 (SUTURE) ×3 IMPLANT
SUT VIC AB 2-0 SH 27 (SUTURE) ×3
SUT VIC AB 2-0 SH 27XBRD (SUTURE) ×1 IMPLANT
SUT VIC AB 3-0 SH 27 (SUTURE) ×3
SUT VIC AB 3-0 SH 27X BRD (SUTURE) ×1 IMPLANT
SUTURE MNCRL 4-0 27XMF (SUTURE) ×1 IMPLANT
SYR 10ML LL (SYRINGE) ×3 IMPLANT
SYR 20ML LL LF (SYRINGE) ×3 IMPLANT
SYR BULB IRRIG 60ML STRL (SYRINGE) ×3 IMPLANT
TOWEL OR 17X26 4PK STRL BLUE (TOWEL DISPOSABLE) ×3 IMPLANT
WATER STERILE IRR 1000ML POUR (IV SOLUTION) ×3 IMPLANT

## 2020-02-26 NOTE — Transfer of Care (Signed)
Immediate Anesthesia Transfer of Care Note  Patient: Sylvia Wiggins  Procedure(s) Performed: HERNIA REPAIR INGUINAL ADULT (Right Abdomen)  Patient Location: PACU  Anesthesia Type:General  Level of Consciousness: drowsy  Airway & Oxygen Therapy: Patient Spontanous Breathing and Patient connected to face mask oxygen  Post-op Assessment: Report given to RN and Post -op Vital signs reviewed and stable  Post vital signs: Reviewed and stable  Last Vitals:  Vitals Value Taken Time  BP 152/88 02/26/20 1115  Temp 36.7 C 02/26/20 1115  Pulse 83 02/26/20 1117  Resp 19 02/26/20 1117  SpO2 100 % 02/26/20 1117  Vitals shown include unvalidated device data.  Last Pain:  Vitals:   02/26/20 1115  TempSrc:   PainSc: Asleep         Complications: No apparent anesthesia complications

## 2020-02-26 NOTE — Interval H&P Note (Signed)
History and Physical Interval Note:  02/26/2020 8:56 AM  Sylvia Wiggins  has presented today for surgery, with the diagnosis of K40.31 Recurrent unilateral inguinal hernia with obstruction and without gangrene.  The various methods of treatment have been discussed with the patient and family. After consideration of risks, benefits and other options for treatment, the patient has consented to  Procedure(s): HERNIA REPAIR INGUINAL ADULT (Right) as a surgical intervention.  The patient's history has been reviewed, patient examined, no change in status, stable for surgery.  I have reviewed the patient's chart and labs.  Questions were answered to the patient's satisfaction.     Isami Tonna Boehringer

## 2020-02-26 NOTE — Op Note (Signed)
Preoperative diagnosis: right recurrent Inguinal Hernia.  Postoperative diagnosis: right recurrent indirect  Inguinal Hernia  Procedure:  Open right Inguinal hernia repair with mesh  Anesthesia: General, LMA  Surgeon: Dr. Tonna Boehringer  Wound Classification: Clean  Specimen: none  Complications: None  Estimated Blood Loss: 107mL   Indications:  Patient is a 59 y.o. female developed a symptomatic right  inguinal hernia. Repair was indicated to avoid complications of incarceration, obstruction and pain, and a prosthetic mesh repair was elected.  Findings: 1. Vas Deferens and cord structures identified and preserved 2. Progrip mesh used for repair 3. Adequate hemostasis achieved  Description of procedure: The patient was taken to the operating room. A time-out was completed verifying correct patient, procedure, site, positioning, and implant(s) and/or special equipment prior to beginning this procedure. The right  groin was prepped and draped in the usual sterile fashion. An incision was marked in a natural skin crease and planned to end near the pubic tubercle.  The skin crease incision was made with a knife and deepened through a very thick Scarpa's and Camper's fascia with electrocautery until the aponeurosis of the external oblique was encountered. This was cleaned and the external ring was exposed. Hemostasis was achieved in the wound. An incision was made in the midportion of the external oblique aponeurosis in the direction of its fibers. Flaps of the external oblique were developed cephalad and inferiorly.  An indirect hernia sac was identified. The sac was carefully dissected free of the surrounding tissue down to the level of the internal ring and reduced back into abdominal cavity along with an adjacent cord lipoma.  Attention then turned to the floor of the canal, which was attenuated and stretched out due to large hernia.  The defect was approximated using interrupted 0 ethibond.  Afterwards, The Progrip mesh was inserted and secured to the pubic tubercle using interrupted 0 ethibond sutures. Care was taken to assure that the mesh was placed flat against the reinforced floor.  Hemostasis was again checked. The Penrose drain was removed. Exparel infused as an ilioinguinal block.  The external oblique aponeurosis was closed with a running suture of 2-0 Vicryl. Scarpa's fascia was closed with interrupted 3-0 Vicryl.  The deep dermal layer closed with interrupted 3-0 Vicryl.  The skin was closed with a subcuticular stitch of Monocryl 4-0. Dermabond was applied.  The patient tolerated the procedure well and was taken to the postanesthesia care unit in stable condition. Sponge and instrument count correct at end of procedure.

## 2020-02-26 NOTE — Anesthesia Procedure Notes (Signed)
Procedure Name: Intubation Date/Time: 02/26/2020 9:28 AM Performed by: Hedda Slade, CRNA Pre-anesthesia Checklist: Patient identified, Patient being monitored, Timeout performed, Emergency Drugs available and Suction available Patient Re-evaluated:Patient Re-evaluated prior to induction Oxygen Delivery Method: Circle system utilized Preoxygenation: Pre-oxygenation with 100% oxygen Induction Type: IV induction Ventilation: Mask ventilation without difficulty Laryngoscope size: McGrath MAC 3. Grade View: Grade I Tube type: Oral Tube size: 7.0 mm Number of attempts: 1 Airway Equipment and Method: Stylet Placement Confirmation: ETT inserted through vocal cords under direct vision,  positive ETCO2 and breath sounds checked- equal and bilateral Secured at: 20 cm Tube secured with: Tape Dental Injury: Teeth and Oropharynx as per pre-operative assessment  Comments: Pt with poor dentition pre-op, dentition remains the same as pre-op. Performed by Reece Agar, SRNA under the supervision of CRNA and MDA

## 2020-02-26 NOTE — Anesthesia Preprocedure Evaluation (Signed)
Anesthesia Evaluation  Patient identified by MRN, date of birth, ID band Patient awake    Reviewed: Allergy & Precautions, H&P , NPO status , Patient's Chart, lab work & pertinent test results  History of Anesthesia Complications Negative for: history of anesthetic complications  Airway Mallampati: III  TM Distance: >3 FB Neck ROM: full    Dental  (+) Chipped, Poor Dentition, Missing   Pulmonary neg shortness of breath, COPD, Current Smoker,    Pulmonary exam normal        Cardiovascular Exercise Tolerance: Good hypertension, (-) angina+ CAD, + Past MI and + Cardiac Stents  (-) DOE Normal cardiovascular exam     Neuro/Psych PSYCHIATRIC DISORDERS  Neuromuscular disease    GI/Hepatic negative GI ROS, Neg liver ROS, neg GERD  ,  Endo/Other  negative endocrine ROS  Renal/GU      Musculoskeletal  (+) Arthritis ,   Abdominal   Peds  Hematology negative hematology ROS (+)   Anesthesia Other Findings Past Medical History: No date: Abscess, gluteal, left     Comment:  Recurrent drainage, followed by [facility] No date: Arthritis No date: CAD (coronary artery disease)     Comment:  Stents in Clogged artery at [facility] June 2014 No date: Depression No date: Elevated lipids No date: Hyperlipidemia No date: Hypertension June 2014: Myocardial infarction [facility])  Past Surgical History: 08/30/2016: CARDIAC CATHETERIZATION; N/A     Comment:  Procedure: Left Heart Cath and Coronary Angiography;                Surgeon: Marcina Millard, MD;  Location: [facility]               INVASIVE CV LAB;  Service: Cardiovascular;  Laterality:               N/A; 03/30/2013: CARDIAC SURGERY     Comment:  stent placement No date: CORONARY ANGIOPLASTY     Comment:  cardiac stents x 2 02/18/2018: LEFT HEART CATH AND CORS/GRAFTS ANGIOGRAPHY; N/A     Comment:  Procedure: LEFT HEART CATH AND CORS/GRAFTS ANGIOGRAPHY;               Surgeon: Dalia Heading, MD;  Location: [facility] INVASIVE CV              LAB;  Service: Cardiovascular;  Laterality: N/A; Anal Fissure: RECTAL SURGERY 05/16/2019: XI ROBOTIC ASSISTED INGUINAL HERNIA REPAIR WITH MESH; Right     Comment:  Procedure: XI ROBOTIC ASSISTED Right INGUINAL HERNIA               REPAIR WITH MESH;  Surgeon: Sung Amabile, DO;  Location:               [facility] ORS;  Service: General;  Laterality: Right;  BMI    Body Mass Index: 25.40 kg/m      Reproductive/Obstetrics negative OB ROS                             Anesthesia Physical Anesthesia Plan  ASA: III  Anesthesia Plan: General ETT   Post-op Pain Management:    Induction: Intravenous  PONV Risk Score and Plan: Ondansetron, Dexamethasone, Midazolam and Treatment may vary due to age or medical condition  Airway Management Planned: Oral ETT  Additional Equipment:   Intra-op Plan:   Post-operative Plan: Extubation in OR  Informed Consent: I have reviewed the patients History and Physical, chart, labs and discussed the procedure including  the risks, benefits and alternatives for the proposed anesthesia with the patient or authorized representative who has indicated his/her understanding and acceptance.     Dental Advisory Given  Plan Discussed with: Anesthesiologist, CRNA and Surgeon  Anesthesia Plan Comments: (Patient consented for risks of anesthesia including but not limited to:  - adverse reactions to medications - damage to eyes, teeth, lips or other oral mucosa - nerve damage due to positioning  - sore throat or hoarseness - Damage to heart, brain, nerves, lungs or loss of life  Patient voiced understanding.)        Anesthesia Quick Evaluation

## 2020-02-26 NOTE — Discharge Instructions (Signed)
Hernia repair, Care After This sheet gives you information about how to care for yourself after your procedure. Your health care provider may also give you more specific instructions. If you have problems or questions, contact your health care provider. What can I expect after the procedure? After your procedure, it is common to have the following:  Pain in your abdomen, especially in the incision areas. You will be given medicine to control the pain.  Tiredness. This is a normal part of the recovery process. Your energy level will return to normal over the next several weeks.  Changes in your bowel movements, such as constipation or needing to go more often. Talk with your health care provider about how to manage this. Follow these instructions at home: Medicines   tylenol and advil as needed for discomfort.  Please alternate between the two every four hours as needed for pain.     Use narcotics, if prescribed, only when tylenol and motrin is not enough to control pain.   325-650mg every 8hrs to max of 3000mg/24hrs (including the 325mg in every norco dose) for the tylenol.     Advil up to 800mg per dose every 8hrs as needed for pain.    PLEASE RECORD NUMBER OF PILLS TAKEN UNTIL NEXT FOLLOW UP APPT.  THIS WILL HELP DETERMINE HOW READY YOU ARE TO BE RELEASED FROM ANY ACTIVITY RESTRICTIONS  Do not drive or use heavy machinery while taking prescription pain medicine.  Do not drink alcohol while taking prescription pain medicine.  Incision care     Follow instructions from your health care provider about how to take care of your incision areas. Make sure you: ? Keep your incisions clean and dry. ? Wash your hands with soap and water before and after applying medicine to the areas, and before and after changing your bandage (dressing). If soap and water are not available, use hand sanitizer. ? Change your dressing as told by your health care provider. ? Leave stitches (sutures), skin  glue, or adhesive strips in place. These skin closures may need to stay in place for 2 weeks or longer. If adhesive strip edges start to loosen and curl up, you may trim the loose edges. Do not remove adhesive strips completely unless your health care provider tells you to do that.  Do not wear tight clothing over the incisions. Tight clothing may rub and irritate the incision areas, which may cause the incisions to open.  Do not take baths, swim, or use a hot tub until your health care provider approves. OK TO SHOWER IN 24HRS.    Check your incision area every day for signs of infection. Check for: ? More redness, swelling, or pain. ? More fluid or blood. ? Warmth. ? Pus or a bad smell. Activity  Avoid lifting anything that is heavier than 10 lb (4.5 kg) for 2 weeks or until your health care provider says it is okay.  No pushing/pulling greater than 30lbs  You may resume normal activities as told by your health care provider. Ask your health care provider what activities are safe for you.  Take rest breaks during the day as needed. Eating and drinking  Follow instructions from your health care provider about what you can eat after surgery.  To prevent or treat constipation while you are taking prescription pain medicine, your health care provider may recommend that you: ? Drink enough fluid to keep your urine clear or pale yellow. ? Take over-the-counter or prescription medicines. ?   Eat foods that are high in fiber, such as fresh fruits and vegetables, whole grains, and beans. ? Limit foods that are high in fat and processed sugars, such as fried and sweet foods. General instructions  Ask your health care provider when you will need an appointment to get your sutures or staples removed.  Keep all follow-up visits as told by your health care provider. This is important. Contact a health care provider if:  You have more redness, swelling, or pain around your incisions.  You have  more fluid or blood coming from the incisions.  Your incisions feel warm to the touch.  You have pus or a bad smell coming from your incisions or your dressing.  You have a fever.  You have an incision that breaks open (edges not staying together) after sutures or staples have been removed. Get help right away if:  You develop a rash.  You have chest pain or difficulty breathing.  You have pain or swelling in your legs.  You feel light-headed or you faint.  Your abdomen swells (becomes distended).  You have nausea or vomiting.  You have blood in your stool (feces). This information is not intended to replace advice given to you by your health care provider. Make sure you discuss any questions you have with your health care provider. Document Released: 04/14/2005 Document Revised: 06/14/2018 Document Reviewed: 06/26/2016 Elsevier Interactive Patient Education  2019 Elsevier Inc.   Bupivacaine Liposomal Suspension for Injection What is this medicine? BUPIVACAINE LIPOSOMAL (bue PIV a kane LIP oh som al) is an anesthetic. It causes loss of feeling in the skin or other tissues. It is used to prevent and to treat pain from some procedures. This medicine may be used for other purposes; ask your health care provider or pharmacist if you have questions. COMMON BRAND NAME(S): EXPAREL What should I tell my health care provider before I take this medicine? They need to know if you have any of these conditions:  G6PD deficiency  heart disease  kidney disease  liver disease  low blood pressure  lung or breathing disease, like asthma  an unusual or allergic reaction to bupivacaine, other medicines, foods, dyes, or preservatives  pregnant or trying to get pregnant  breast-feeding How should I use this medicine? This medicine is for injection into the affected area. It is given by a health care professional in a hospital or clinic setting. Talk to your pediatrician regarding  the use of this medicine in children. Special care may be needed. Overdosage: If you think you have taken too much of this medicine contact a poison control center or emergency room at once. NOTE: This medicine is only for you. Do not share this medicine with others. What if I miss a dose? This does not apply. What may interact with this medicine? This medicine may interact with the following medications:  acetaminophen  certain antibiotics like dapsone, nitrofurantoin, aminosalicylic acid, sulfonamides  certain medicines for seizures like phenobarbital, phenytoin, valproic acid  chloroquine  cyclophosphamide  flutamide  hydroxyurea  ifosfamide  metoclopramide  nitric oxide  nitroglycerin  nitroprusside  nitrous oxide  other local anesthetics like lidocaine, pramoxine, tetracaine  primaquine  quinine  rasburicase  sulfasalazine This list may not describe all possible interactions. Give your health care provider a list of all the medicines, herbs, non-prescription drugs, or dietary supplements you use. Also tell them if you smoke, drink alcohol, or use illegal drugs. Some items may interact with your medicine. What should   I watch for while using this medicine? Your condition will be monitored carefully while you are receiving this medicine. Be careful to avoid injury while the area is numb, and you are not aware of pain. What side effects may I notice from receiving this medicine? Side effects that you should report to your doctor or health care professional as soon as possible:  allergic reactions like skin rash, itching or hives, swelling of the face, lips, or tongue  seizures  signs and symptoms of a dangerous change in heartbeat or heart rhythm like chest pain; dizziness; fast, irregular heartbeat; palpitations; feeling faint or lightheaded; falls; breathing problems  signs and symptoms of methemoglobinemia such as pale, gray, or blue colored skin; headache;  fast heartbeat; shortness of breath; feeling faint or lightheaded, falls; tiredness Side effects that usually do not require medical attention (report to your doctor or health care professional if they continue or are bothersome):  anxious  back pain  changes in taste  changes in vision  constipation  dizziness  fever  nausea, vomiting This list may not describe all possible side effects. Call your doctor for medical advice about side effects. You may report side effects to FDA at 1-800-FDA-1088. Where should I keep my medicine? This drug is given in a hospital or clinic and will not be stored at home. NOTE: This sheet is a summary. It may not cover all possible information. If you have questions about this medicine, talk to your doctor, pharmacist, or health care provider.  2020 Elsevier/Gold Standard (2019-07-08 10:48:23)   AMBULATORY SURGERY  DISCHARGE INSTRUCTIONS   1) The drugs that you were given will stay in your system until tomorrow so for the next 24 hours you should not:  A) Drive an automobile B) Make any legal decisions C) Drink any alcoholic beverage   2) You may resume regular meals tomorrow.  Today it is better to start with liquids and gradually work up to solid foods.  You may eat anything you prefer, but it is better to start with liquids, then soup and crackers, and gradually work up to solid foods.   3) Please notify your doctor immediately if you have any unusual bleeding, trouble breathing, redness and pain at the surgery site, drainage, fever, or pain not relieved by medication.    4) Additional Instructions:        Please contact your physician with any problems or Same Day Surgery at [phone], Monday through Friday 6 am to 4 pm, or [place] [facility] [place] Main number at [phone]. 

## 2020-02-27 NOTE — Anesthesia Postprocedure Evaluation (Signed)
Anesthesia Post Note  Patient: Sylvia Wiggins  Procedure(s) Performed: HERNIA REPAIR INGUINAL ADULT (Right Abdomen)  Patient location during evaluation: PACU Anesthesia Type: General Level of consciousness: awake and alert Pain management: pain level controlled Vital Signs Assessment: post-procedure vital signs reviewed and stable Respiratory status: spontaneous breathing, nonlabored ventilation, respiratory function stable and patient connected to nasal cannula oxygen Cardiovascular status: blood pressure returned to baseline and stable Postop Assessment: no apparent nausea or vomiting Anesthetic complications: no     Last Vitals:  Vitals:   02/26/20 1213 02/26/20 1308  BP: (!) 143/62 (!) 145/67  Pulse: 62 (!) 59  Resp: 18 16  Temp: 36.6 C   SpO2: 100% 100%    Last Pain:  Vitals:   02/26/20 1308  TempSrc:   PainSc: 4                  Cleda Mccreedy Cade Dashner

## 2020-08-21 ENCOUNTER — Inpatient Hospital Stay
Admission: EM | Admit: 2020-08-21 | Discharge: 2020-08-24 | DRG: 247 | Disposition: A | Discharge status: Home / Self Care | Payer: Medicare HMO | Attending: Internal Medicine | Admitting: Internal Medicine

## 2020-08-21 ENCOUNTER — Emergency Department: Payer: Medicare HMO

## 2020-08-21 ENCOUNTER — Other Ambulatory Visit: Payer: Self-pay

## 2020-08-21 DIAGNOSIS — I25110 Atherosclerotic heart disease of native coronary artery with unstable angina pectoris: Principal | ICD-10-CM | POA: Diagnosis present

## 2020-08-21 DIAGNOSIS — I200 Unstable angina: Secondary | ICD-10-CM | POA: Diagnosis present

## 2020-08-21 DIAGNOSIS — E785 Hyperlipidemia, unspecified: Secondary | ICD-10-CM | POA: Diagnosis present

## 2020-08-21 DIAGNOSIS — F17210 Nicotine dependence, cigarettes, uncomplicated: Secondary | ICD-10-CM | POA: Diagnosis present

## 2020-08-21 DIAGNOSIS — I2510 Atherosclerotic heart disease of native coronary artery without angina pectoris: Secondary | ICD-10-CM | POA: Diagnosis not present

## 2020-08-21 DIAGNOSIS — I252 Old myocardial infarction: Secondary | ICD-10-CM | POA: Diagnosis not present

## 2020-08-21 DIAGNOSIS — Z7902 Long term (current) use of antithrombotics/antiplatelets: Secondary | ICD-10-CM | POA: Diagnosis not present

## 2020-08-21 DIAGNOSIS — Z955 Presence of coronary angioplasty implant and graft: Secondary | ICD-10-CM

## 2020-08-21 DIAGNOSIS — Z7982 Long term (current) use of aspirin: Secondary | ICD-10-CM

## 2020-08-21 DIAGNOSIS — F32A Depression, unspecified: Secondary | ICD-10-CM | POA: Diagnosis present

## 2020-08-21 DIAGNOSIS — R079 Chest pain, unspecified: Secondary | ICD-10-CM

## 2020-08-21 DIAGNOSIS — Z20822 Contact with and (suspected) exposure to covid-19: Secondary | ICD-10-CM | POA: Diagnosis present

## 2020-08-21 DIAGNOSIS — F17200 Nicotine dependence, unspecified, uncomplicated: Secondary | ICD-10-CM | POA: Diagnosis present

## 2020-08-21 DIAGNOSIS — I10 Essential (primary) hypertension: Secondary | ICD-10-CM | POA: Diagnosis present

## 2020-08-21 DIAGNOSIS — R778 Other specified abnormalities of plasma proteins: Secondary | ICD-10-CM

## 2020-08-21 DIAGNOSIS — Z79899 Other long term (current) drug therapy: Secondary | ICD-10-CM

## 2020-08-21 DIAGNOSIS — F17218 Nicotine dependence, cigarettes, with other nicotine-induced disorders: Secondary | ICD-10-CM | POA: Diagnosis not present

## 2020-08-21 LAB — CBC
HCT: 45.7 % (ref 36.0–46.0)
Hemoglobin: 16 g/dL — ABNORMAL HIGH (ref 12.0–15.0)
MCH: 34.3 pg — ABNORMAL HIGH (ref 26.0–34.0)
MCHC: 35 g/dL (ref 30.0–36.0)
MCV: 97.9 fL (ref 80.0–100.0)
Platelets: 214 10*3/uL (ref 150–400)
RBC: 4.67 MIL/uL (ref 3.87–5.11)
RDW: 12.1 % (ref 11.5–15.5)
WBC: 6.4 10*3/uL (ref 4.0–10.5)
nRBC: 0 % (ref 0.0–0.2)

## 2020-08-21 LAB — BASIC METABOLIC PANEL
Anion gap: 8 (ref 5–15)
BUN: 12 mg/dL (ref 6–20)
CO2: 24 mmol/L (ref 22–32)
Calcium: 9.7 mg/dL (ref 8.9–10.3)
Chloride: 104 mmol/L (ref 98–111)
Creatinine, Ser: 0.88 mg/dL (ref 0.44–1.00)
GFR, Estimated: >60 mL/min (ref >60)
Glucose, Bld: 143 mg/dL — ABNORMAL HIGH (ref 70–99)
Potassium: 3.8 mmol/L (ref 3.5–5.1)
Sodium: 136 mmol/L (ref 135–145)

## 2020-08-21 LAB — HEPARIN LEVEL (UNFRACTIONATED): Heparin Unfractionated: 0.13 IU/mL — ABNORMAL LOW (ref 0.30–0.70)

## 2020-08-21 LAB — TROPONIN I (HIGH SENSITIVITY)
Troponin I (High Sensitivity): 67 ng/L — ABNORMAL HIGH (ref <18)
Troponin I (High Sensitivity): 76 ng/L — ABNORMAL HIGH (ref <18)
Troponin I (High Sensitivity): 86 ng/L — ABNORMAL HIGH (ref <18)
Troponin I (High Sensitivity): 90 ng/L — ABNORMAL HIGH (ref <18)

## 2020-08-21 LAB — PROTIME-INR
INR: 0.9 (ref 0.8–1.2)
Prothrombin Time: 11.6 seconds (ref 11.4–15.2)

## 2020-08-21 LAB — RESPIRATORY PANEL BY RT PCR (FLU A&B, COVID)
Influenza A by PCR: NEGATIVE
Influenza B by PCR: NEGATIVE
SARS Coronavirus 2 by RT PCR: NEGATIVE

## 2020-08-21 LAB — APTT: aPTT: 25 seconds (ref 24–36)

## 2020-08-21 MED ORDER — ASPIRIN 81 MG PO CHEW: 324.0000 mg | CHEWABLE_TABLET | ORAL | Status: DC

## 2020-08-21 MED ORDER — ASPIRIN 81 MG PO CHEW
324.0000 mg | CHEWABLE_TABLET | Freq: Once | ORAL | Status: AC
  Administered 2020-08-21: 324 mg via ORAL
  Filled 2020-08-21: qty 4

## 2020-08-21 MED ORDER — ASPIRIN EC 81 MG PO TBEC
81.0000 mg | DELAYED_RELEASE_TABLET | Freq: Every day | ORAL | Status: DC
  Administered 2020-08-22 – 2020-08-24 (×2): 81 mg via ORAL
  Filled 2020-08-21 (×2): qty 1

## 2020-08-21 MED ORDER — ATORVASTATIN CALCIUM 80 MG PO TABS
80.0000 mg | ORAL_TABLET | Freq: Every day | ORAL | Status: DC
  Administered 2020-08-21 – 2020-08-24 (×4): 80 mg via ORAL
  Filled 2020-08-21: qty 4
  Filled 2020-08-21 (×3): qty 1

## 2020-08-21 MED ORDER — ASPIRIN 300 MG RE SUPP: 300.0000 mg | RECTAL | Status: DC

## 2020-08-21 MED ORDER — HEPARIN BOLUS VIA INFUSION
3600.0000 [IU] | Freq: Once | INTRAVENOUS | Status: AC
  Administered 2020-08-21: 3600 [IU] via INTRAVENOUS
  Filled 2020-08-21: qty 3600

## 2020-08-21 MED ORDER — HEPARIN BOLUS VIA INFUSION
1800.0000 [IU] | Freq: Once | INTRAVENOUS | Status: AC
  Administered 2020-08-21: 1800 [IU] via INTRAVENOUS
  Filled 2020-08-21: qty 1800

## 2020-08-21 MED ORDER — METOPROLOL SUCCINATE ER 50 MG PO TB24
50.0000 mg | ORAL_TABLET | Freq: Every day | ORAL | Status: DC
  Administered 2020-08-21 – 2020-08-24 (×3): 50 mg via ORAL
  Filled 2020-08-21 (×3): qty 1

## 2020-08-21 MED ORDER — ONDANSETRON HCL 4 MG/2ML IJ SOLN: 4.0000 mg | Freq: Four times a day (QID) | INTRAMUSCULAR | Status: DC | PRN

## 2020-08-21 MED ORDER — NITROGLYCERIN 0.4 MG SL SUBL: 0.4000 mg | SUBLINGUAL_TABLET | SUBLINGUAL | Status: DC | PRN

## 2020-08-21 MED ORDER — NICOTINE 21 MG/24HR TD PT24
21.0000 mg | MEDICATED_PATCH | Freq: Every day | TRANSDERMAL | Status: DC
  Filled 2020-08-21: qty 1

## 2020-08-21 MED ORDER — NITROGLYCERIN 2 % TD OINT
1.0000 [in_us] | TOPICAL_OINTMENT | Freq: Once | TRANSDERMAL | Status: AC
  Administered 2020-08-21: 1 [in_us] via TOPICAL
  Filled 2020-08-21: qty 1

## 2020-08-21 MED ORDER — HEPARIN (PORCINE) 25000 UT/250ML-% IV SOLN
1050.0000 [IU]/h | INTRAVENOUS | Status: DC
  Administered 2020-08-21: 700 [IU]/h via INTRAVENOUS
  Administered 2020-08-22 – 2020-08-23 (×2): 1050 [IU]/h via INTRAVENOUS
  Filled 2020-08-21 (×3): qty 250

## 2020-08-21 MED ORDER — ACETAMINOPHEN 325 MG PO TABS
650.0000 mg | ORAL_TABLET | ORAL | Status: DC | PRN
  Administered 2020-08-21: 650 mg via ORAL
  Filled 2020-08-21: qty 2

## 2020-08-21 NOTE — ED Provider Notes (Signed)
[facility] Emergency Department Provider Note    First MD Initiated Contact with Patient 08/21/20 1150     (approximate)  I have reviewed the triage vital signs and the nursing notes.   HISTORY  Chief Complaint Back Pain    HPI Sylvia Wiggins is a 59 y.o. female presents to the ER for evaluation of midsternal chest pain and back pain that awoke her from sleep this morning.  Does have a history of previous MI she does smoke.  States the pain was achy dull like a tightness.  Felt like her previous MI lasting greater than 1 hour did become diaphoretic.  She took nitro with some improvement but after she took a second dose of nitro she came to the ER.  States that currently her pain is 5 out of 10.    Past Medical History:  Diagnosis Date  . Abscess, gluteal, left    Recurrent drainage, followed by [facility].  . Arthritis   . CAD (coronary artery disease)    Stents in Clogged artery at [facility] June 2014  . Depression   . Elevated lipids   . Hyperlipidemia   . Hypertension   . Myocardial infarction [facility]) June 2014   Family History  Problem Relation Age of Onset  . Cancer Mother        colon  . Heart disease Father        Died of Heart Attack   Past Surgical History:  Procedure Laterality Date  . CARDIAC CATHETERIZATION N/A 08/30/2016   Procedure: Left Heart Cath and Coronary Angiography;  Surgeon: Marcina Millard, MD;  Location: [facility] INVASIVE CV LAB;  Service: Cardiovascular;  Laterality: N/A;  . CARDIAC SURGERY  03/30/2013   stent placement  . CORONARY ANGIOPLASTY     cardiac stents x 2  . INGUINAL HERNIA REPAIR Right 02/26/2020   Procedure: HERNIA REPAIR INGUINAL ADULT;  Surgeon: Sung Amabile, DO;  Location: [facility] ORS;  Service: General;  Laterality: Right;  . LEFT HEART CATH AND CORS/GRAFTS ANGIOGRAPHY N/A 02/18/2018   Procedure: LEFT HEART CATH AND CORS/GRAFTS ANGIOGRAPHY;  Surgeon: Dalia Heading, MD;  Location: [facility] INVASIVE CV LAB;  Service:  Cardiovascular;  Laterality: N/A;  . RECTAL SURGERY  Anal Fissure  . XI ROBOTIC ASSISTED INGUINAL HERNIA REPAIR WITH MESH Right 05/16/2019   Procedure: XI ROBOTIC ASSISTED Right INGUINAL HERNIA REPAIR WITH MESH;  Surgeon: Sung Amabile, DO;  Location: [facility] ORS;  Service: General;  Laterality: Right;   Patient Active Problem List   Diagnosis Date Noted  . HTN (hypertension) 02/16/2018  . Abnormal EKG 02/16/2018  . Chest pain 02/16/2018  . Chest pain, rule out acute myocardial infarction 08/30/2016  . NSTEMI (non-ST elevated myocardial infarction) (HCC) 08/30/2016  . CAD (coronary artery disease) 01/26/2016  . Hypertension 01/26/2016  . Depression 01/26/2016  . Hyperlipidemia 01/26/2016  . Tremor of both hands 01/26/2016  . Chronic right-sided low back pain with right-sided sciatica 01/26/2016  . Major depression 10/05/2015      Prior to Admission medications   Not on File    Allergies Penicillins    Social History Social History   Tobacco Use  . Smoking status: Current Every Day Smoker    Packs/day: 0.25    Types: Cigarettes  . Smokeless tobacco: Never Used  Vaping Use  . Vaping Use: Never used  Substance Use Topics  . Alcohol use: No    Alcohol/week: 0.0 standard drinks  . Drug use: No    Review  of Systems Patient denies headaches, rhinorrhea, blurry vision, numbness, shortness of breath, chest pain, edema, cough, abdominal pain, nausea, vomiting, diarrhea, dysuria, fevers, rashes or hallucinations unless otherwise stated above in HPI. ____________________________________________   PHYSICAL EXAM:  VITAL SIGNS: Vitals:   08/21/20 1130 08/21/20 1200  BP: (!) 157/92 (!) 150/84  Pulse: 68 60  Resp: 18 12  Temp:    SpO2: 99% 99%    Constitutional: Alert and oriented.  Eyes: Conjunctivae are normal.  Head: Atraumatic. Nose: No congestion/rhinnorhea. Mouth/Throat: Mucous membranes are moist.   Neck: No stridor. Painless ROM.  Cardiovascular: Normal rate,  regular rhythm. Grossly normal heart sounds.  Good peripheral circulation. Respiratory: Normal respiratory effort.  No retractions. Lungs CTAB. Gastrointestinal: Soft and nontender. No distention. No abdominal bruits. No CVA tenderness. Genitourinary:  Musculoskeletal: No lower extremity tenderness nor edema.  No joint effusions. Neurologic:  Normal speech and language. No gross focal neurologic deficits are appreciated. No facial droop Skin:  Skin is warm, dry and intact. No rash noted. Psychiatric: Mood and affect are normal. Speech and behavior are normal.  ____________________________________________   LABS (all labs ordered are listed, but only abnormal results are displayed)  Results for orders placed or performed during the hospital encounter of 08/21/20 (from the past 24 hour(s))  Basic metabolic panel     Status: Abnormal   Collection Time: 08/21/20 10:57 AM  Result Value Ref Range   Sodium 136 135 - 145 mmol/L   Potassium 3.8 3.5 - 5.1 mmol/L   Chloride 104 98 - 111 mmol/L   CO2 24 22 - 32 mmol/L   Glucose, Bld 143 (H) 70 - 99 mg/dL   BUN 12 6 - 20 mg/dL   Creatinine, Ser 1.49 0.44 - 1.00 mg/dL   Calcium 9.7 8.9 - 70.2 mg/dL   GFR, Estimated >63 >78 mL/min   Anion gap 8 5 - 15  CBC     Status: Abnormal   Collection Time: 08/21/20 10:57 AM  Result Value Ref Range   WBC 6.4 4.0 - 10.5 K/uL   RBC 4.67 3.87 - 5.11 MIL/uL   Hemoglobin 16.0 (H) 12.0 - 15.0 g/dL   HCT 58.8 36 - 46 %   MCV 97.9 80.0 - 100.0 fL   MCH 34.3 (H) 26.0 - 34.0 pg   MCHC 35.0 30.0 - 36.0 g/dL   RDW 50.2 77.4 - 12.8 %   Platelets 214 150 - 400 K/uL   nRBC 0.0 0.0 - 0.2 %  Troponin I (High Sensitivity)     Status: Abnormal   Collection Time: 08/21/20 10:57 AM  Result Value Ref Range   Troponin I (High Sensitivity) 67 (H) <18 ng/L   ____________________________________________  EKG My review and personal interpretation at Time: 10:22   Indication: chest pain  Rate: 65  Rhythm: sinus Axis:  normal Other: normal intervals, no stemi ____________________________________________  RADIOLOGY  I personally reviewed all radiographic images ordered to evaluate for the above acute complaints and reviewed radiology reports and findings.  These findings were personally discussed with the patient.  Please see medical record for radiology report.  ____________________________________________   PROCEDURES  Procedure(s) performed:  .Critical Care Performed by: Willy Eddy, MD Authorized by: Willy Eddy, MD   Critical care provider statement:    Critical care time (minutes):  34   Critical care time was exclusive of:  Separately billable procedures and treating other patients   Critical care was necessary to treat or prevent imminent or life-threatening deterioration of the  following conditions:  Cardiac failure   Critical care was time spent personally by me on the following activities:  Development of treatment plan with patient or surrogate, discussions with consultants, evaluation of patient's response to treatment, examination of patient, obtaining history from patient or surrogate, ordering and performing treatments and interventions, ordering and review of laboratory studies, ordering and review of radiographic studies, pulse oximetry, re-evaluation of patient's condition and review of old charts      Critical Care performed: yes ____________________________________________   INITIAL IMPRESSION / ASSESSMENT AND PLAN / ED COURSE  Pertinent labs & imaging results that were available during my care of the patient were reviewed by me and considered in my medical decision making (see chart for details).   DDX: ACS, pericarditis, esophagitis, boerhaaves, pe, dissection, pna, bronchitis, costochondritis   KESLEY MULLENS is a 59 y.o. who presents to the ED with chest pain as described above.  Patient nontoxic-appearing.  Does present with concerning symptoms given her  history of MI stating that she has pain similar to previous.  Will send blood work for above differential.  I have a lower suspicion for PE.  Has minimal respiratory symptoms mildly hypertensive.  Will give nitro.  EKG without evidence of acute ischemia may have some subtle depressions but certainly concerning for ACS.  The patient will be placed on continuous pulse oximetry and telemetry for monitoring.  Laboratory evaluation will be sent to evaluate for the above complaints.     Clinical Course as of Aug 22 1247  Sat Aug 21, 2020  1216 Patient reassessed.  She remains in sinus rhythm in the mid 60s on cardiac monitor.  Troponin is elevated concerning for NSTEMI.  Will heparinize will give aspirin.  Will discuss with hospitalist for admission.   [PR]    Clinical Course User Index [PR] Willy Eddy, MD    The patient was evaluated in Emergency Department today for the symptoms described in the history of present illness. He/she was evaluated in the context of the global COVID-19 pandemic, which necessitated consideration that the patient might be at risk for infection with the SARS-CoV-2 virus that causes COVID-19. Institutional protocols and algorithms that pertain to the evaluation of patients at risk for COVID-19 are in a state of rapid change based on information released by regulatory bodies including the CDC and federal and state organizations. These policies and algorithms were followed during the patient's care in the ED.  As part of my medical decision making, I reviewed the following data within the electronic MEDICAL RECORD NUMBER Nursing notes reviewed and incorporated, Labs reviewed, notes from prior ED visits and [place] Controlled Substance Database   ____________________________________________   FINAL CLINICAL IMPRESSION(S) / ED DIAGNOSES  Final diagnoses:  Chest pain, unspecified type  Elevated troponin I level      NEW MEDICATIONS STARTED DURING THIS VISIT:  New  Prescriptions   No medications on file     Note:  This document was prepared using Dragon voice recognition software and may include unintentional dictation errors.    Willy Eddy, MD 08/21/20 1248

## 2020-08-21 NOTE — ED Triage Notes (Signed)
Pt to ER with reports of upper back pain that feels the same as her previous MI.  Pt reports pain 2 nights ago that was relieved by NTG. Pt states pain started again this AM and has been unrelieved by 2 NTG.  Pt denies n/v, diaphoresis.  Pt denies radiation of pain.

## 2020-08-21 NOTE — ED Notes (Signed)
Pt given graham crackers at this time. °

## 2020-08-21 NOTE — H&P (Signed)
History and Physical    Sylvia Wiggins DOB: 1961/07/07 DOA: 08/21/2020  PCP: Patient, [name]   Patient coming from: Home  I have personally briefly reviewed patient's old medical records in [facility]  Chief Complaint: Back pain  HPI: Sylvia Wiggins is a 59 y.o. female with medical history significant for coronary artery disease status post stent angioplasty, nicotine dependence, history of hypertension, depression and dyslipidemia who presents to the ER for evaluation of mid sternal chest pain/pressure and upper back pain that woke her up from sleep on the morning of her admission.  She rated her pain a 10 x 10 in intensity at its worst and states that it was similar to the pain she had when she had a myocardial infarction.  Chest pain was associated with diaphoresis but she denies having any nausea, vomiting, shortness of breath, dizziness or lightheadedness.  Pain was nonradiating. Patient had some improvement in her pain with sublingual nitroglycerin initially but the pain recurred prompting her visit to the emergency room. With  Nitropaste she is currently chest pain-free . Labs show sodium 136, potassium 3.8, chloride 104, bicarb 24, glucose 143, BUN 12, creatinine 0.8, calcium 9.7, WBC 6.4, hemoglobin 16, hematocrit 45, MCV 97, RDW 12.1, platelet count 214, PT 11.6, INR 0.9 Chest x-ray reviewed by me shows no acute findings Twelve-lead EKG shows sinus rhythm   ED Course: Patient is a 59 year old female with a history of coronary artery disease status post angioplasty, nicotine dependence, hypertension and dyslipidemia who presents to the emergency room for evaluation of chest pain/back pain which she states feels similar to her last MI.  Patient bumped her troponin from 67 >> 76.  Her pain has resolved following administration of nitroglycerin.  She was started on heparin drip and cardiology has been consulted.  She will be admitted to the hospital for further  evaluation.   Review of Systems: As per HPI otherwise 10 point review of systems negative.    Past Medical History:  Diagnosis Date  . Abscess, gluteal, left    Recurrent drainage, followed by [facility].  . Arthritis   . CAD (coronary artery disease)    Stents in Clogged artery at [facility] June 2014  . Depression   . Elevated lipids   . Hyperlipidemia   . Hypertension   . Myocardial infarction [facility]) June 2014    Past Surgical History:  Procedure Laterality Date  . CARDIAC CATHETERIZATION N/A 08/30/2016   Procedure: Left Heart Cath and Coronary Angiography;  Surgeon: Marcina Millard, MD;  Location: [facility] INVASIVE CV LAB;  Service: Cardiovascular;  Laterality: N/A;  . CARDIAC SURGERY  03/30/2013   stent placement  . CORONARY ANGIOPLASTY     cardiac stents x 2  . INGUINAL HERNIA REPAIR Right 02/26/2020   Procedure: HERNIA REPAIR INGUINAL ADULT;  Surgeon: Sung Amabile, DO;  Location: [facility] ORS;  Service: General;  Laterality: Right;  . LEFT HEART CATH AND CORS/GRAFTS ANGIOGRAPHY N/A 02/18/2018   Procedure: LEFT HEART CATH AND CORS/GRAFTS ANGIOGRAPHY;  Surgeon: Dalia Heading, MD;  Location: [facility] INVASIVE CV LAB;  Service: Cardiovascular;  Laterality: N/A;  . RECTAL SURGERY  Anal Fissure  . XI ROBOTIC ASSISTED INGUINAL HERNIA REPAIR WITH MESH Right 05/16/2019   Procedure: XI ROBOTIC ASSISTED Right INGUINAL HERNIA REPAIR WITH MESH;  Surgeon: Sung Amabile, DO;  Location: [facility] ORS;  Service: General;  Laterality: Right;     reports that she has been smoking cigarettes. She has been smoking about 0.25  packs per day. She has never used smokeless tobacco. She reports that she does not drink alcohol and does not use drugs.  Allergies  Allergen Reactions  . Penicillins Nausea Only    Did it involve swelling of the face/tongue/throat, SOB, or low BP? No Did it involve sudden or severe rash/hives, skin peeling, or any reaction on the inside of your mouth or nose? No Did you need to seek medical  attention at a hospital or doctor's office? No When did it last happen?1990 If all above answers are "NO", may proceed with cephalosporin use.     Family History  Problem Relation Age of Onset  . Cancer Mother        colon  . Heart disease Father        Died of Heart Attack     Prior to Admission medications   Not on File    Physical Exam: Vitals:   08/21/20 1100 08/21/20 1130 08/21/20 1200 08/21/20 1330  BP: (!) 158/85 (!) 157/92 (!) 150/84 (!) 142/98  Pulse: (!) 58 68 60 62  Resp: 10 18 12 13   Temp:      TempSrc:      SpO2: 100% 99% 99% 97%  Weight:      Height:         Vitals:   08/21/20 1100 08/21/20 1130 08/21/20 1200 08/21/20 1330  BP: (!) 158/85 (!) 157/92 (!) 150/84 (!) 142/98  Pulse: (!) 58 68 60 62  Resp: 10 18 12 13   Temp:      TempSrc:      SpO2: 100% 99% 99% 97%  Weight:      Height:        Constitutional: NAD, alert and oriented x 3.  Eyes: PERRL, lids and conjunctivae normal ENMT: Mucous membranes are moist.  Neck: normal, supple, no masses, no thyromegaly Respiratory: clear to auscultation bilaterally, no wheezing, no crackles. Normal respiratory effort. No accessory muscle use.  Cardiovascular: Regular rate and rhythm, no murmurs / rubs / gallops. No extremity edema. 2+ pedal pulses. No carotid bruits.  Abdomen: no tenderness, no masses palpated. No hepatosplenomegaly. Bowel sounds positive.  Musculoskeletal: no clubbing / cyanosis. No joint deformity upper and lower extremities.  Skin: no rashes, lesions, ulcers.  Neurologic: No gross focal neurologic deficit. Psychiatric: Normal mood and affect.   Labs on Admission: I have personally reviewed following labs and imaging studies  CBC: Recent Labs  Lab 08/21/20 1057  WBC 6.4  HGB 16.0*  HCT 45.7  MCV 97.9  PLT 214   Basic Metabolic Panel: Recent Labs  Lab 08/21/20 1057  NA 136  K 3.8  CL 104  CO2 24  GLUCOSE 143*  BUN 12  CREATININE 0.88  CALCIUM 9.7    GFR: Estimated Creatinine Clearance: 58.5 mL/min (by C-G formula based on SCr of 0.88 mg/dL). Liver Function Tests: No results for input(s): AST, ALT, ALKPHOS, BILITOT, PROT, ALBUMIN in the last 168 hours. No results for input(s): LIPASE, AMYLASE in the last 168 hours. No results for input(s): AMMONIA in the last 168 hours. Coagulation Profile: Recent Labs  Lab 08/21/20 1228  INR 0.9   Cardiac Enzymes: No results for input(s): CKTOTAL, CKMB, CKMBINDEX, TROPONINI in the last 168 hours. BNP (last 3 results) No results for input(s): PROBNP in the last 8760 hours. HbA1C: No results for input(s): HGBA1C in the last 72 hours. CBG: No results for input(s): GLUCAP in the last 168 hours. Lipid Profile: No results for input(s): CHOL, HDL,  LDLCALC, TRIG, CHOLHDL, LDLDIRECT in the last 72 hours. Thyroid Function Tests: No results for input(s): TSH, T4TOTAL, FREET4, T3FREE, THYROIDAB in the last 72 hours. Anemia Panel: No results for input(s): VITAMINB12, FOLATE, FERRITIN, TIBC, IRON, RETICCTPCT in the last 72 hours. Urine analysis: No results found for: COLORURINE, APPEARANCEUR, LABSPEC, PHURINE, GLUCOSEU, HGBUR, BILIRUBINUR, KETONESUR, PROTEINUR, UROBILINOGEN, NITRITE, LEUKOCYTESUR  Radiological Exams on Admission: DG Chest 2 View  Result Date: 08/21/2020 CLINICAL DATA:  Chest pain EXAM: CHEST - 2 VIEW COMPARISON:  December 03, 2019 FINDINGS: Lungs are clear. Heart size and pulmonary vascularity are normal. No adenopathy. Apparent stent in the left anterior descending coronary artery. No pneumothorax. No bone lesions. IMPRESSION: Apparent coronary artery stent.  Heart size normal.  Lungs clear. Electronically Signed   By: Bretta Bang III M.D.   On: 08/21/2020 11:10    EKG: Independently reviewed. Normal sinus rhythm  Assessment/Plan Principal Problem:   Unstable angina (HCC) Active Problems:   CAD (coronary artery disease)   Depression   HTN (hypertension)   Nicotine  dependence     Unstable angina possible non-ST elevation MI In a patient with known coronary artery disease status post stent angioplasty Patient continues to have chest pain at rest with improvement following administration of nitroglycerin Continue heparin drip per protocol Start patient on metoprolol and pravastatin Obtain 2D echocardiogram to assess LVEF and rule out regional wall motion abnormality Consult cardiology   Nicotine dependence Smoking cessation has been discussed with patient She declines a nicotine transdermal patch at this time  Hypertension Uncontrolled Start patient on Toprol-XL 50 mg daily   DVT prophylaxis: Heparin Code Status: Full code Family Communication: Greater than 50% of time was spent discussing patient's condition and plan of care with her at the bedside.  All questions and concerns have been addressed.  She verbalizes understanding and agrees with the plan. Disposition Plan: Back to previous home environment Consults called: Cardiology    Craig Wisnewski MD [facility]     08/21/2020, 1:46 PM

## 2020-08-21 NOTE — ED Notes (Signed)
Pt ambulatory to the restroom at this time.  

## 2020-08-21 NOTE — Progress Notes (Signed)
   08/21/20 1818  Assess: MEWS Score  Temp 98.2 F (36.8 C)  BP (!) 148/84  Pulse Rate (!) 55  Resp 16  SpO2 100 %  O2 Device Room Air    Pt arrived to room 257. Call bell within reach. Bell wheels locked. Pt made aware to use that call bell if needed to use BSC.

## 2020-08-21 NOTE — Plan of Care (Signed)

## 2020-08-21 NOTE — Progress Notes (Signed)
ANTICOAGULATION CONSULT NOTE - Initial Consult  Pharmacy Consult for Heparin Indication: chest pain/ACS  Allergies  Allergen Reactions  . Penicillins Nausea Only    Did it involve swelling of the face/tongue/throat, SOB, or low BP? No Did it involve sudden or severe rash/hives, skin peeling, or any reaction on the inside of your mouth or nose? No Did you need to seek medical attention at a hospital or doctor's office? No When did it last happen?1990 If all above answers are "NO", may proceed with cephalosporin use.     Patient Measurements: Height: 5\' 2"  (157.5 cm) Weight: 61.2 kg (135 lb) IBW/kg (Calculated) : 50.1 Heparin Dosing Weight: 61.2 kg  Vital Signs: Temp: 98.5 F (36.9 C) (11/13 1051) Temp Source: Oral (11/13 1051) BP: 150/84 (11/13 1200) Pulse Rate: 60 (11/13 1200)  Labs: Recent Labs    08/21/20 1057  HGB 16.0*  HCT 45.7  PLT 214  CREATININE 0.88  TROPONINIHS 67*    Estimated Creatinine Clearance: 58.5 mL/min (by C-G formula based on SCr of 0.88 mg/dL).   Medical History: Past Medical History:  Diagnosis Date  . Abscess, gluteal, left    Recurrent drainage, followed by [facility].  . Arthritis   . CAD (coronary artery disease)    Stents in Clogged artery at [facility] June 2014  . Depression   . Elevated lipids   . Hyperlipidemia   . Hypertension   . Myocardial infarction [facility]) June 2014    Assessment: 59 yo female here with upper back pain that feels the same as her previous MI. No oral anticoagulants noted on PTA med list (Med rec not yet complete).   Goal of Therapy:  Heparin level 0.3-0.7 units/ml Monitor platelets by anticoagulation protocol: Yes   Plan:  Add on aPTT and INR - ok per lab Heparin bolus 3600 units IV x1 then heparin infusion at 700 units/hr  Heparin level 6h after start of infusion, CBC in AM  Pharmacy will continue to follow  67 L 08/21/2020,12:23 PM

## 2020-08-21 NOTE — ED Notes (Signed)
Dr. Agbata, MD at bedside at this time.  

## 2020-08-21 NOTE — Plan of Care (Signed)
  Problem: Education: Goal: Knowledge of General Education information will improve Description: Including pain rating scale, medication(s)/side effects and non-pharmacologic comfort measures Outcome: Progressing   Problem: Health Behavior/Discharge Planning: Goal: Ability to manage health-related needs will improve Outcome: Progressing   Problem: Clinical Measurements: Goal: Cardiovascular complication will be avoided Outcome: Progressing   Problem: Activity: Goal: Risk for activity intolerance will decrease Outcome: Progressing   Problem: Pain Managment: Goal: General experience of comfort will improve Outcome: Progressing   Problem: Safety: Goal: Ability to remain free from injury will improve Outcome: Progressing   

## 2020-08-21 NOTE — ED Notes (Signed)
Pt transported to XRay 

## 2020-08-21 NOTE — Progress Notes (Signed)
ANTICOAGULATION CONSULT NOTE   Pharmacy Consult for Heparin Indication: chest pain/ACS  Allergies  Allergen Reactions  . Penicillins Nausea Only    Did it involve swelling of the face/tongue/throat, SOB, or low BP? No Did it involve sudden or severe rash/hives, skin peeling, or any reaction on the inside of your mouth or nose? No Did you need to seek medical attention at a hospital or doctor's office? No When did it last happen?1990 If all above answers are "NO", may proceed with cephalosporin use.     Patient Measurements: Height: 5\' 2"  (157.5 cm) Weight: 61.2 kg (135 lb) IBW/kg (Calculated) : 50.1 Heparin Dosing Weight: 61.2 kg  Vital Signs: Temp: 98.2 F (36.8 C) (11/13 1818) Temp Source: Oral (11/13 1051) BP: 148/84 (11/13 1818) Pulse Rate: 55 (11/13 1818)  Labs: Recent Labs    08/21/20 1057 08/21/20 1057 08/21/20 1228 08/21/20 1541 08/21/20 1755 08/21/20 2001  HGB 16.0*  --   --   --   --   --   HCT 45.7  --   --   --   --   --   PLT 214  --   --   --   --   --   APTT  --   --  25  --   --   --   LABPROT  --   --  11.6  --   --   --   INR  --   --  0.9  --   --   --   HEPARINUNFRC  --   --   --   --   --  0.13*  CREATININE 0.88  --   --   --   --   --   TROPONINIHS 67*   < > 76* 86* 90*  --    < > = values in this interval not displayed.    Estimated Creatinine Clearance: 58.5 mL/min (by C-G formula based on SCr of 0.88 mg/dL).   Medical History: Past Medical History:  Diagnosis Date  . Abscess, gluteal, left    Recurrent drainage, followed by [facility].  . Arthritis   . CAD (coronary artery disease)    Stents in Clogged artery at [facility] June 2014  . Depression   . Elevated lipids   . Hyperlipidemia   . Hypertension   . Myocardial infarction [facility]) June 2014    Assessment: 59 yo female here with upper back pain that feels the same as her previous MI. No oral anticoagulants noted on PTA med list (Med rec not yet complete).   11/13   @ 2025 HL  0.13, SUBtherapeutic. Confirmed with nursing no heparin interruptions or line issues.   Goal of Therapy:  Heparin level 0.3-0.7 units/ml Monitor platelets by anticoagulation protocol: Yes   Plan:  Add on aPTT and INR - ok per lab Heparin bolus 1800 units IV x1 then heparin infusion at 850 units/hr - nursing aware of changes. Heparin level 6h after start of infusion, CBC in AM  Pharmacy will continue to follow  12/13 08/21/2020,8:31 PM

## 2020-08-21 NOTE — ED Notes (Signed)
Pt presents to the ED for upper back pain. Pt states this is the same pain she had when she had a previous MI. Pt states she had an episode of pain a few days back that was relieved by NTG. Pt took NTG at 0900 and 1000 with no relief. Denies NVD. Pt is A&Ox4 and NAD. No radiation of pain.

## 2020-08-22 ENCOUNTER — Inpatient Hospital Stay
Admit: 2020-08-22 | Discharge: 2020-08-22 | Disposition: A | Discharge status: Home / Self Care | Payer: Medicare HMO | Attending: Internal Medicine | Admitting: Internal Medicine

## 2020-08-22 DIAGNOSIS — F17218 Nicotine dependence, cigarettes, with other nicotine-induced disorders: Secondary | ICD-10-CM

## 2020-08-22 LAB — CBC
HCT: 42.1 % (ref 36.0–46.0)
Hemoglobin: 14.8 g/dL (ref 12.0–15.0)
MCH: 34.3 pg — ABNORMAL HIGH (ref 26.0–34.0)
MCHC: 35.2 g/dL (ref 30.0–36.0)
MCV: 97.5 fL (ref 80.0–100.0)
Platelets: 193 10*3/uL (ref 150–400)
RBC: 4.32 MIL/uL (ref 3.87–5.11)
RDW: 12.1 % (ref 11.5–15.5)
WBC: 7.2 10*3/uL (ref 4.0–10.5)
nRBC: 0 % (ref 0.0–0.2)

## 2020-08-22 LAB — HIV ANTIBODY (ROUTINE TESTING W REFLEX): HIV Screen 4th Generation wRfx: NONREACTIVE

## 2020-08-22 LAB — HEMOGLOBIN A1C
Hgb A1c MFr Bld: 5.6 % (ref 4.8–5.6)
Mean Plasma Glucose: 114.02 mg/dL

## 2020-08-22 LAB — GLUCOSE, CAPILLARY
Glucose-Capillary: 98 mg/dL (ref 70–99)
Glucose-Capillary: 158 mg/dL — ABNORMAL HIGH (ref 70–99)
Glucose-Capillary: 95 mg/dL (ref 70–99)
Glucose-Capillary: 146 mg/dL — ABNORMAL HIGH (ref 70–99)

## 2020-08-22 LAB — LIPID PANEL
Cholesterol: 247 mg/dL — ABNORMAL HIGH (ref 0–200)
HDL: 43 mg/dL (ref >40)
LDL Cholesterol: 153 mg/dL — ABNORMAL HIGH (ref 0–99)
Total CHOL/HDL Ratio: 5.7 RATIO
Triglycerides: 256 mg/dL — ABNORMAL HIGH (ref <150)
VLDL: 51 mg/dL — ABNORMAL HIGH (ref 0–40)

## 2020-08-22 LAB — HEPARIN LEVEL (UNFRACTIONATED)
Heparin Unfractionated: 0.21 IU/mL — ABNORMAL LOW (ref 0.30–0.70)
Heparin Unfractionated: 0.52 IU/mL (ref 0.30–0.70)
Heparin Unfractionated: 0.5 IU/mL (ref 0.30–0.70)

## 2020-08-22 MED ORDER — HEPARIN BOLUS VIA INFUSION
2000.0000 [IU] | Freq: Once | INTRAVENOUS | Status: AC
  Administered 2020-08-22: 2000 [IU] via INTRAVENOUS
  Filled 2020-08-22: qty 2000

## 2020-08-22 MED ORDER — TRAMADOL HCL 50 MG PO TABS
50.0000 mg | ORAL_TABLET | Freq: Once | ORAL | Status: AC
  Administered 2020-08-22: 50 mg via ORAL
  Filled 2020-08-22: qty 1

## 2020-08-22 MED ORDER — INSULIN ASPART 100 UNIT/ML ~~LOC~~ SOLN: 0.0000 [IU] | Freq: Every day | SUBCUTANEOUS | Status: DC

## 2020-08-22 MED ORDER — LOSARTAN POTASSIUM 25 MG PO TABS
25.0000 mg | ORAL_TABLET | Freq: Every day | ORAL | Status: DC
  Administered 2020-08-22 – 2020-08-24 (×3): 25 mg via ORAL
  Filled 2020-08-22 (×3): qty 1

## 2020-08-22 MED ORDER — SODIUM CHLORIDE 0.9% FLUSH
3.0000 mL | Freq: Two times a day (BID) | INTRAVENOUS | Status: DC
  Administered 2020-08-22 – 2020-08-24 (×4): 3 mL via INTRAVENOUS

## 2020-08-22 MED ORDER — INSULIN ASPART 100 UNIT/ML ~~LOC~~ SOLN
0.0000 [IU] | Freq: Three times a day (TID) | SUBCUTANEOUS | Status: DC
  Administered 2020-08-22 – 2020-08-23 (×2): 2 [IU] via SUBCUTANEOUS
  Filled 2020-08-22 (×2): qty 1

## 2020-08-22 NOTE — Progress Notes (Addendum)
PROGRESS NOTE  Sylvia Wiggins MPN:[id number] DOB: 04/14/1961 DOA: 08/21/2020 PCP: Patient, [name]  HPI/Recap of past 24 hours: HPI from Dr Joylene Igo Sylvia Wiggins is a 59 y.o. female with medical history significant for CAD status post stent angioplasty, nicotine dependence, history of hypertension, depression and dyslipidemia who presents to the ER for evaluation of mid sternal chest pain/pressure and upper back pain that woke her up from sleep on the morning of her admission.  She rated her pain a 10 x 10 in intensity at its worst and states that it was similar to the pain she had when she had a myocardial infarction.  Chest pain was associated with diaphoresis but she denies having any nausea, vomiting, shortness of breath, dizziness or lightheadedness.  Pain was nonradiating. Patient had some improvement in her pain with sublingual nitroglycerin initially but the pain recurred prompting her visit to the ER. With Nitropaste she is currently chest pain-free.  Patient was started on IV Heparin, labs fairly unremarkable.  Troponin mildly elevated, EKG with no acute ST changes.  Chest x-ray unremarkable.  Cardiology consulted.  Patient admitted for further management.    Today, patient denies any further chest pain, still with some upper back pain.  Denies any nausea/vomiting, palpitations, shortness of breath, abdominal pain, fever/chills.    Assessment/Plan: Principal Problem:   Unstable angina (HCC) Active Problems:   CAD (coronary artery disease)   Depression   HTN (hypertension)   Nicotine dependence   Possible NSTEMI History of CAD s/p stent angioplasty Last cardiac cath 02/2018 Troponin mildly elevated, EKG with no acute ST changes Echo pending Cardiology consulted-plan for cath Continue heparin drip Continue recently started metoprolol, Lipitor, add losartan  Hypertension BP uncontrolled Continue metoprolol, losartan  Hyperlipidemia LDL 153 Continue  Lipitor  Tobacco abuse Advised to quit Refuses nicotine patch        Malnutrition Type:      Malnutrition Characteristics:      Nutrition Interventions:       Estimated body mass index is 24.69 kg/m as calculated from the following:   Height as of this encounter: 5\' 2"  (1.575 m).   Weight as of this encounter: 61.2 kg.     Code Status: Full  Family Communication: Discussed with patient  Disposition Plan: Status is: Inpatient  Remains inpatient appropriate because:Inpatient level of care appropriate due to severity of illness   Dispo: The patient is from: Home              Anticipated d/c is to: Home              Anticipated d/c date is: 1 day              Patient currently is not medically stable to d/c.    Consultants:  Cardiology  Procedures:  None  Antimicrobials:  None  DVT prophylaxis: Heparin drip   Objective: Vitals:   08/21/20 2100 08/22/20 0411 08/22/20 0735 08/22/20 1215  BP: 126/84 (!) 143/90 131/85 (!) 158/88  Pulse: 62 (!) 57 (!) 53 (!) 55  Resp: 17  20 19   Temp: 97.7 F (36.5 C) 97.7 F (36.5 C) 98.5 F (36.9 C) 98.3 F (36.8 C)  TempSrc: Oral Oral Oral Oral  SpO2: 94% 98% 100% 100%  Weight:      Height:        Intake/Output Summary (Last 24 hours) at 08/22/2020 1302 Last data filed at 08/22/2020 0954 Gross per 24 hour  Intake 694.68 ml  Output 50 ml  Net 644.68 ml   Filed Weights   08/21/20 1027  Weight: 61.2 kg    Exam:  General: NAD   Cardiovascular: S1, S2 present  Respiratory: CTAB  Abdomen: Soft, nontender, nondistended, bowel sounds present  Musculoskeletal: No bilateral pedal edema noted  Skin: Normal  Psychiatry: Normal mood   Data Reviewed: CBC: Recent Labs  Lab 08/21/20 1057 08/22/20 0355  WBC 6.4 7.2  HGB 16.0* 14.8  HCT 45.7 42.1  MCV 97.9 97.5  PLT 214 193   Basic Metabolic Panel: Recent Labs  Lab 08/21/20 1057  NA 136  K 3.8  CL 104  CO2 24  GLUCOSE 143*  BUN  12  CREATININE 0.88  CALCIUM 9.7   GFR: Estimated Creatinine Clearance: 58.5 mL/min (by C-G formula based on SCr of 0.88 mg/dL). Liver Function Tests: No results for input(s): AST, ALT, ALKPHOS, BILITOT, PROT, ALBUMIN in the last 168 hours. No results for input(s): LIPASE, AMYLASE in the last 168 hours. No results for input(s): AMMONIA in the last 168 hours. Coagulation Profile: Recent Labs  Lab 08/21/20 1228  INR 0.9   Cardiac Enzymes: No results for input(s): CKTOTAL, CKMB, CKMBINDEX, TROPONINI in the last 168 hours. BNP (last 3 results) No results for input(s): PROBNP in the last 8760 hours. HbA1C: No results for input(s): HGBA1C in the last 72 hours. CBG: Recent Labs  Lab 08/22/20 0804 08/22/20 1214  GLUCAP 98 158*   Lipid Profile: Recent Labs    08/22/20 0355  CHOL 247*  HDL 43  LDLCALC 153*  TRIG 256*  CHOLHDL 5.7   Thyroid Function Tests: No results for input(s): TSH, T4TOTAL, FREET4, T3FREE, THYROIDAB in the last 72 hours. Anemia Panel: No results for input(s): VITAMINB12, FOLATE, FERRITIN, TIBC, IRON, RETICCTPCT in the last 72 hours. Urine analysis: No results found for: COLORURINE, APPEARANCEUR, LABSPEC, PHURINE, GLUCOSEU, HGBUR, BILIRUBINUR, KETONESUR, PROTEINUR, UROBILINOGEN, NITRITE, LEUKOCYTESUR Sepsis Labs: @LABRCNTIP (procalcitonin:4,lacticidven:4)  ) Recent Results (from the past 240 hour(s))  Respiratory Panel by RT PCR (Flu A&B, Covid) - Nasopharyngeal Swab     Status: None   Collection Time: 08/21/20 12:28 PM   Specimen: Nasopharyngeal Swab  Result Value Ref Range Status   SARS Coronavirus 2 by RT PCR NEGATIVE NEGATIVE Final    Comment: (NOTE) SARS-CoV-2 target nucleic acids are NOT DETECTED.  The SARS-CoV-2 RNA is generally detectable in upper respiratoy specimens during the acute phase of infection. The lowest concentration of SARS-CoV-2 viral copies this assay can detect is 131 copies/mL. A negative result does not preclude  SARS-Cov-2 infection and should not be used as the sole basis for treatment or other patient management decisions. A negative result may occur with  improper specimen collection/handling, submission of specimen other than nasopharyngeal swab, presence of viral mutation(s) within the areas targeted by this assay, and inadequate number of viral copies (<131 copies/mL). A negative result must be combined with clinical observations, patient history, and epidemiological information. The expected result is Negative.  Fact Sheet for Patients:  08/23/20  Fact Sheet for Healthcare Providers:  https://www.moore.com/  This test is no t yet approved or cleared by the https://www.young.biz/ FDA and  has been authorized for detection and/or diagnosis of SARS-CoV-2 by FDA under an Emergency Use Authorization (EUA). This EUA will remain  in effect (meaning this test can be used) for the duration of the COVID-19 declaration under Section 564(b)(1) of the Act, 21 U.S.C. section 360bbb-3(b)(1), unless the authorization is terminated or revoked sooner.  Influenza A by PCR NEGATIVE NEGATIVE Final   Influenza B by PCR NEGATIVE NEGATIVE Final    Comment: (NOTE) The Xpert Xpress SARS-CoV-2/FLU/RSV assay is intended as an aid in  the diagnosis of influenza from Nasopharyngeal swab specimens and  should not be used as a sole basis for treatment. Nasal washings and  aspirates are unacceptable for Xpert Xpress SARS-CoV-2/FLU/RSV  testing.  Fact Sheet for Patients: https://www.moore.com/  Fact Sheet for Healthcare Providers: https://www.young.biz/  This test is not yet approved or cleared by the [place] FDA and  has been authorized for detection and/or diagnosis of SARS-CoV-2 by  FDA under an Emergency Use Authorization (EUA). This EUA will remain  in effect (meaning this test can be used) for the duration of the   Covid-19 declaration under Section 564(b)(1) of the Act, 21  U.S.C. section 360bbb-3(b)(1), unless the authorization is  terminated or revoked. Performed at [facility], [address] [place], [place] [address]       Studies: No results found.  Scheduled Meds: . aspirin EC  81 mg Oral Daily  . atorvastatin  80 mg Oral Daily  . insulin aspart  0-5 Units Subcutaneous QHS  . insulin aspart  0-9 Units Subcutaneous TID WC  . metoprolol succinate  50 mg Oral Daily    Continuous Infusions: . heparin 1,050 Units/hr (08/22/20 0705)     LOS: 1 day     Briant Cedar, MD [facility]  If 7PM-7AM, please contact night-coverage [email or web address] 08/22/2020, 1:02 PM

## 2020-08-22 NOTE — Progress Notes (Signed)
ANTICOAGULATION CONSULT NOTE   Pharmacy Consult for Heparin Indication: chest pain/ACS  Allergies  Allergen Reactions  . Penicillins Nausea Only    Did it involve swelling of the face/tongue/throat, SOB, or low BP? No Did it involve sudden or severe rash/hives, skin peeling, or any reaction on the inside of your mouth or nose? No Did you need to seek medical attention at a hospital or doctor's office? No When did it last happen?1990 If all above answers are "NO", may proceed with cephalosporin use.     Patient Measurements: Height: 5\' 2"  (157.5 cm) Weight: 61.2 kg (135 lb) IBW/kg (Calculated) : 50.1 Heparin Dosing Weight: 61.2 kg  Vital Signs: Temp: 97.7 F (36.5 C) (11/14 0411) Temp Source: Oral (11/14 0411) BP: 143/90 (11/14 0411) Pulse Rate: 57 (11/14 0411)  Labs: Recent Labs    08/21/20 1057 08/21/20 1057 08/21/20 1228 08/21/20 1541 08/21/20 1755 08/21/20 2001 08/22/20 0355  HGB 16.0*  --   --   --   --   --  14.8  HCT 45.7  --   --   --   --   --  42.1  PLT 214  --   --   --   --   --  193  APTT  --   --  25  --   --   --   --   LABPROT  --   --  11.6  --   --   --   --   INR  --   --  0.9  --   --   --   --   HEPARINUNFRC  --   --   --   --   --  0.13* 0.21*  CREATININE 0.88  --   --   --   --   --   --   TROPONINIHS 67*   < > 76* 86* 90*  --   --    < > = values in this interval not displayed.    Estimated Creatinine Clearance: 58.5 mL/min (by C-G formula based on SCr of 0.88 mg/dL).   Medical History: Past Medical History:  Diagnosis Date  . Abscess, gluteal, left    Recurrent drainage, followed by [facility].  . Arthritis   . CAD (coronary artery disease)    Stents in Clogged artery at [facility] June 2014  . Depression   . Elevated lipids   . Hyperlipidemia   . Hypertension   . Myocardial infarction [facility]) June 2014    Assessment: 59 yo female here with upper back pain that feels the same as her previous MI. No oral anticoagulants noted on PTA  med list (Med rec not yet complete).   11/14 @ 0355 HL 0.21, SUBtherapeutic despite rate increase.  Goal of Therapy:  Heparin level 0.3-0.7 units/ml Monitor platelets by anticoagulation protocol: Yes   Plan:  Heparin bolus 2000 units IV x1 then heparin infusion at 1050 units/hr - nursing aware of changes. Heparin level in 6h after increase, CBC in AM  Pharmacy will continue to follow  12/14 A 08/22/2020,4:26 AM

## 2020-08-22 NOTE — Progress Notes (Signed)
ANTICOAGULATION CONSULT NOTE   Pharmacy Consult for Heparin Indication: chest pain/ACS  Allergies  Allergen Reactions  . Penicillins Nausea Only    Did it involve swelling of the face/tongue/throat, SOB, or low BP? No Did it involve sudden or severe rash/hives, skin peeling, or any reaction on the inside of your mouth or nose? No Did you need to seek medical attention at a hospital or doctor's office? No When did it last happen?1990 If all above answers are "NO", may proceed with cephalosporin use.     Patient Measurements: Height: 5\' 2"  (157.5 cm) Weight: 61.2 kg (135 lb) IBW/kg (Calculated) : 50.1 Heparin Dosing Weight: 61.2 kg  Vital Signs: Temp: 97.6 F (36.4 C) (11/14 1638) Temp Source: Oral (11/14 1638) BP: 136/84 (11/14 1638) Pulse Rate: 49 (11/14 1638)  Labs: Recent Labs    08/21/20 1057 08/21/20 1057 08/21/20 1228 08/21/20 1541 08/21/20 1755 08/21/20 2001 08/22/20 0355 08/22/20 1049  HGB 16.0*  --   --   --   --   --  14.8  --   HCT 45.7  --   --   --   --   --  42.1  --   PLT 214  --   --   --   --   --  193  --   APTT  --   --  25  --   --   --   --   --   LABPROT  --   --  11.6  --   --   --   --   --   INR  --   --  0.9  --   --   --   --   --   HEPARINUNFRC  --   --   --   --   --  0.13* 0.21* 0.52  CREATININE 0.88  --   --   --   --   --   --   --   TROPONINIHS 67*   < > 76* 86* 90*  --   --   --    < > = values in this interval not displayed.    Estimated Creatinine Clearance: 58.5 mL/min (by C-G formula based on SCr of 0.88 mg/dL).   Medical History: Past Medical History:  Diagnosis Date  . Abscess, gluteal, left    Recurrent drainage, followed by [facility].  . Arthritis   . CAD (coronary artery disease)    Stents in Clogged artery at [facility] June 2014  . Depression   . Elevated lipids   . Hyperlipidemia   . Hypertension   . Myocardial infarction [facility]) June 2014    Assessment: 59 yo female here with upper back pain that feels the  same as her previous MI. No oral anticoagulants noted on PTA med list (Med rec not yet complete).   11/14 @ 0355 HL 0.21, SUBtherapeutic despite rate increase.Heparin bolus 2000 units IV x1 then heparin infusion at 1050 units/hr   Goal of Therapy:  Heparin level 0.3-0.7 units/ml Monitor platelets by anticoagulation protocol: Yes   Plan:  11/14 HL @ 1838 = 0.50.  Therapeutic x2. Will continue heparin infusion at 1050 units/hr . Check confirmatory Heparin level at 0500 and CBC.   Pharmacy will continue to follow  Landon Bassford R Lilyrose Tanney 08/22/2020,5:25 PM

## 2020-08-22 NOTE — Progress Notes (Signed)
ANTICOAGULATION CONSULT NOTE   Pharmacy Consult for Heparin Indication: chest pain/ACS  Allergies  Allergen Reactions  . Penicillins Nausea Only    Did it involve swelling of the face/tongue/throat, SOB, or low BP? No Did it involve sudden or severe rash/hives, skin peeling, or any reaction on the inside of your mouth or nose? No Did you need to seek medical attention at a hospital or doctor's office? No When did it last happen?1990 If all above answers are "NO", may proceed with cephalosporin use.     Patient Measurements: Height: 5\' 2"  (157.5 cm) Weight: 61.2 kg (135 lb) IBW/kg (Calculated) : 50.1 Heparin Dosing Weight: 61.2 kg  Vital Signs: Temp: 98.3 F (36.8 C) (11/14 1215) Temp Source: Oral (11/14 1215) BP: 158/88 (11/14 1215) Pulse Rate: 55 (11/14 1215)  Labs: Recent Labs    08/21/20 1057 08/21/20 1057 08/21/20 1228 08/21/20 1541 08/21/20 1755 08/21/20 2001 08/22/20 0355 08/22/20 1049  HGB 16.0*  --   --   --   --   --  14.8  --   HCT 45.7  --   --   --   --   --  42.1  --   PLT 214  --   --   --   --   --  193  --   APTT  --   --  25  --   --   --   --   --   LABPROT  --   --  11.6  --   --   --   --   --   INR  --   --  0.9  --   --   --   --   --   HEPARINUNFRC  --   --   --   --   --  0.13* 0.21* 0.52  CREATININE 0.88  --   --   --   --   --   --   --   TROPONINIHS 67*   < > 76* 86* 90*  --   --   --    < > = values in this interval not displayed.    Estimated Creatinine Clearance: 58.5 mL/min (by C-G formula based on SCr of 0.88 mg/dL).   Medical History: Past Medical History:  Diagnosis Date  . Abscess, gluteal, left    Recurrent drainage, followed by [facility].  . Arthritis   . CAD (coronary artery disease)    Stents in Clogged artery at [facility] June 2014  . Depression   . Elevated lipids   . Hyperlipidemia   . Hypertension   . Myocardial infarction [facility]) June 2014    Assessment: 59 yo female here with upper back pain that feels the  same as her previous MI. No oral anticoagulants noted on PTA med list (Med rec not yet complete).   11/14 @ 0355 HL 0.21, SUBtherapeutic despite rate increase.Heparin bolus 2000 units IV x1 then heparin infusion at 1050 units/hr   Goal of Therapy:  Heparin level 0.3-0.7 units/ml Monitor platelets by anticoagulation protocol: Yes   Plan:  11/14 HL@1049 = 0.52.  Therapeutic. Will continue heparin infusion at 1050 units/hr . Check confirmatory Heparin level in~ 6h. CBC in AM  Pharmacy will continue to follow  Jennifr Gaeta A 08/22/2020,12:17 PM

## 2020-08-22 NOTE — Progress Notes (Addendum)
Pt is complaining of a back pain 7/10 and states tramadol is what pt takes that helps with pain. Notify NP Ouma. Will continue to monitor.  Update 0511: Ouma NP ordered tramadol 50 mg oral once. Will continue to monitor.

## 2020-08-22 NOTE — Consult Note (Signed)
CARDIOLOGY CONSULT NOTE               Patient ID: ARRIN ISHLER MRN: [id number] DOB/AGE: [birth year] 59 y.o.  Admit date: 08/21/2020 Referring Physician Dr Arvid Right hospitalist Primary Physician needs to have a consultation of unexpected obesity Primary Cardiologist Dr Gwen Pounds Reason for Consultation [place]  HPI: Patient is a 59 year old female presents with unstable anginal symptoms patient has known coronary disease including PCI and stenting past started having substernal chest discomfort and radiating to her back where she normally has anginal symptoms she states they have had PCI and stent in 2014 and 2017 has done reasonably well but now with the worsening anginal symptoms came to the emergency room she had diaphoresis and shortness of breath with the episode and occurred while at rest.  Review of systems complete and found to be negative unless listed above     Past Medical History:  Diagnosis Date  . Abscess, gluteal, left    Recurrent drainage, followed by [facility].  . Arthritis   . CAD (coronary artery disease)    Stents in Clogged artery at [facility] June 2014  . Depression   . Elevated lipids   . Hyperlipidemia   . Hypertension   . Myocardial infarction [facility]) June 2014    Past Surgical History:  Procedure Laterality Date  . CARDIAC CATHETERIZATION N/A 08/30/2016   Procedure: Left Heart Cath and Coronary Angiography;  Surgeon: Marcina Millard, MD;  Location: [facility] INVASIVE CV LAB;  Service: Cardiovascular;  Laterality: N/A;  . CARDIAC SURGERY  03/30/2013   stent placement  . CORONARY ANGIOPLASTY     cardiac stents x 2  . INGUINAL HERNIA REPAIR Right 02/26/2020   Procedure: HERNIA REPAIR INGUINAL ADULT;  Surgeon: Sung Amabile, DO;  Location: [facility] ORS;  Service: General;  Laterality: Right;  . LEFT HEART CATH AND CORS/GRAFTS ANGIOGRAPHY N/A 02/18/2018   Procedure: LEFT HEART CATH AND CORS/GRAFTS ANGIOGRAPHY;  Surgeon: Dalia Heading, MD;  Location: [facility] INVASIVE CV  LAB;  Service: Cardiovascular;  Laterality: N/A;  . RECTAL SURGERY  Anal Fissure  . XI ROBOTIC ASSISTED INGUINAL HERNIA REPAIR WITH MESH Right 05/16/2019   Procedure: XI ROBOTIC ASSISTED Right INGUINAL HERNIA REPAIR WITH MESH;  Surgeon: Sung Amabile, DO;  Location: [facility] ORS;  Service: General;  Laterality: Right;    No medications prior to admission.   Social History   Socioeconomic History  . Marital status: Legally Separated    Spouse name: Not on file  . Number of children: Not on file  . Years of education: Not on file  . Highest education level: Not on file  Occupational History  . Not on file  Tobacco Use  . Smoking status: Current Every Day Smoker    Packs/day: 0.25    Types: Cigarettes  . Smokeless tobacco: Never Used  Vaping Use  . Vaping Use: Never used  Substance and Sexual Activity  . Alcohol use: No    Alcohol/week: 0.0 standard drinks  . Drug use: No  . Sexual activity: Never  Other Topics Concern  . Not on file  Social History Narrative  . Not on file   Social Determinants of Health   Financial Resource Strain:   . Difficulty of Paying Living Expenses: Not on file  Food Insecurity:   . Worried About Programme researcher, broadcasting/film/video in the Last Year: Not on file  . Ran Out of Food in the Last Year: Not on file  Transportation Needs:   . Lack of  Transportation (Medical): Not on file  . Lack of Transportation (Non-Medical): Not on file  Physical Activity:   . Days of Exercise per Week: Not on file  . Minutes of Exercise per Session: Not on file  Stress:   . Feeling of Stress : Not on file  Social Connections:   . Frequency of Communication with Friends and Family: Not on file  . Frequency of Social Gatherings with Friends and Family: Not on file  . Attends Religious Services: Not on file  . Active Member of Clubs or Organizations: Not on file  . Attends Banker Meetings: Not on file  . Marital Status: Not on file  Intimate Partner Violence:   . Fear  of Current or Ex-Partner: Not on file  . Emotionally Abused: Not on file  . Physically Abused: Not on file  . Sexually Abused: Not on file    Family History  Problem Relation Age of Onset  . Cancer Mother        colon  . Heart disease Father        Died of Heart Attack      Review of systems complete and found to be negative unless listed above      PHYSICAL EXAM  General: Well developed, well nourished, in no acute distress HEENT:  Normocephalic and atramatic Neck:  No JVD.  Lungs: Clear bilaterally to auscultation and percussion. Heart: HRRR . Normal S1 and S2 without gallops or murmurs.  Abdomen: Bowel sounds are positive, abdomen soft and non-tender  Msk:  Back normal, normal gait. Normal strength and tone for age. Extremities: No clubbing, cyanosis or edema.   Neuro: Alert and oriented X 3. Psych:  Good affect, responds appropriately  Labs:   Lab Results  Component Value Date   WBC 7.2 08/22/2020   HGB 14.8 08/22/2020   HCT 42.1 08/22/2020   MCV 97.5 08/22/2020   PLT 193 08/22/2020    Recent Labs  Lab 08/21/20 1057  NA 136  K 3.8  CL 104  CO2 24  BUN 12  CREATININE 0.88  CALCIUM 9.7  GLUCOSE 143*   Lab Results  Component Value Date   CKTOTAL 102 03/29/2013   CKMB 2.9 03/29/2013   TROPONINI 0.83 (HH) 02/17/2018    Lab Results  Component Value Date   CHOL 247 (H) 08/22/2020   CHOL 190 08/30/2016   CHOL 279 (H) 01/26/2016   Lab Results  Component Value Date   HDL 43 08/22/2020   HDL 33 (L) 08/30/2016   HDL 39 (L) 01/26/2016   Lab Results  Component Value Date   LDLCALC 153 (H) 08/22/2020   LDLCALC UNABLE TO CALCULATE IF TRIGLYCERIDE OVER 400 mg/dL 83/41/9622   LDLCALC 297 (H) 01/26/2016   Lab Results  Component Value Date   TRIG 256 (H) 08/22/2020   TRIG 928 (H) 08/30/2016   TRIG 296 (H) 01/26/2016   Lab Results  Component Value Date   CHOLHDL 5.7 08/22/2020   CHOLHDL 5.8 08/30/2016   CHOLHDL 7.2 (H) 01/26/2016   No results  found for: LDLDIRECT    Radiology: DG Chest 2 View  Result Date: 08/21/2020 CLINICAL DATA:  Chest pain EXAM: CHEST - 2 VIEW COMPARISON:  December 03, 2019 FINDINGS: Lungs are clear. Heart size and pulmonary vascularity are normal. No adenopathy. Apparent stent in the left anterior descending coronary artery. No pneumothorax. No bone lesions. IMPRESSION: Apparent coronary artery stent.  Heart size normal.  Lungs clear. Electronically Signed   By:  Bretta Bang III M.D.   On: 08/21/2020 11:10    EKG: Normal sinus rhythm nonspecific T2 changes rate of 55 acute care discharge  ASSESSMENT AND PLAN:  Unstable angina Known coronary artery disease Smoking Hypertension Hyperlipidemia Previous PCI and stent x2 . Plan Agree with admit rule out microinfarction follow-up EKGs and troponins Recommend anticoagulation with heparin aspirin Echocardiogram will be helpful for assessment of left ventricular function wall motion Agree with nitrates for anginal symptoms Continue beta-blockers and ARB for blood pressure management and control Recommend statin therapy with Lipitor or Crestor Recommend cardiac cath within 24 hours prior to discharge   Signed: Alwyn Pea MD 08/22/2020, 11:31 AM

## 2020-08-22 NOTE — Progress Notes (Signed)
*  PRELIMINARY RESULTS* Echocardiogram 2D Echocardiogram has been performed.  Sylvia Wiggins 08/22/2020, 7:12 PM

## 2020-08-23 ENCOUNTER — Encounter: Payer: Self-pay | Admitting: Anesthesiology

## 2020-08-23 ENCOUNTER — Encounter
Admission: EM | Disposition: A | Discharge status: Home / Self Care | Payer: Self-pay | Source: Home / Self Care | Attending: Internal Medicine

## 2020-08-23 HISTORY — PX: LEFT HEART CATH AND CORONARY ANGIOGRAPHY: CATH118249

## 2020-08-23 HISTORY — PX: CORONARY STENT INTERVENTION: CATH118234

## 2020-08-23 LAB — BASIC METABOLIC PANEL
Anion gap: 10 (ref 5–15)
BUN: 13 mg/dL (ref 6–20)
CO2: 21 mmol/L — ABNORMAL LOW (ref 22–32)
Calcium: 9.1 mg/dL (ref 8.9–10.3)
Chloride: 106 mmol/L (ref 98–111)
Creatinine, Ser: 0.76 mg/dL (ref 0.44–1.00)
GFR, Estimated: >60 mL/min (ref >60)
Glucose, Bld: 97 mg/dL (ref 70–99)
Potassium: 4 mmol/L (ref 3.5–5.1)
Sodium: 137 mmol/L (ref 135–145)

## 2020-08-23 LAB — CBC
HCT: 44.1 % (ref 36.0–46.0)
Hemoglobin: 15.4 g/dL — ABNORMAL HIGH (ref 12.0–15.0)
MCH: 34.3 pg — ABNORMAL HIGH (ref 26.0–34.0)
MCHC: 34.9 g/dL (ref 30.0–36.0)
MCV: 98.2 fL (ref 80.0–100.0)
Platelets: 186 10*3/uL (ref 150–400)
RBC: 4.49 MIL/uL (ref 3.87–5.11)
RDW: 12 % (ref 11.5–15.5)
WBC: 6.2 10*3/uL (ref 4.0–10.5)
nRBC: 0 % (ref 0.0–0.2)

## 2020-08-23 LAB — GLUCOSE, CAPILLARY
Glucose-Capillary: 94 mg/dL (ref 70–99)
Glucose-Capillary: 194 mg/dL — ABNORMAL HIGH (ref 70–99)
Glucose-Capillary: 101 mg/dL — ABNORMAL HIGH (ref 70–99)

## 2020-08-23 LAB — HEPARIN LEVEL (UNFRACTIONATED): Heparin Unfractionated: 0.43 IU/mL (ref 0.30–0.70)

## 2020-08-23 LAB — POCT ACTIVATED CLOTTING TIME: Activated Clotting Time: 312 seconds

## 2020-08-23 SURGERY — LEFT HEART CATH AND CORONARY ANGIOGRAPHY
Anesthesia: Moderate Sedation

## 2020-08-23 MED ORDER — SODIUM CHLORIDE 0.9% FLUSH: 3.0000 mL | INTRAVENOUS | Status: DC | PRN

## 2020-08-23 MED ORDER — LABETALOL HCL 5 MG/ML IV SOLN: 10.0000 mg | INTRAVENOUS | Status: DC | PRN

## 2020-08-23 MED ORDER — MIDAZOLAM HCL 2 MG/2ML IJ SOLN
INTRAMUSCULAR | Status: AC
  Filled 2020-08-23: qty 2

## 2020-08-23 MED ORDER — METOPROLOL SUCCINATE ER 25 MG PO TB24: 25.0000 mg | ORAL_TABLET | Freq: Every day | ORAL | Status: DC

## 2020-08-23 MED ORDER — HEPARIN (PORCINE) IN NACL 1000-0.9 UT/500ML-% IV SOLN
INTRAVENOUS | Status: AC
  Filled 2020-08-23: qty 1000

## 2020-08-23 MED ORDER — CLOPIDOGREL BISULFATE 75 MG PO TABS
ORAL_TABLET | ORAL | Status: AC
  Filled 2020-08-23: qty 8

## 2020-08-23 MED ORDER — ASPIRIN 81 MG PO CHEW: 81.0000 mg | CHEWABLE_TABLET | Freq: Every day | ORAL | Status: DC

## 2020-08-23 MED ORDER — MELATONIN 5 MG PO TABS
5.0000 mg | ORAL_TABLET | Freq: Every evening | ORAL | Status: DC | PRN
  Administered 2020-08-23: 5 mg via ORAL
  Filled 2020-08-23: qty 1

## 2020-08-23 MED ORDER — METOPROLOL TARTRATE 5 MG/5ML IV SOLN
INTRAVENOUS | Status: AC
  Filled 2020-08-23: qty 5

## 2020-08-23 MED ORDER — TRAMADOL HCL 50 MG PO TABS
50.0000 mg | ORAL_TABLET | Freq: Four times a day (QID) | ORAL | Status: DC | PRN
  Administered 2020-08-23: 50 mg via ORAL
  Filled 2020-08-23: qty 1

## 2020-08-23 MED ORDER — ASPIRIN 81 MG PO CHEW
81.0000 mg | CHEWABLE_TABLET | ORAL | Status: AC
  Administered 2020-08-23: 81 mg via ORAL
  Filled 2020-08-23: qty 1

## 2020-08-23 MED ORDER — SODIUM CHLORIDE 0.9 % IV SOLN
INTRAVENOUS | Status: AC | PRN
  Administered 2020-08-23: 1.75 mg/kg/h via INTRAVENOUS

## 2020-08-23 MED ORDER — ENOXAPARIN SODIUM 40 MG/0.4ML ~~LOC~~ SOLN
40.0000 mg | SUBCUTANEOUS | Status: DC
  Administered 2020-08-23: 40 mg via SUBCUTANEOUS
  Filled 2020-08-23: qty 0.4

## 2020-08-23 MED ORDER — IOHEXOL 300 MG/ML  SOLN
INTRAMUSCULAR | Status: DC | PRN
  Administered 2020-08-23: 190 mL

## 2020-08-23 MED ORDER — SODIUM CHLORIDE 0.9 % WEIGHT BASED INFUSION
3.0000 mL/kg/h | INTRAVENOUS | Status: AC
  Administered 2020-08-23: 3 mL/kg/h via INTRAVENOUS

## 2020-08-23 MED ORDER — NITROGLYCERIN 1 MG/10 ML FOR IR/CATH LAB
INTRA_ARTERIAL | Status: DC | PRN
  Administered 2020-08-23: 200 ug

## 2020-08-23 MED ORDER — ASPIRIN 81 MG PO CHEW
CHEWABLE_TABLET | ORAL | Status: AC
  Filled 2020-08-23: qty 3

## 2020-08-23 MED ORDER — SODIUM CHLORIDE 0.9 % IV SOLN
0.2500 mg/kg/h | INTRAVENOUS | Status: AC
  Filled 2020-08-23: qty 250

## 2020-08-23 MED ORDER — NITROGLYCERIN 1 MG/10 ML FOR IR/CATH LAB
INTRA_ARTERIAL | Status: AC
  Filled 2020-08-23: qty 10

## 2020-08-23 MED ORDER — SODIUM CHLORIDE 0.9 % IV SOLN: 250.0000 mL | INTRAVENOUS | Status: DC | PRN

## 2020-08-23 MED ORDER — METOPROLOL TARTRATE 5 MG/5ML IV SOLN
INTRAVENOUS | Status: DC | PRN
  Administered 2020-08-23 (×2): 2.5 mg via INTRAVENOUS

## 2020-08-23 MED ORDER — SODIUM CHLORIDE 0.9 % WEIGHT BASED INFUSION: 3.0000 mL/kg/h | INTRAVENOUS | Status: DC

## 2020-08-23 MED ORDER — HYDRALAZINE HCL 20 MG/ML IJ SOLN: 10.0000 mg | INTRAMUSCULAR | Status: AC | PRN

## 2020-08-23 MED ORDER — FENTANYL CITRATE (PF) 100 MCG/2ML IJ SOLN
INTRAMUSCULAR | Status: AC
  Filled 2020-08-23: qty 2

## 2020-08-23 MED ORDER — ADENOSINE (DIAGNOSTIC) FOR INTRACORONARY USE
INTRAVENOUS | Status: DC | PRN
  Administered 2020-08-23: 72 ug via INTRACORONARY

## 2020-08-23 MED ORDER — MIDAZOLAM HCL 2 MG/2ML IJ SOLN
INTRAMUSCULAR | Status: DC | PRN
  Administered 2020-08-23: 1 mg via INTRAVENOUS

## 2020-08-23 MED ORDER — ASPIRIN 81 MG PO CHEW: 81.0000 mg | CHEWABLE_TABLET | ORAL | Status: DC

## 2020-08-23 MED ORDER — SODIUM CHLORIDE 0.9 % WEIGHT BASED INFUSION
1.0000 mL/kg/h | INTRAVENOUS | Status: DC
  Administered 2020-08-23: 1 mL/kg/h via INTRAVENOUS

## 2020-08-23 MED ORDER — ASPIRIN 81 MG PO CHEW
CHEWABLE_TABLET | ORAL | Status: DC | PRN
  Administered 2020-08-23: 243 mg via ORAL

## 2020-08-23 MED ORDER — SODIUM CHLORIDE 0.9 % WEIGHT BASED INFUSION: 1.0000 mL/kg/h | INTRAVENOUS | Status: AC

## 2020-08-23 MED ORDER — SODIUM CHLORIDE 0.9 % WEIGHT BASED INFUSION: 1.0000 mL/kg/h | INTRAVENOUS | Status: DC

## 2020-08-23 MED ORDER — BIVALIRUDIN TRIFLUOROACETATE 250 MG IV SOLR
INTRAVENOUS | Status: AC
  Filled 2020-08-23: qty 250

## 2020-08-23 MED ORDER — LIDOCAINE HCL (PF) 1 % IJ SOLN
INTRAMUSCULAR | Status: AC
  Filled 2020-08-23: qty 30

## 2020-08-23 MED ORDER — CLOPIDOGREL BISULFATE 75 MG PO TABS
ORAL_TABLET | ORAL | Status: DC | PRN
  Administered 2020-08-23: 600 mg via ORAL

## 2020-08-23 MED ORDER — ONDANSETRON HCL 4 MG/2ML IJ SOLN: 4.0000 mg | Freq: Four times a day (QID) | INTRAMUSCULAR | Status: DC | PRN

## 2020-08-23 MED ORDER — ADENOSINE 6 MG/2ML IV SOLN
INTRAVENOUS | Status: AC
  Filled 2020-08-23: qty 2

## 2020-08-23 MED ORDER — BIVALIRUDIN BOLUS VIA INFUSION - CUPID
INTRAVENOUS | Status: DC | PRN
  Administered 2020-08-23: 47.55 mg via INTRAVENOUS

## 2020-08-23 MED ORDER — HEPARIN (PORCINE) IN NACL 2000-0.9 UNIT/L-% IV SOLN
INTRAVENOUS | Status: DC | PRN
  Administered 2020-08-23: 500 mL

## 2020-08-23 MED ORDER — ACETAMINOPHEN 325 MG PO TABS: 650.0000 mg | ORAL_TABLET | ORAL | Status: DC | PRN

## 2020-08-23 MED ORDER — FENTANYL CITRATE (PF) 100 MCG/2ML IJ SOLN
INTRAMUSCULAR | Status: DC | PRN
  Administered 2020-08-23 (×2): 25 ug via INTRAVENOUS

## 2020-08-23 MED ORDER — ISOSORBIDE MONONITRATE ER 30 MG PO TB24
30.0000 mg | ORAL_TABLET | Freq: Every day | ORAL | Status: DC
  Administered 2020-08-23 – 2020-08-24 (×2): 30 mg via ORAL
  Filled 2020-08-23 (×2): qty 1

## 2020-08-23 MED ORDER — SODIUM CHLORIDE 0.9% FLUSH
3.0000 mL | Freq: Two times a day (BID) | INTRAVENOUS | Status: DC
  Administered 2020-08-24: 3 mL via INTRAVENOUS

## 2020-08-23 MED ORDER — CLOPIDOGREL BISULFATE 75 MG PO TABS
75.0000 mg | ORAL_TABLET | Freq: Every day | ORAL | Status: DC
  Administered 2020-08-24: 75 mg via ORAL
  Filled 2020-08-23: qty 1

## 2020-08-23 SURGICAL SUPPLY — 15 items
BALLN TREK RX 2.5X20 (BALLOONS) ×3
BALLOON TREK RX 2.5X20 (BALLOONS) ×1 IMPLANT
CATH INFINITI 5FR JL4 (CATHETERS) ×3 IMPLANT
CATH INFINITI JR4 5F (CATHETERS) ×3 IMPLANT
CATH VISTA GUIDE 6FR JR4 SH (CATHETERS) ×3 IMPLANT
DEVICE CLOSURE MYNXGRIP 6/7F (Vascular Products) ×3 IMPLANT
KIT ENCORE 26 ADVANTAGE (KITS) ×3 IMPLANT
KIT MANI 3VAL PERCEP (MISCELLANEOUS) ×3 IMPLANT
NEEDLE PERC 18GX7CM (NEEDLE) ×3 IMPLANT
PACK CARDIAC CATH (CUSTOM PROCEDURE TRAY) ×3 IMPLANT
SHEATH AVANTI 5FR X 11CM (SHEATH) ×3 IMPLANT
SHEATH AVANTI 6FR X 11CM (SHEATH) ×3 IMPLANT
STENT RESOLUTE ONYX 3.5X22 (Permanent Stent) ×3 IMPLANT
WIRE G HI TQ BMW 190 (WIRE) ×3 IMPLANT
WIRE GUIDERIGHT .035X150 (WIRE) ×3 IMPLANT

## 2020-08-23 NOTE — Plan of Care (Signed)

## 2020-08-23 NOTE — Progress Notes (Signed)
PROGRESS NOTE  Sylvia Wiggins XBM:[id number] DOB: [birth year] DOA: 08/21/2020 PCP: Patient, [name]  HPI/Recap of past 24 hours: HPI from Dr Joylene Igo Sylvia Wiggins is a 59 y.o. female with medical history significant for CAD status post stent angioplasty, nicotine dependence, history of hypertension, depression and dyslipidemia who presents to the ER for evaluation of mid sternal chest pain/pressure and upper back pain that woke her up from sleep on the morning of her admission.  She rated her pain a 10 x 10 in intensity at its worst and states that it was similar to the pain she had when she had a myocardial infarction.  Chest pain was associated with diaphoresis but she denies having any nausea, vomiting, shortness of breath, dizziness or lightheadedness.  Pain was nonradiating. Patient had some improvement in her pain with sublingual nitroglycerin initially but the pain recurred prompting her visit to the ER. With Nitropaste she is currently chest pain-free.  Patient was started on IV Heparin, labs fairly unremarkable.  Troponin mildly elevated, EKG with no acute ST changes.  Chest x-ray unremarkable.  Cardiology consulted.  Patient admitted for further management.     Today, saw patient prior to cardiac catheterization, denied any new complaints, denies any chest pain, diaphoresis, nausea/vomiting, abdominal pain, fever/chills.    Assessment/Plan: Principal Problem:   Unstable angina (HCC) Active Problems:   CAD (coronary artery disease)   Depression   HTN (hypertension)   Nicotine dependence   Unstable angina/NSTEMI History of CAD s/p stent angioplasty Last cardiac cath 02/2018 Troponin mildly elevated, EKG with no acute ST changes Echo pending Cardiology consulted-cath done on 08/23/2020 showed multivessel CAD, 95% proximal to mid RCA, 75% mid circumflex, patent long LAD and circumflex stent.  PCI and stent to proximal to mid RCA with DES Continue recently started Plavix,  aspirin, Angiomax Continue recently started metoprolol, Lipitor, losartan, Imdur  Hypertension BP uncontrolled Continue metoprolol, losartan, Imdur  Hyperlipidemia LDL 153 Continue Lipitor  Tobacco abuse Advised to quit Refuses nicotine patch        Malnutrition Type:      Malnutrition Characteristics:      Nutrition Interventions:       Estimated body mass index is 25.57 kg/m as calculated from the following:   Height as of this encounter: 5\' 2"  (1.575 m).   Weight as of this encounter: 63.4 kg.     Code Status: Full  Family Communication: Discussed with patient  Disposition Plan: Status is: Inpatient  Remains inpatient appropriate because:Inpatient level of care appropriate due to severity of illness   Dispo: The patient is from: Home              Anticipated d/c is to: Home              Anticipated d/c date is: 1 day              Patient currently is not medically stable to d/c.    Consultants:  Cardiology  Procedures:  Cardiac catheterization on 08/23/2020  Antimicrobials:  None  DVT prophylaxis: Lovenox   Objective: Vitals:   08/23/20 1430 08/23/20 1514 08/23/20 1543 08/23/20 1546  BP: (!) 151/82 (!) 139/101  (!) 146/99  Pulse: 64  73 77  Resp: 16 13  20   Temp:    97.8 F (36.6 C)  TempSrc:    Oral  SpO2: 98% 100% 100% 100%  Weight:      Height:        Intake/Output Summary (  Last 24 hours) at 08/23/2020 1646 Last data filed at 08/23/2020 1554 Gross per 24 hour  Intake 907.59 ml  Output 1801 ml  Net -893.41 ml   Filed Weights   08/21/20 1027 08/23/20 0510  Weight: 61.2 kg 63.4 kg    Exam:  General: NAD   Cardiovascular: S1, S2 present  Respiratory: CTAB  Abdomen: Soft, nontender, nondistended, bowel sounds present  Musculoskeletal: No bilateral pedal edema noted  Skin: Normal  Psychiatry: Normal mood   Data Reviewed: CBC: Recent Labs  Lab 08/21/20 1057 08/22/20 0355 08/23/20 0558  WBC 6.4  7.2 6.2  HGB 16.0* 14.8 15.4*  HCT 45.7 42.1 44.1  MCV 97.9 97.5 98.2  PLT 214 193 186   Basic Metabolic Panel: Recent Labs  Lab 08/21/20 1057 08/23/20 0558  NA 136 137  K 3.8 4.0  CL 104 106  CO2 24 21*  GLUCOSE 143* 97  BUN 12 13  CREATININE 0.88 0.76  CALCIUM 9.7 9.1   GFR: Estimated Creatinine Clearance: 65.4 mL/min (by C-G formula based on SCr of 0.76 mg/dL). Liver Function Tests: No results for input(s): AST, ALT, ALKPHOS, BILITOT, PROT, ALBUMIN in the last 168 hours. No results for input(s): LIPASE, AMYLASE in the last 168 hours. No results for input(s): AMMONIA in the last 168 hours. Coagulation Profile: Recent Labs  Lab 08/21/20 1228  INR 0.9   Cardiac Enzymes: No results for input(s): CKTOTAL, CKMB, CKMBINDEX, TROPONINI in the last 168 hours. BNP (last 3 results) No results for input(s): PROBNP in the last 8760 hours. HbA1C: Recent Labs    08/22/20 0355  HGBA1C 5.6   CBG: Recent Labs  Lab 08/22/20 0804 08/22/20 1214 08/22/20 1639 08/22/20 2122 08/23/20 0737  GLUCAP 98 158* 95 146* 94   Lipid Profile: Recent Labs    08/22/20 0355  CHOL 247*  HDL 43  LDLCALC 153*  TRIG 256*  CHOLHDL 5.7   Thyroid Function Tests: No results for input(s): TSH, T4TOTAL, FREET4, T3FREE, THYROIDAB in the last 72 hours. Anemia Panel: No results for input(s): VITAMINB12, FOLATE, FERRITIN, TIBC, IRON, RETICCTPCT in the last 72 hours. Urine analysis: No results found for: COLORURINE, APPEARANCEUR, LABSPEC, PHURINE, GLUCOSEU, HGBUR, BILIRUBINUR, KETONESUR, PROTEINUR, UROBILINOGEN, NITRITE, LEUKOCYTESUR Sepsis Labs: @LABRCNTIP (procalcitonin:4,lacticidven:4)  ) Recent Results (from the past 240 hour(s))  Respiratory Panel by RT PCR (Flu A&B, Covid) - Nasopharyngeal Swab     Status: None   Collection Time: 08/21/20 12:28 PM   Specimen: Nasopharyngeal Swab  Result Value Ref Range Status   SARS Coronavirus 2 by RT PCR NEGATIVE NEGATIVE Final    Comment:  (NOTE) SARS-CoV-2 target nucleic acids are NOT DETECTED.  The SARS-CoV-2 RNA is generally detectable in upper respiratoy specimens during the acute phase of infection. The lowest concentration of SARS-CoV-2 viral copies this assay can detect is 131 copies/mL. A negative result does not preclude SARS-Cov-2 infection and should not be used as the sole basis for treatment or other patient management decisions. A negative result may occur with  improper specimen collection/handling, submission of specimen other than nasopharyngeal swab, presence of viral mutation(s) within the areas targeted by this assay, and inadequate number of viral copies (<131 copies/mL). A negative result must be combined with clinical observations, patient history, and epidemiological information. The expected result is Negative.  Fact Sheet for Patients:  08/23/20  Fact Sheet for Healthcare Providers:  https://www.moore.com/  This test is no t yet approved or cleared by the https://www.young.biz/ FDA and  has been authorized for detection  and/or diagnosis of SARS-CoV-2 by FDA under an Emergency Use Authorization (EUA). This EUA will remain  in effect (meaning this test can be used) for the duration of the COVID-19 declaration under Section 564(b)(1) of the Act, 21 U.S.C. section 360bbb-3(b)(1), unless the authorization is terminated or revoked sooner.     Influenza A by PCR NEGATIVE NEGATIVE Final   Influenza B by PCR NEGATIVE NEGATIVE Final    Comment: (NOTE) The Xpert Xpress SARS-CoV-2/FLU/RSV assay is intended as an aid in  the diagnosis of influenza from Nasopharyngeal swab specimens and  should not be used as a sole basis for treatment. Nasal washings and  aspirates are unacceptable for Xpert Xpress SARS-CoV-2/FLU/RSV  testing.  Fact Sheet for Patients: https://www.moore.com/  Fact Sheet for Healthcare  Providers: https://www.young.biz/  This test is not yet approved or cleared by the [place] FDA and  has been authorized for detection and/or diagnosis of SARS-CoV-2 by  FDA under an Emergency Use Authorization (EUA). This EUA will remain  in effect (meaning this test can be used) for the duration of the  Covid-19 declaration under Section 564(b)(1) of the Act, 21  U.S.C. section 360bbb-3(b)(1), unless the authorization is  terminated or revoked. Performed at [facility], [address], [place], [place] [address]       Studies: No results found.  Scheduled Meds: . aspirin EC  81 mg Oral Daily  . atorvastatin  80 mg Oral Daily  . [START ON 08/24/2020] clopidogrel  75 mg Oral Q breakfast  . insulin aspart  0-5 Units Subcutaneous QHS  . insulin aspart  0-9 Units Subcutaneous TID WC  . isosorbide mononitrate  30 mg Oral Daily  . losartan  25 mg Oral Daily  . metoprolol succinate  50 mg Oral Daily  . sodium chloride flush  3 mL Intravenous Q12H  . sodium chloride flush  3 mL Intravenous Q12H    Continuous Infusions: . sodium chloride    . sodium chloride 1 mL/kg/hr (08/23/20 1613)     LOS: 2 days     Briant Cedar, MD [facility]  If 7PM-7AM, please contact night-coverage [email or web address] 08/23/2020, 4:46 PM

## 2020-08-23 NOTE — Progress Notes (Signed)
Brief post cath note Status post cardiac cath because of unstable angina known coronary disease Found to have multivessel coronary artery disease 95% proximal to mid RCA 75% mid circumflex Patent long LAD stent Patent long circumflex stent Preserved left ventricular function 55% PCI and stent to proximal to mid RCA with DES 95 down to 0% TIMI I flow back to TIMI-3 flow restored Currently on Plavix aspirin Angiomax We will treat with beta-blocker aspirin statin ARB Hopefully will be ready for discharge tomorrow We will stage circumflex lesion which is 75% for a later date Full note to follow

## 2020-08-23 NOTE — Progress Notes (Signed)
ANTICOAGULATION CONSULT NOTE   Pharmacy Consult for Heparin Indication: chest pain/ACS  Patient Measurements: Height: 5\' 2"  (157.5 cm) Weight: 63.4 kg (139 lb 12.8 oz) IBW/kg (Calculated) : 50.1 Heparin Dosing Weight: 61.2 kg  Labs: Recent Labs     0000 08/21/20 1057 08/21/20 1057 08/21/20 1228 08/21/20 1541 08/21/20 1755 08/21/20 2001 08/22/20 0355 08/22/20 0355 08/22/20 1049 08/22/20 1838 08/23/20 0558  HGB   < > 16.0*  --   --   --   --   --  14.8  --   --   --  15.4*  HCT  --  45.7  --   --   --   --   --  42.1  --   --   --  44.1  PLT  --  214  --   --   --   --   --  193  --   --   --  186  APTT  --   --   --  25  --   --   --   --   --   --   --   --   LABPROT  --   --   --  11.6  --   --   --   --   --   --   --   --   INR  --   --   --  0.9  --   --   --   --   --   --   --   --   HEPARINUNFRC  --   --   --   --   --   --    < > 0.21*   < > 0.52 0.50 0.43  CREATININE  --  0.88  --   --   --   --   --   --   --   --   --  0.76  TROPONINIHS  --  67*   < > 76* 86* 90*  --   --   --   --   --   --    < > = values in this interval not displayed.    Estimated Creatinine Clearance: 65.4 mL/min (by C-G formula based on SCr of 0.76 mg/dL).   Medical History: Past Medical History:  Diagnosis Date  . Abscess, gluteal, left    Recurrent drainage, followed by [facility].  . Arthritis   . CAD (coronary artery disease)    Stents in Clogged artery at [facility] June 2014  . Depression   . Elevated lipids   . Hyperlipidemia   . Hypertension   . Myocardial infarction [facility]) June 2014    Assessment: 59 yo female here with upper back pain that feels the same as her previous MI. No oral anticoagulants noted on PTA med list. Pharmacy has been consulted to initiate heparin infusion for suspected ACS.   Goal of Therapy:  Heparin level 0.3-0.7 units/ml Monitor platelets by anticoagulation protocol: Yes   Plan:  11/15 at 0558 HL = 0.43.  Therapeutic x3. Will continue heparin  infusion at 1050 units/hr. Check HL and CBC per protocol tomorrow AM.   Pharmacy will continue to follow  12/15 08/23/2020,7:35 AM

## 2020-08-23 NOTE — Progress Notes (Signed)
Angiomax turned off after 4 hrs post cath running per MD order.

## 2020-08-23 NOTE — Plan of Care (Signed)

## 2020-08-24 LAB — CBC
HCT: 43 % (ref 36.0–46.0)
Hemoglobin: 14.5 g/dL (ref 12.0–15.0)
MCH: 33.6 pg (ref 26.0–34.0)
MCHC: 33.7 g/dL (ref 30.0–36.0)
MCV: 99.8 fL (ref 80.0–100.0)
Platelets: 187 10*3/uL (ref 150–400)
RBC: 4.31 MIL/uL (ref 3.87–5.11)
RDW: 11.9 % (ref 11.5–15.5)
WBC: 6.4 10*3/uL (ref 4.0–10.5)
nRBC: 0 % (ref 0.0–0.2)

## 2020-08-24 LAB — BASIC METABOLIC PANEL
Anion gap: 11 (ref 5–15)
BUN: 13 mg/dL (ref 6–20)
CO2: 23 mmol/L (ref 22–32)
Calcium: 9 mg/dL (ref 8.9–10.3)
Chloride: 105 mmol/L (ref 98–111)
Creatinine, Ser: 0.72 mg/dL (ref 0.44–1.00)
GFR, Estimated: >60 mL/min (ref >60)
Glucose, Bld: 106 mg/dL — ABNORMAL HIGH (ref 70–99)
Potassium: 3.8 mmol/L (ref 3.5–5.1)
Sodium: 139 mmol/L (ref 135–145)

## 2020-08-24 LAB — GLUCOSE, CAPILLARY
Glucose-Capillary: 110 mg/dL — ABNORMAL HIGH (ref 70–99)
Glucose-Capillary: 131 mg/dL — ABNORMAL HIGH (ref 70–99)

## 2020-08-24 MED ORDER — LOSARTAN POTASSIUM 25 MG PO TABS: 25.0000 mg | ORAL_TABLET | Freq: Every day | ORAL | 0 refills | Status: DC

## 2020-08-24 MED ORDER — ISOSORBIDE MONONITRATE ER 30 MG PO TB24: 30.0000 mg | ORAL_TABLET | Freq: Every day | ORAL | 0 refills | Status: DC

## 2020-08-24 MED ORDER — METOPROLOL SUCCINATE ER 50 MG PO TB24: 50.0000 mg | ORAL_TABLET | Freq: Every day | ORAL | 0 refills | Status: DC

## 2020-08-24 MED ORDER — CLOPIDOGREL BISULFATE 75 MG PO TABS: 75.0000 mg | ORAL_TABLET | Freq: Every day | ORAL | 0 refills | Status: AC

## 2020-08-24 MED ORDER — ATORVASTATIN CALCIUM 80 MG PO TABS: 80.0000 mg | ORAL_TABLET | Freq: Every day | ORAL | 0 refills | Status: DC

## 2020-08-24 MED ORDER — ASPIRIN 81 MG PO TBEC: 81.0000 mg | DELAYED_RELEASE_TABLET | Freq: Every day | ORAL | 11 refills | Status: AC

## 2020-08-24 MED ORDER — NITROGLYCERIN 0.4 MG SL SUBL: 0.4000 mg | SUBLINGUAL_TABLET | SUBLINGUAL | 0 refills | Status: DC | PRN

## 2020-08-24 NOTE — Discharge Instructions (Signed)

## 2020-08-24 NOTE — Plan of Care (Signed)
°  Problem: Education: Goal: Knowledge of General Education information will improve Description: Including pain rating scale, medication(s)/side effects and non-pharmacologic comfort measures Outcome: Adequate for Discharge   Problem: Health Behavior/Discharge Planning: Goal: Ability to manage health-related needs will improve Outcome: Adequate for Discharge   Problem: Clinical Measurements: Goal: Ability to maintain clinical measurements within normal limits will improve Outcome: Adequate for Discharge Goal: Will remain free from infection Outcome: Adequate for Discharge Goal: Diagnostic test results will improve Outcome: Adequate for Discharge Goal: Respiratory complications will improve Outcome: Adequate for Discharge Goal: Cardiovascular complication will be avoided Outcome: Adequate for Discharge   Problem: Activity: Goal: Ability to tolerate increased activity will improve Outcome: Adequate for Discharge   Problem: Cardiac: Goal: Ability to achieve and maintain adequate cardiopulmonary perfusion will improve Outcome: Adequate for Discharge Goal: Vascular access site(s) Level 0-1 will be maintained Outcome: Adequate for Discharge

## 2020-08-24 NOTE — Discharge Summary (Signed)
Discharge Summary  Sylvia Wiggins [id number] DOB: 02/17/1961  PCP: Patient, [name]  Admit date: 08/21/2020 Discharge date: 08/24/2020  Time spent: 40 mins  Recommendations for Outpatient Follow-up:  1. Patient advised to establish care with a PCP now she has no insurance 2. Follow-up with cardiology in 1 to 2 weeks    Discharge Diagnoses:  Active Hospital Problems   Diagnosis Date Noted  . Unstable angina (HCC) 08/21/2020  . Nicotine dependence 08/21/2020  . HTN (hypertension) 02/16/2018  . CAD (coronary artery disease) 01/26/2016  . Depression 01/26/2016    Resolved Hospital Problems  No resolved problems to display.    Discharge Condition: Stable  Diet recommendation: Heart healthy/mod carb  Vitals:   08/24/20 0304 08/24/20 0717  BP: 105/71 108/67  Pulse: 78 73  Resp: 13 18  Temp: 97.9 F (36.6 C) 98.4 F (36.9 C)  SpO2: 97% 95%    History of present illness:  Sylvia Cabeza Briggsis a 59 y.o.femalewith medical history significant forCAD status post stent angioplasty,nicotine dependence,history of hypertension, depression and dyslipidemia who presents to the ER for evaluation of mid sternal chest pain/pressureand upperback pain that woke her up from sleep on the morning of her admission.She rated her pain a 10 x 10 in intensity at its worst and states that it was similar to the pain she had when she had a myocardial infarction. Chest pain was associated with diaphoresis but she denies having any nausea, vomiting, shortness of breath, dizziness or lightheadedness.Pain was nonradiating. Patient had some improvement in her pain withsublingualnitroglycerininitiallybutthe pain recurred prompting her visit to the ER. With Nitropaste she is currently chest pain-free.  Patient was started on IV Heparin, labs fairly unremarkable.  Troponin mildly elevated, EKG with no acute ST changes.  Chest x-ray unremarkable.  Cardiology consulted.  Patient admitted for  further management.    Today, patient denied any new complaints, denied any further chest pains, denies any shortness of breath, abdominal pain, nausea/vomiting, fever/chills. Patient advised to establish care with a primary care provider, follow-up with cardiology and quit smoking. Patient refused nicotine patch, and states that she is ready to give up smoking   Hospital Course:  Principal Problem:   Unstable angina (HCC) Active Problems:   CAD (coronary artery disease)   Depression   HTN (hypertension)   Nicotine dependence   Unstable angina/NSTEMI History of CAD s/p stent angioplasty Troponin mildly elevated, EKG with no acute ST changes Echo pending, follow-up with cardiology Cardiology consulted-cath done on 08/23/2020 showed multivessel CAD, 95% proximal to mid RCA, 75% mid circumflex, patent long LAD and circumflex stent.  PCI and stent to proximal to mid RCA with DES Discharge on Plavix, aspirin, metoprolol, Lipitor, losartan, Imdur, nitroglycerin as needed  Hypertension BP stable Continue metoprolol, losartan, Imdur  Hyperlipidemia LDL 153 Continue Lipitor  Tobacco abuse Advised to quit Refuses nicotine patch, but making plans to quit completely          Malnutrition Type:      Malnutrition Characteristics:      Nutrition Interventions:      Estimated body mass index is 25.69 kg/m as calculated from the following:   Height as of this encounter: 5\' 2"  (1.575 m).   Weight as of this encounter: 63.7 kg.    Procedures:  Cardiac catheterization on 08/23/2020  Consultations:  Cardiology  Discharge Exam: BP 108/67 (BP Location: Right Arm)   Pulse 73   Temp 98.4 F (36.9 C) (Oral)   Resp 18   Ht  5\' 2"  (1.575 m)   Wt 63.7 kg   SpO2 95%   BMI 25.69 kg/m   General: NAD Cardiovascular: S1, S2 present, right groin dressing C/D/I Respiratory: CTA B   Discharge Instructions You were cared for by a hospitalist during your hospital  stay. If you have any questions about your discharge medications or the care you received while you were in the hospital after you are discharged, you can call the unit and asked to speak with the hospitalist on call if the hospitalist that took care of you is not available. Once you are discharged, your primary care physician will handle any further medical issues. Please note that NO REFILLS for any discharge medications will be authorized once you are discharged, as it is imperative that you return to your primary care physician (or establish a relationship with a primary care physician if you do not have one) for your aftercare needs so that they can reassess your need for medications and monitor your lab values.  Discharge Instructions    AMB Referral to Cardiac Rehabilitation - Phase II   Complete by: As directed    Diagnosis: Coronary Stents   After initial evaluation and assessments completed: Virtual Based Care may be provided alone or in conjunction with Phase 2 Cardiac Rehab based on patient barriers.: Yes   Diet - low sodium heart healthy   Complete by: As directed    Increase activity slowly   Complete by: As directed    Increase activity slowly   Complete by: As directed      Allergies as of 08/24/2020      Reactions   Penicillins Nausea Only   Did it involve swelling of the face/tongue/throat, SOB, or low BP? No Did it involve sudden or severe rash/hives, skin peeling, or any reaction on the inside of your mouth or nose? No Did you need to seek medical attention at a hospital or doctor's office? No When did it last happen?1990 If all above answers are "NO", may proceed with cephalosporin use.      Medication List    TAKE these medications   aspirin 81 MG EC tablet Take 1 tablet (81 mg total) by mouth daily. Swallow whole. Start taking on: August 25, 2020   atorvastatin 80 MG tablet Commonly known as: LIPITOR Take 1 tablet (80 mg total) by mouth daily. Start  taking on: August 25, 2020   clopidogrel 75 MG tablet Commonly known as: PLAVIX Take 1 tablet (75 mg total) by mouth daily with breakfast. Start taking on: August 25, 2020   isosorbide mononitrate 30 MG 24 hr tablet Commonly known as: IMDUR Take 1 tablet (30 mg total) by mouth daily. Start taking on: August 25, 2020   losartan 25 MG tablet Commonly known as: COZAAR Take 1 tablet (25 mg total) by mouth daily. Start taking on: August 25, 2020   metoprolol succinate 50 MG 24 hr tablet Commonly known as: TOPROL-XL Take 1 tablet (50 mg total) by mouth daily. Take with or immediately following a meal. Start taking on: August 25, 2020   nitroGLYCERIN 0.4 MG SL tablet Commonly known as: NITROSTAT Place 1 tablet (0.4 mg total) under the tongue every 5 (five) minutes x 3 doses as needed for chest pain.      Allergies  Allergen Reactions  . Penicillins Nausea Only    Did it involve swelling of the face/tongue/throat, SOB, or low BP? No Did it involve sudden or severe rash/hives, skin peeling, or any  reaction on the inside of your mouth or nose? No Did you need to seek medical attention at a hospital or doctor's office? No When did it last happen?1990 If all above answers are "NO", may proceed with cephalosporin use.     Follow-up Information    Dorothyann Peng D, MD. Schedule an appointment as soon as possible for a visit in 1 week(s).   Specialties: Cardiology, Internal Medicine Contact information: [address] [place] [address] [phone]                The results of significant diagnostics from this hospitalization (including imaging, microbiology, ancillary and laboratory) are listed below for reference.    Significant Diagnostic Studies: DG Chest 2 View  Result Date: 08/21/2020 CLINICAL DATA:  Chest pain EXAM: CHEST - 2 VIEW COMPARISON:  December 03, 2019 FINDINGS: Lungs are clear. Heart size and pulmonary vascularity are normal.  No adenopathy. Apparent stent in the left anterior descending coronary artery. No pneumothorax. No bone lesions. IMPRESSION: Apparent coronary artery stent.  Heart size normal.  Lungs clear. Electronically Signed   By: Bretta Bang III M.D.   On: 08/21/2020 11:10    Microbiology: Recent Results (from the past 240 hour(s))  Respiratory Panel by RT PCR (Flu A&B, Covid) - Nasopharyngeal Swab     Status: None   Collection Time: 08/21/20 12:28 PM   Specimen: Nasopharyngeal Swab  Result Value Ref Range Status   SARS Coronavirus 2 by RT PCR NEGATIVE NEGATIVE Final    Comment: (NOTE) SARS-CoV-2 target nucleic acids are NOT DETECTED.  The SARS-CoV-2 RNA is generally detectable in upper respiratoy specimens during the acute phase of infection. The lowest concentration of SARS-CoV-2 viral copies this assay can detect is 131 copies/mL. A negative result does not preclude SARS-Cov-2 infection and should not be used as the sole basis for treatment or other patient management decisions. A negative result may occur with  improper specimen collection/handling, submission of specimen other than nasopharyngeal swab, presence of viral mutation(s) within the areas targeted by this assay, and inadequate number of viral copies (<131 copies/mL). A negative result must be combined with clinical observations, patient history, and epidemiological information. The expected result is Negative.  Fact Sheet for Patients:  https://www.moore.com/  Fact Sheet for Healthcare Providers:  https://www.young.biz/  This test is no t yet approved or cleared by the [place] FDA and  has been authorized for detection and/or diagnosis of SARS-CoV-2 by FDA under an Emergency Use Authorization (EUA). This EUA will remain  in effect (meaning this test can be used) for the duration of the COVID-19 declaration under Section 564(b)(1) of the Act, 21 U.S.C. section 360bbb-3(b)(1),  unless the authorization is terminated or revoked sooner.     Influenza A by PCR NEGATIVE NEGATIVE Final   Influenza B by PCR NEGATIVE NEGATIVE Final    Comment: (NOTE) The Xpert Xpress SARS-CoV-2/FLU/RSV assay is intended as an aid in  the diagnosis of influenza from Nasopharyngeal swab specimens and  should not be used as a sole basis for treatment. Nasal washings and  aspirates are unacceptable for Xpert Xpress SARS-CoV-2/FLU/RSV  testing.  Fact Sheet for Patients: https://www.moore.com/  Fact Sheet for Healthcare Providers: https://www.young.biz/  This test is not yet approved or cleared by the [place] FDA and  has been authorized for detection and/or diagnosis of SARS-CoV-2 by  FDA under an Emergency Use Authorization (EUA). This EUA will remain  in effect (meaning this test can be used) for the  duration of the  Covid-19 declaration under Section 564(b)(1) of the Act, 21  U.S.C. section 360bbb-3(b)(1), unless the authorization is  terminated or revoked. Performed at [facility], [address] [place], [place] [address]      Labs: Basic Metabolic Panel: Recent Labs  Lab 08/21/20 1057 08/23/20 0558 08/24/20 0520  NA 136 137 139  K 3.8 4.0 3.8  CL 104 106 105  CO2 24 21* 23  GLUCOSE 143* 97 106*  BUN 12 13 13   CREATININE 0.88 0.76 0.72  CALCIUM 9.7 9.1 9.0   Liver Function Tests: No results for input(s): AST, ALT, ALKPHOS, BILITOT, PROT, ALBUMIN in the last 168 hours. No results for input(s): LIPASE, AMYLASE in the last 168 hours. No results for input(s): AMMONIA in the last 168 hours. CBC: Recent Labs  Lab 08/21/20 1057 08/22/20 0355 08/23/20 0558 08/24/20 0520  WBC 6.4 7.2 6.2 6.4  HGB 16.0* 14.8 15.4* 14.5  HCT 45.7 42.1 44.1 43.0  MCV 97.9 97.5 98.2 99.8  PLT 214 193 186 187   Cardiac Enzymes: No results for input(s): CKTOTAL, CKMB, CKMBINDEX, TROPONINI in the last 168 hours. BNP: BNP  (last 3 results) No results for input(s): BNP in the last 8760 hours.  ProBNP (last 3 results) No results for input(s): PROBNP in the last 8760 hours.  CBG: Recent Labs  Lab 08/23/20 0737 08/23/20 1703 08/23/20 2048 08/24/20 0817 08/24/20 1146  GLUCAP 94 194* 101* 110* 131*       Signed:  08/26/20, MD [facility] 08/24/2020, 1:00 PM

## 2020-08-24 NOTE — Care Management Important Message (Signed)
Important Message  Patient Details  Name: Sylvia Wiggins MRN: [id number] Date of Birth: 1960-10-13   Medicare Important Message Given:  Yes     Johnell Comings 08/24/2020, 11:01 AM

## 2020-08-24 NOTE — Progress Notes (Signed)
[facility] Cardiology    SUBJECTIVE: Patient doing reasonably well no chest pain no groin issues no shortness of breath ambulating in the halls adequately.  Patient status post cardiac cath PCI and stent ready for discharge tolerating medications well no episodes of angina   Vitals:   08/23/20 1546 08/23/20 1955 08/24/20 0304 08/24/20 0717  BP: (!) 146/99 (!) 120/91 105/71 108/67  Pulse: 77 74 78 73  Resp: 20 15 13 18   Temp: 97.8 F (36.6 C) 97.9 F (36.6 C) 97.9 F (36.6 C) 98.4 F (36.9 C)  TempSrc: Oral  Oral Oral  SpO2: 100% 100% 97% 95%  Weight:   63.7 kg   Height:         Intake/Output Summary (Last 24 hours) at 08/24/2020 1222 Last data filed at 08/24/2020 0945 Gross per 24 hour  Intake 603 ml  Output 500 ml  Net 103 ml      PHYSICAL EXAM  General: Well developed, well nourished, in no acute distress HEENT:  Normocephalic and atramatic Neck:  No JVD.  Lungs: Clear bilaterally to auscultation and percussion. Heart: HRRR . Normal S1 and S2 without gallops or murmurs.  Abdomen: Bowel sounds are positive, abdomen soft and non-tender  Msk:  Back normal, normal gait. Normal strength and tone for age. Extremities: No clubbing, cyanosis or edema.   Neuro: Alert and oriented X 3. Psych:  Good affect, responds appropriately   LABS: Basic Metabolic Panel: Recent Labs    08/23/20 0558 08/24/20 0520  NA 137 139  K 4.0 3.8  CL 106 105  CO2 21* 23  GLUCOSE 97 106*  BUN 13 13  CREATININE 0.76 0.72  CALCIUM 9.1 9.0   Liver Function Tests: No results for input(s): AST, ALT, ALKPHOS, BILITOT, PROT, ALBUMIN in the last 72 hours. No results for input(s): LIPASE, AMYLASE in the last 72 hours. CBC: Recent Labs    08/23/20 0558 08/24/20 0520  WBC 6.2 6.4  HGB 15.4* 14.5  HCT 44.1 43.0  MCV 98.2 99.8  PLT 186 187   Cardiac Enzymes: No results for input(s): CKTOTAL, CKMB, CKMBINDEX, TROPONINI in the last 72 hours. BNP: Invalid input(s): POCBNP D-Dimer: No  results for input(s): DDIMER in the last 72 hours. Hemoglobin A1C: Recent Labs    08/22/20 0355  HGBA1C 5.6   Fasting Lipid Panel: Recent Labs    08/22/20 0355  CHOL 247*  HDL 43  LDLCALC 153*  TRIG 256*  CHOLHDL 5.7   Thyroid Function Tests: No results for input(s): TSH, T4TOTAL, T3FREE, THYROIDAB in the last 72 hours.  Invalid input(s): FREET3 Anemia Panel: No results for input(s): VITAMINB12, FOLATE, FERRITIN, TIBC, IRON, RETICCTPCT in the last 72 hours.  No results found.   Echo preserved overall left ventricular function EF of 60%  TELEMETRY: Normal sinus rhythm rate of 65 nonspecific T2 changes  ASSESSMENT AND PLAN:  Principal Problem:   Unstable angina (HCC) Active Problems:   CAD (coronary artery disease)   Depression   HTN (hypertension)   Nicotine dependence Status post cardiac cath Status post PCI and stent to RCA  Plan Patient should be stable for discharge today Recommend aspirin 81 mg daily Plavix 75 mg daily Recommend beta-blocker metoprolol 50 mg daily losartan 25 mg daily Statin therapy with Lipitor 80 mg daily Refer the patient to cardiac rehab Remove dressing within 48 hours Advised patient to refrain from smoking consider adding NicoDerm patch No heavy lifting for at least 1 week Follow-up with cardiology 1 to 2 weeks  Cardiac medication list Aspirin 81 mg daily Plavix 75 mg daily Metoprolol succinate 50 mg daily Losartan 25 mg daily Lipitor 80 mg daily Imdur 30 mg daily Nitroglycerin sublingual as needed  Alwyn Pea, MD 08/24/2020 12:22 PM

## 2020-08-25 ENCOUNTER — Encounter: Payer: Self-pay | Admitting: Internal Medicine

## 2020-09-16 LAB — ECHOCARDIOGRAM COMPLETE
AR max vel: 1.37 cm2
AV Peak grad: 6.2 mmHg
Ao pk vel: 1.24 m/s
Area-P 1/2: 3.99 cm2
Height: 62 in
S' Lateral: 3.03 cm
Weight: 2160 oz

## 2020-09-29 ENCOUNTER — Other Ambulatory Visit: Payer: Self-pay

## 2020-09-29 ENCOUNTER — Emergency Department: Payer: Medicare HMO

## 2020-09-29 ENCOUNTER — Observation Stay
Admission: EM | Admit: 2020-09-29 | Discharge: 2020-10-01 | Disposition: A | Discharge status: Home / Self Care | Payer: Medicare HMO | Attending: Internal Medicine | Admitting: Internal Medicine

## 2020-09-29 DIAGNOSIS — Z7982 Long term (current) use of aspirin: Secondary | ICD-10-CM | POA: Diagnosis not present

## 2020-09-29 DIAGNOSIS — I25110 Atherosclerotic heart disease of native coronary artery with unstable angina pectoris: Secondary | ICD-10-CM | POA: Insufficient documentation

## 2020-09-29 DIAGNOSIS — I2510 Atherosclerotic heart disease of native coronary artery without angina pectoris: Secondary | ICD-10-CM | POA: Diagnosis present

## 2020-09-29 DIAGNOSIS — E785 Hyperlipidemia, unspecified: Secondary | ICD-10-CM | POA: Diagnosis present

## 2020-09-29 DIAGNOSIS — I10 Essential (primary) hypertension: Secondary | ICD-10-CM | POA: Diagnosis present

## 2020-09-29 DIAGNOSIS — I119 Hypertensive heart disease without heart failure: Secondary | ICD-10-CM | POA: Insufficient documentation

## 2020-09-29 DIAGNOSIS — R072 Precordial pain: Secondary | ICD-10-CM | POA: Diagnosis present

## 2020-09-29 DIAGNOSIS — I214 Non-ST elevation (NSTEMI) myocardial infarction: Secondary | ICD-10-CM | POA: Diagnosis not present

## 2020-09-29 DIAGNOSIS — F32A Depression, unspecified: Secondary | ICD-10-CM | POA: Diagnosis present

## 2020-09-29 DIAGNOSIS — F17210 Nicotine dependence, cigarettes, uncomplicated: Secondary | ICD-10-CM | POA: Diagnosis not present

## 2020-09-29 DIAGNOSIS — Z79899 Other long term (current) drug therapy: Secondary | ICD-10-CM | POA: Diagnosis not present

## 2020-09-29 DIAGNOSIS — F17200 Nicotine dependence, unspecified, uncomplicated: Secondary | ICD-10-CM | POA: Diagnosis present

## 2020-09-29 LAB — CBC
HCT: 46.8 % — ABNORMAL HIGH (ref 36.0–46.0)
Hemoglobin: 16 g/dL — ABNORMAL HIGH (ref 12.0–15.0)
MCH: 33.8 pg (ref 26.0–34.0)
MCHC: 34.2 g/dL (ref 30.0–36.0)
MCV: 98.7 fL (ref 80.0–100.0)
Platelets: 231 10*3/uL (ref 150–400)
RBC: 4.74 MIL/uL (ref 3.87–5.11)
RDW: 12.1 % (ref 11.5–15.5)
WBC: 9.6 10*3/uL (ref 4.0–10.5)
nRBC: 0 % (ref 0.0–0.2)

## 2020-09-29 LAB — TROPONIN I (HIGH SENSITIVITY)
Troponin I (High Sensitivity): 85 ng/L — ABNORMAL HIGH (ref <18)
Troponin I (High Sensitivity): 163 ng/L (ref <18)

## 2020-09-29 LAB — BASIC METABOLIC PANEL
Anion gap: 11 (ref 5–15)
BUN: 9 mg/dL (ref 6–20)
CO2: 23 mmol/L (ref 22–32)
Calcium: 9.4 mg/dL (ref 8.9–10.3)
Chloride: 105 mmol/L (ref 98–111)
Creatinine, Ser: 0.75 mg/dL (ref 0.44–1.00)
GFR, Estimated: >60 mL/min (ref >60)
Glucose, Bld: 127 mg/dL — ABNORMAL HIGH (ref 70–99)
Potassium: 3.9 mmol/L (ref 3.5–5.1)
Sodium: 139 mmol/L (ref 135–145)

## 2020-09-29 MED ORDER — ONDANSETRON HCL 4 MG/2ML IJ SOLN: 4.0000 mg | Freq: Four times a day (QID) | INTRAMUSCULAR | Status: DC | PRN

## 2020-09-29 MED ORDER — ASPIRIN EC 81 MG PO TBEC
81.0000 mg | DELAYED_RELEASE_TABLET | Freq: Every day | ORAL | Status: DC
  Administered 2020-09-30 – 2020-10-01 (×2): 81 mg via ORAL
  Filled 2020-09-29 (×2): qty 1

## 2020-09-29 MED ORDER — HEPARIN BOLUS VIA INFUSION
3500.0000 [IU] | Freq: Once | INTRAVENOUS | Status: AC
  Administered 2020-09-29: 3500 [IU] via INTRAVENOUS
  Filled 2020-09-29: qty 3500

## 2020-09-29 MED ORDER — LOSARTAN POTASSIUM 50 MG PO TABS
25.0000 mg | ORAL_TABLET | Freq: Every day | ORAL | Status: DC
  Administered 2020-09-30 – 2020-10-01 (×2): 25 mg via ORAL
  Filled 2020-09-29 (×2): qty 1

## 2020-09-29 MED ORDER — METOPROLOL SUCCINATE ER 50 MG PO TB24
50.0000 mg | ORAL_TABLET | Freq: Every day | ORAL | Status: DC
  Administered 2020-09-30 – 2020-10-01 (×2): 50 mg via ORAL
  Filled 2020-09-29 (×2): qty 1

## 2020-09-29 MED ORDER — HEPARIN (PORCINE) 25000 UT/250ML-% IV SOLN
900.0000 [IU]/h | INTRAVENOUS | Status: DC
  Administered 2020-09-29: 700 [IU]/h via INTRAVENOUS
  Filled 2020-09-29 (×2): qty 250

## 2020-09-29 MED ORDER — ACETAMINOPHEN 325 MG PO TABS: 650.0000 mg | ORAL_TABLET | ORAL | Status: DC | PRN

## 2020-09-29 MED ORDER — NITROGLYCERIN 0.4 MG SL SUBL: 0.4000 mg | SUBLINGUAL_TABLET | SUBLINGUAL | Status: DC | PRN

## 2020-09-29 MED ORDER — ASPIRIN EC 81 MG PO TBEC: 81.0000 mg | DELAYED_RELEASE_TABLET | Freq: Every day | ORAL | Status: DC

## 2020-09-29 MED ORDER — ISOSORBIDE MONONITRATE ER 60 MG PO TB24
30.0000 mg | ORAL_TABLET | Freq: Every day | ORAL | Status: DC
  Administered 2020-09-30 – 2020-10-01 (×2): 30 mg via ORAL
  Filled 2020-09-29 (×2): qty 1

## 2020-09-29 MED ORDER — ATORVASTATIN CALCIUM 80 MG PO TABS
80.0000 mg | ORAL_TABLET | Freq: Every day | ORAL | Status: DC
  Administered 2020-09-30 – 2020-10-01 (×2): 80 mg via ORAL
  Filled 2020-09-29 (×2): qty 4

## 2020-09-29 MED ORDER — CLOPIDOGREL BISULFATE 75 MG PO TABS
75.0000 mg | ORAL_TABLET | Freq: Every day | ORAL | Status: DC
  Administered 2020-09-30 – 2020-10-01 (×2): 75 mg via ORAL
  Filled 2020-09-29 (×2): qty 1

## 2020-09-29 NOTE — H&P (Addendum)
History and Physical    Sylvia Wiggins YJE:[id number] DOB: 1960/10/16 DOA: 09/29/2020  PCP: Patient, [name]   Patient coming from: Home  I have personally briefly reviewed patient's old medical records in [facility]  Chief Complaint: Recurring chest pain  HPI: Sylvia Wiggins is a 59 y.o. female with medical history significant for Multivessel CAD status post DES to mid RCA on 08/23/20 and on dual antiplatelet therapy, as well as history of HTN, HLD, depression and history of nicotine dependence who quit smoking a month ago, who presents to the emergency room after having four episodes of retrosternal chest pain radiating to the upper back, resolved with sublingual nitroglycerin after 10 minutes.  Took a total of 4 tablets throughout the day.  Pain was of moderate intensity, both at rest and exertional and associated with diaphoresis, but no nausea vomiting or lightheadedness.  States she has been fully compliant with the medication since her cath a month ago and had been doing well until the day of arrival she was chest pain-free on arrival.  She denies cough fever or chills.  Denies shortness of breath, lower extremity pain or swelling. ED course: On arrival she was afebrile, BP 127/75 with pulse 57 O2 sat 100% on room air.  Troponin 85>>163.  Blood work otherwise unremarkable. EKG as interpreted by me: Sinus bradycardia at 57 with nonspecific ST-T wave changes Imaging: No acute cardiopulmonary disease  Hospitalist consulted for admission.  Patient started on heparin in the ER   Review of Systems: As per HPI otherwise all other systems on review of systems negative.    Past Medical History:  Diagnosis Date  . Abscess, gluteal, left    Recurrent drainage, [facility] by [facility].  . Arthritis   . CAD (coronary artery disease)    Stents in Clogged artery at [facility] June 2014  . Depression   . Elevated lipids   . Hyperlipidemia   . Hypertension   . Myocardial infarction [facility]) June  2014    Past Surgical History:  Procedure Laterality Date  . CARDIAC CATHETERIZATION N/A 08/30/2016   Procedure: Left Heart Cath and Coronary Angiography;  Surgeon: Marcina Millard, MD;  Location: [facility] INVASIVE CV LAB;  Service: Cardiovascular;  Laterality: N/A;  . CARDIAC SURGERY  03/30/2013   stent placement  . CORONARY ANGIOPLASTY     cardiac stents x 2  . CORONARY STENT INTERVENTION N/A 08/23/2020   Procedure: CORONARY STENT INTERVENTION;  Surgeon: Alwyn Pea, MD;  Location: [facility] INVASIVE CV LAB;  Service: Cardiovascular;  Laterality: N/A;  . INGUINAL HERNIA REPAIR Right 02/26/2020   Procedure: HERNIA REPAIR INGUINAL ADULT;  Surgeon: Sung Amabile, DO;  Location: [facility] ORS;  Service: General;  Laterality: Right;  . LEFT HEART CATH AND CORONARY ANGIOGRAPHY N/A 08/23/2020   Procedure: LEFT HEART CATH AND CORONARY ANGIOGRAPHY and possible PCI and stent;  Surgeon: Alwyn Pea, MD;  Location: [facility] INVASIVE CV LAB;  Service: Cardiovascular;  Laterality: N/A;  . LEFT HEART CATH AND CORS/GRAFTS ANGIOGRAPHY N/A 02/18/2018   Procedure: LEFT HEART CATH AND CORS/GRAFTS ANGIOGRAPHY;  Surgeon: Dalia Heading, MD;  Location: [facility] INVASIVE CV LAB;  Service: Cardiovascular;  Laterality: N/A;  . RECTAL SURGERY  Anal Fissure  . XI ROBOTIC ASSISTED INGUINAL HERNIA REPAIR WITH MESH Right 05/16/2019   Procedure: XI ROBOTIC ASSISTED Right INGUINAL HERNIA REPAIR WITH MESH;  Surgeon: Sung Amabile, DO;  Location: [facility] ORS;  Service: General;  Laterality: Right;     reports that she  has been smoking cigarettes. She has been smoking about 0.25 packs per day. She has never used smokeless tobacco. She reports that she does not drink alcohol and does not use drugs.  Allergies  Allergen Reactions  . Other Other (See Comments)    Pt states the gas used to put her to sleep made her have a bad headache, "out of it for 3 days", made patient easy to anger  . Sulfamethoxazole-Trimethoprim Nausea Only  .  Penicillins Nausea Only    Did it involve swelling of the face/tongue/throat, SOB, or low BP? No Did it involve sudden or severe rash/hives, skin peeling, or any reaction on the inside of your mouth or nose? No Did you need to seek medical attention at a hospital or doctor's office? No When did it last happen?1990 If all above answers are "NO", may proceed with cephalosporin use.     Family History  Problem Relation Age of Onset  . Cancer Mother        colon  . Heart disease Father        Died of Heart Attack      Prior to Admission medications   Medication Sig Start Date End Date Taking? Authorizing Provider  aspirin EC 81 MG EC tablet Take 1 tablet (81 mg total) by mouth daily. Swallow whole. 08/25/20  Yes Briant CedarEzenduka, Nkeiruka J, MD  atorvastatin (LIPITOR) 80 MG tablet Take 1 tablet (80 mg total) by mouth daily. 08/25/20 09/24/20 Yes Briant CedarEzenduka, Nkeiruka J, MD  clopidogrel (PLAVIX) 75 MG tablet Take 75 mg by mouth daily. 09/23/20  Yes [provider]  gabapentin (NEURONTIN) 300 MG capsule Take 300 mg by mouth 3 (three) times daily. 09/23/20  Yes [provider]  isosorbide mononitrate (IMDUR) 30 MG 24 hr tablet Take 1 tablet (30 mg total) by mouth daily. 08/25/20 09/24/20 Yes Briant CedarEzenduka, Nkeiruka J, MD  losartan (COZAAR) 25 MG tablet Take 1 tablet (25 mg total) by mouth daily. 08/25/20 09/24/20 Yes Briant CedarEzenduka, Nkeiruka J, MD  metoprolol succinate (TOPROL-XL) 50 MG 24 hr tablet Take 1 tablet (50 mg total) by mouth daily. Take with or immediately following a meal. 08/25/20 09/24/20 Yes Briant CedarEzenduka, Nkeiruka J, MD  nitroGLYCERIN (NITROSTAT) 0.4 MG SL tablet Place 1 tablet (0.4 mg total) under the tongue every 5 (five) minutes x 3 doses as needed for chest pain. 08/24/20 09/23/20 Yes Briant CedarEzenduka, Nkeiruka J, MD    Physical Exam: Vitals:   09/29/20 1737 09/29/20 1738 09/29/20 2044 09/29/20 2136  BP: 127/75  (!) 175/102 (!) 165/91  Pulse: (!) 57  (!) 56 (!) 58  Resp: 18  18  15   Temp: 98.1 F (36.7 C)     TempSrc: Oral     SpO2: 100%  100% 97%  Weight:  61.2 kg    Height:  5\' 2"  (1.575 m)       Vitals:   09/29/20 1737 09/29/20 1738 09/29/20 2044 09/29/20 2136  BP: 127/75  (!) 175/102 (!) 165/91  Pulse: (!) 57  (!) 56 (!) 58  Resp: 18  18 15   Temp: 98.1 F (36.7 C)     TempSrc: Oral     SpO2: 100%  100% 97%  Weight:  61.2 kg    Height:  5\' 2"  (1.575 m)        Constitutional: Alert and oriented x 3 . Not in any apparent distress HEENT:      Head: Normocephalic and atraumatic.         Eyes: PERLA, EOMI,  Conjunctivae are normal. Sclera is non-icteric.       Mouth/Throat: Mucous membranes are moist.       Neck: Supple with no signs of meningismus. Cardiovascular: Regular rate and rhythm. No murmurs, gallops, or rubs. 2+ symmetrical distal pulses are present . No JVD. No LE edema Respiratory: Respiratory effort normal .Lungs sounds clear bilaterally. No wheezes, crackles, or rhonchi.  Gastrointestinal: Soft, non tender, and non distended with positive bowel sounds.  Genitourinary: No CVA tenderness. Musculoskeletal: Nontender with normal range of motion in all extremities. No cyanosis, or erythema of extremities. Neurologic:  Face is symmetric. Moving all extremities. No gross focal neurologic deficits . Skin: Skin is warm, dry.  No rash or ulcers Psychiatric: Mood and affect are normal    Labs on Admission: I have personally reviewed following labs and imaging studies  CBC: Recent Labs  Lab 09/29/20 1740  WBC 9.6  HGB 16.0*  HCT 46.8*  MCV 98.7  PLT 231   Basic Metabolic Panel: Recent Labs  Lab 09/29/20 1740  NA 139  K 3.9  CL 105  CO2 23  GLUCOSE 127*  BUN 9  CREATININE 0.75  CALCIUM 9.4   GFR: Estimated Creatinine Clearance: 64.3 mL/min (by C-G formula based on SCr of 0.75 mg/dL). Liver Function Tests: No results for input(s): AST, ALT, ALKPHOS, BILITOT, PROT, ALBUMIN in the last 168 hours. No results for input(s):  LIPASE, AMYLASE in the last 168 hours. No results for input(s): AMMONIA in the last 168 hours. Coagulation Profile: No results for input(s): INR, PROTIME in the last 168 hours. Cardiac Enzymes: No results for input(s): CKTOTAL, CKMB, CKMBINDEX, TROPONINI in the last 168 hours. BNP (last 3 results) No results for input(s): PROBNP in the last 8760 hours. HbA1C: No results for input(s): HGBA1C in the last 72 hours. CBG: No results for input(s): GLUCAP in the last 168 hours. Lipid Profile: No results for input(s): CHOL, HDL, LDLCALC, TRIG, CHOLHDL, LDLDIRECT in the last 72 hours. Thyroid Function Tests: No results for input(s): TSH, T4TOTAL, FREET4, T3FREE, THYROIDAB in the last 72 hours. Anemia Panel: No results for input(s): VITAMINB12, FOLATE, FERRITIN, TIBC, IRON, RETICCTPCT in the last 72 hours. Urine analysis: No results found for: COLORURINE, APPEARANCEUR, LABSPEC, PHURINE, GLUCOSEU, HGBUR, BILIRUBINUR, KETONESUR, PROTEINUR, UROBILINOGEN, NITRITE, LEUKOCYTESUR  Radiological Exams on Admission: DG Chest 2 View  Result Date: 09/29/2020 CLINICAL DATA:  Chest pain radiating to back. Diaphoresis. Coronary artery disease. EXAM: CHEST - 2 VIEW COMPARISON:  08/21/2020 FINDINGS: The heart size and mediastinal contours are within normal limits. Coronary artery stents again noted. Both lungs are clear. The visualized skeletal structures are unremarkable. IMPRESSION: No active cardiopulmonary disease. Electronically Signed   By: Danae Orleans M.D.   On: 09/29/2020 18:33     Assessment/Plan 59 year old female  with multivessel CAD status post cath with DES to mid RCA on 08/23/20 and on dual antiplatelet therapy as well as history of HTN, HLD, depression and recent nicotine dependence, presenting with recurrent chest pain resolving with SL NTG, chest pain-free on admission.  NSTEMI/unstable angina Multivessel CAD on cath 11/15 -Patient presenting with chest pain on exertion responding to SL  NTG, EKG with nonspecific ST-T wave changes.  Troponin 85>>163 -Status post cath 11/15 that showed multivessel disease, with DES to mid RCA -Continue heparin infusion -Continue metoprolol, isosorbide, atorvastatin, dual antiplatelet and as needed sublingual nitroglycerin -Cardiology consult    Hypertension -Continue Cozaar, metoprolol    Depression -Continue home meds, pending med rec    Hyperlipidemia -Continue  atorvastatin    Ex-smoker, recent -patient stopped smoking after hospitalization a month prior    DVT prophylaxis: Heparin infusion Code Status: full code  Family Communication:  none  Disposition Plan: Back to previous home environment Consults called: Cardiology Status:observation     Andris Baumann MD [facility]     09/29/2020, 10:57 PM

## 2020-09-29 NOTE — ED Provider Notes (Signed)
[facility] Emergency Department Provider Note  ____________________________________________  Time seen: Approximately 11:33 PM  I have reviewed the triage vital signs and the nursing notes.   HISTORY  Chief Complaint Chest Pain    HPI Sylvia Wiggins is a 59 y.o. female with a history of hypertension hyperlipidemia CAD status post PCI 1 month ago who comes the ED complaining of chest pain throughout the day today.  Radiating to the back, associated with diaphoresis.  Worse with exertion.  She took a total of 4 nitroglycerin tablets to resolve the pain at home.  She has been compliant with her medications at home.      Past Medical History:  Diagnosis Date  . Abscess, gluteal, left    Recurrent drainage, followed by [facility].  . Arthritis   . CAD (coronary artery disease)    Stents in Clogged artery at [facility] June 2014  . Depression   . Elevated lipids   . Hyperlipidemia   . Hypertension   . Myocardial infarction [facility]) June 2014     Patient Active Problem List   Diagnosis Date Noted  . Unstable angina (HCC) 08/21/2020  . Nicotine dependence 08/21/2020  . HTN (hypertension) 02/16/2018  . Abnormal EKG 02/16/2018  . Chest pain 02/16/2018  . Chest pain, rule out acute myocardial infarction 08/30/2016  . NSTEMI (non-ST elevated myocardial infarction) (HCC) 08/30/2016  . CAD (coronary artery disease) 01/26/2016  . Hypertension 01/26/2016  . Depression 01/26/2016  . Hyperlipidemia 01/26/2016  . Tremor of both hands 01/26/2016  . Chronic right-sided low back pain with right-sided sciatica 01/26/2016  . Major depression 10/05/2015     Past Surgical History:  Procedure Laterality Date  . CARDIAC CATHETERIZATION N/A 08/30/2016   Procedure: Left Heart Cath and Coronary Angiography;  Surgeon: Marcina Millard, MD;  Location: [facility] INVASIVE CV LAB;  Service: Cardiovascular;  Laterality: N/A;  . CARDIAC SURGERY  03/30/2013   stent placement  .  CORONARY ANGIOPLASTY     cardiac stents x 2  . CORONARY STENT INTERVENTION N/A 08/23/2020   Procedure: CORONARY STENT INTERVENTION;  Surgeon: Alwyn Pea, MD;  Location: [facility] INVASIVE CV LAB;  Service: Cardiovascular;  Laterality: N/A;  . INGUINAL HERNIA REPAIR Right 02/26/2020   Procedure: HERNIA REPAIR INGUINAL ADULT;  Surgeon: Sung Amabile, DO;  Location: [facility] ORS;  Service: General;  Laterality: Right;  . LEFT HEART CATH AND CORONARY ANGIOGRAPHY N/A 08/23/2020   Procedure: LEFT HEART CATH AND CORONARY ANGIOGRAPHY and possible PCI and stent;  Surgeon: Alwyn Pea, MD;  Location: [facility] INVASIVE CV LAB;  Service: Cardiovascular;  Laterality: N/A;  . LEFT HEART CATH AND CORS/GRAFTS ANGIOGRAPHY N/A 02/18/2018   Procedure: LEFT HEART CATH AND CORS/GRAFTS ANGIOGRAPHY;  Surgeon: Dalia Heading, MD;  Location: [facility] INVASIVE CV LAB;  Service: Cardiovascular;  Laterality: N/A;  . RECTAL SURGERY  Anal Fissure  . XI ROBOTIC ASSISTED INGUINAL HERNIA REPAIR WITH MESH Right 05/16/2019   Procedure: XI ROBOTIC ASSISTED Right INGUINAL HERNIA REPAIR WITH MESH;  Surgeon: Sung Amabile, DO;  Location: [facility] ORS;  Service: General;  Laterality: Right;     Prior to Admission medications   Medication Sig Start Date End Date Taking? Authorizing Provider  aspirin EC 81 MG EC tablet Take 1 tablet (81 mg total) by mouth daily. Swallow whole. 08/25/20  Yes Briant Cedar, MD  atorvastatin (LIPITOR) 80 MG tablet Take 1 tablet (80 mg total) by mouth daily. 08/25/20 09/24/20 Yes Briant Cedar, MD  clopidogrel (PLAVIX) 75  MG tablet Take 75 mg by mouth daily. 09/23/20  Yes [provider]  gabapentin (NEURONTIN) 300 MG capsule Take 300 mg by mouth 3 (three) times daily. 09/23/20  Yes [provider]  isosorbide mononitrate (IMDUR) 30 MG 24 hr tablet Take 1 tablet (30 mg total) by mouth daily. 08/25/20 09/24/20 Yes Briant CedarEzenduka, Nkeiruka J, MD  losartan (COZAAR) 25 MG tablet Take 1 tablet  (25 mg total) by mouth daily. 08/25/20 09/24/20 Yes Briant CedarEzenduka, Nkeiruka J, MD  metoprolol succinate (TOPROL-XL) 50 MG 24 hr tablet Take 1 tablet (50 mg total) by mouth daily. Take with or immediately following a meal. 08/25/20 09/24/20 Yes Briant CedarEzenduka, Nkeiruka J, MD  nitroGLYCERIN (NITROSTAT) 0.4 MG SL tablet Place 1 tablet (0.4 mg total) under the tongue every 5 (five) minutes x 3 doses as needed for chest pain. 08/24/20 09/23/20 Yes Briant CedarEzenduka, Nkeiruka J, MD     Allergies Other, Sulfamethoxazole-trimethoprim, and Penicillins   Family History  Problem Relation Age of Onset  . Cancer Mother        colon  . Heart disease Father        Died of Heart Attack    Social History Social History   Tobacco Use  . Smoking status: Current Every Day Smoker    Packs/day: 0.25    Types: Cigarettes  . Smokeless tobacco: Never Used  Vaping Use  . Vaping Use: Never used  Substance Use Topics  . Alcohol use: No    Alcohol/week: 0.0 standard drinks  . Drug use: No    Review of Systems  Constitutional:   No fever or chills.  ENT:   No sore throat. No rhinorrhea. Cardiovascular: Positive chest pain as above without syncope. Respiratory:   No dyspnea or cough. Gastrointestinal:   Negative for abdominal pain, vomiting and diarrhea.  Musculoskeletal:   Negative for focal pain or swelling All other systems reviewed and are negative except as documented above in ROS and HPI.  ____________________________________________   PHYSICAL EXAM:  VITAL SIGNS: ED Triage Vitals  Enc Vitals Group     BP 09/29/20 1737 127/75     Pulse Rate 09/29/20 1737 (!) 57     Resp 09/29/20 1737 18     Temp 09/29/20 1737 98.1 F (36.7 C)     Temp Source 09/29/20 1737 Oral     SpO2 09/29/20 1737 100 %     Weight 09/29/20 1738 135 lb (61.2 kg)     Height 09/29/20 1738 5\' 2"  (1.575 m)     Head Circumference --      Peak Flow --      Pain Score 09/29/20 1737 0     Pain Loc --      Pain Edu? --      Excl. in GC?  --     Vital signs reviewed, nursing assessments reviewed.   Constitutional:   Alert and oriented. Non-toxic appearance. Eyes:   Conjunctivae are normal. EOMI. PERRL. ENT      Head:   Normocephalic and atraumatic.      Nose:   Wearing a mask.      Mouth/Throat:   Wearing a mask.      Neck:   No meningismus. Full ROM. Hematological/Lymphatic/Immunilogical:   No cervical lymphadenopathy. Cardiovascular:   RRR. Symmetric bilateral radial and DP pulses.  No murmurs. Cap refill less than 2 seconds. Respiratory:   Normal respiratory effort without tachypnea/retractions. Breath sounds are clear and equal bilaterally. No wheezes/rales/rhonchi. Gastrointestinal:   Soft and nontender.  Non distended. There is no CVA tenderness.  No rebound, rigidity, or guarding. Genitourinary:   deferred Musculoskeletal:   Normal range of motion in all extremities. No joint effusions.  No lower extremity tenderness.  No edema. Neurologic:   Normal speech and language.  Motor grossly intact. No acute focal neurologic deficits are appreciated.  Skin:    Skin is warm, dry and intact. No rash noted.  No petechiae, purpura, or bullae.  ____________________________________________    LABS (pertinent positives/negatives) (all labs ordered are listed, but only abnormal results are displayed) Labs Reviewed  BASIC METABOLIC PANEL - Abnormal; Notable for the following components:      Result Value   Glucose, Bld 127 (*)    All other components within normal limits  CBC - Abnormal; Notable for the following components:   Hemoglobin 16.0 (*)    HCT 46.8 (*)    All other components within normal limits  TROPONIN I (HIGH SENSITIVITY) - Abnormal; Notable for the following components:   Troponin I (High Sensitivity) 85 (*)    All other components within normal limits  TROPONIN I (HIGH SENSITIVITY) - Abnormal; Notable for the following components:   Troponin I (High Sensitivity) 163 (*)    All other components within  normal limits  PROTIME-INR  APTT  BASIC METABOLIC PANEL  LIPID PANEL  CBC  HEPARIN LEVEL (UNFRACTIONATED)   ____________________________________________   EKG  Interpreted by me Sinus bradycardia rate of 57, normal axis intervals QRS ST segments and T waves  ____________________________________________    RADIOLOGY  DG Chest 2 View  Result Date: 09/29/2020 CLINICAL DATA:  Chest pain radiating to back. Diaphoresis. Coronary artery disease. EXAM: CHEST - 2 VIEW COMPARISON:  08/21/2020 FINDINGS: The heart size and mediastinal contours are within normal limits. Coronary artery stents again noted. Both lungs are clear. The visualized skeletal structures are unremarkable. IMPRESSION: No active cardiopulmonary disease. Electronically Signed   By: Danae Orleans M.D.   On: 09/29/2020 18:33    ____________________________________________   PROCEDURES .Critical Care Performed by: Sharman Cheek, MD Authorized by: Sharman Cheek, MD   Critical care provider statement:    Critical care time (minutes):  33   Critical care time was exclusive of:  Separately billable procedures and treating other patients   Critical care was necessary to treat or prevent imminent or life-threatening deterioration of the following conditions:  Cardiac failure   Critical care was time spent personally by me on the following activities:  Development of treatment plan with patient or surrogate, discussions with consultants, evaluation of patient's response to treatment, examination of patient, obtaining history from patient or surrogate, ordering and performing treatments and interventions, ordering and review of laboratory studies, ordering and review of radiographic studies, pulse oximetry, re-evaluation of patient's condition and review of old charts    ____________________________________________  DIFFERENTIAL DIAGNOSIS   NSTEMI, pneumonia, pneumothorax, pleural effusion, pulmonary  edema  CLINICAL IMPRESSION / ASSESSMENT AND PLAN / ED COURSE  Medications ordered in the ED: Medications  atorvastatin (LIPITOR) tablet 80 mg (has no administration in time range)  aspirin EC tablet 81 mg (has no administration in time range)  isosorbide mononitrate (IMDUR) 24 hr tablet 30 mg (has no administration in time range)  losartan (COZAAR) tablet 25 mg (has no administration in time range)  metoprolol succinate (TOPROL-XL) 24 hr tablet 50 mg (has no administration in time range)  nitroGLYCERIN (NITROSTAT) SL tablet 0.4 mg (has no administration in time range)  clopidogrel (PLAVIX) tablet 75 mg (has  no administration in time range)  acetaminophen (TYLENOL) tablet 650 mg (has no administration in time range)  ondansetron (ZOFRAN) injection 4 mg (has no administration in time range)  heparin bolus via infusion 3,500 Units (has no administration in time range)  heparin ADULT infusion 100 units/mL (25000 units/272mL) (has no administration in time range)    Pertinent labs & imaging results that were available during my care of the patient were reviewed by me and considered in my medical decision making (see chart for details).  LYNISE PORR was evaluated in Emergency Department on 09/29/2020 for the symptoms described in the history of present illness. She was evaluated in the context of the global COVID-19 pandemic, which necessitated consideration that the patient might be at risk for infection with the SARS-CoV-2 virus that causes COVID-19. Institutional protocols and algorithms that pertain to the evaluation of patients at risk for COVID-19 are in a state of rapid change based on information released by regulatory bodies including the CDC and federal and state organizations. These policies and algorithms were followed during the patient's care in the ED.   Patient presents with recurrent exertional chest pain, concerning for unstable angina/NSTEMI.  Currently pain-free in the ED  after taking multiple nitroglycerin at home.  She has been compliant with therapy and has taken sufficient antiplatelet therapy today.   EKG is nonischemic.  Chest x-ray unremarkable.  Labs show uptrending troponin from initial value of 85 which represents chronic baseline to 163 on second troponin.  With her underlying cardiac disease, elevated risk, and increasing troponin, heparin ordered for non-STEMI, case discussed with hospitalist for further management.      ____________________________________________   FINAL CLINICAL IMPRESSION(S) / ED DIAGNOSES    Final diagnoses:  NSTEMI (non-ST elevated myocardial infarction) (HCC)  Precordial pain     ED Discharge Orders    None      Portions of this note were generated with dragon dictation software. Dictation errors may occur despite best attempts at proofreading.   Sharman Cheek, MD 09/29/20 2337

## 2020-09-29 NOTE — ED Triage Notes (Addendum)
Reports multiple episodes of chest pain that radiates to upper back; has taken 4 of her nitro tablets throughout the day. Reports pain with exertion for episodes after the first one, diaphoresis and relieved with nitro.  Denies CP now. Pt alert and oriented X4, cooperative, RR even and unlabored, color WNL. Pt in NAD.

## 2020-09-29 NOTE — Consult Note (Signed)
ANTICOAGULATION CONSULT NOTE  Pharmacy Consult for Heparin Infusion Indication: chest pain/ACS  Patient Measurements: Heparin Dosing Weight: 61.2 kg  Labs: Recent Labs    09/29/20 1740 09/29/20 2030  HGB 16.0*  --   HCT 46.8*  --   PLT 231  --   CREATININE 0.75  --   TROPONINIHS 85* 163*    Estimated Creatinine Clearance: 64.3 mL/min (by C-G formula based on SCr of 0.75 mg/dL).   Medical History: Past Medical History:  Diagnosis Date  . Abscess, gluteal, left    Recurrent drainage, followed by [facility]  . Arthritis   . CAD (coronary artery disease)    Stents in Clogged artery at [facility] June 2014  . Depression   . Elevated lipids   . Hyperlipidemia   . Hypertension   . Myocardial infarction [facility]) June 2014    Medications:  No anticoagulation prior to admission per my chart review  Assessment: Patient is a 59 y/o F with medical history as above who presented to the ED 12/22 with chest pain. Troponin 85 >> 163. EKG pending interpretation. Pharmacy consulted to initiate heparin infusion for ACS.  Baseline aPTT and PT-INR ordered and pending. Baseline CBC within normal limits.  Goal of Therapy:  Heparin level 0.3-0.7 units/ml Monitor platelets by anticoagulation protocol: Yes   Plan:  --Heparin 3500 unit IV bolus x 1 followed by continuous infusion at 700 units/hr --Heparin level 6 hours after initiation of infusion --Daily CBC per protocol  Tressie Ellis 09/29/2020,10:59 PM

## 2020-09-30 DIAGNOSIS — I2510 Atherosclerotic heart disease of native coronary artery without angina pectoris: Secondary | ICD-10-CM | POA: Diagnosis not present

## 2020-09-30 DIAGNOSIS — I10 Essential (primary) hypertension: Secondary | ICD-10-CM | POA: Diagnosis not present

## 2020-09-30 DIAGNOSIS — I214 Non-ST elevation (NSTEMI) myocardial infarction: Secondary | ICD-10-CM | POA: Diagnosis not present

## 2020-09-30 LAB — BASIC METABOLIC PANEL
Anion gap: 10 (ref 5–15)
BUN: 10 mg/dL (ref 6–20)
CO2: 25 mmol/L (ref 22–32)
Calcium: 9.1 mg/dL (ref 8.9–10.3)
Chloride: 104 mmol/L (ref 98–111)
Creatinine, Ser: 0.61 mg/dL (ref 0.44–1.00)
GFR, Estimated: >60 mL/min (ref >60)
Glucose, Bld: 103 mg/dL — ABNORMAL HIGH (ref 70–99)
Potassium: 3.7 mmol/L (ref 3.5–5.1)
Sodium: 139 mmol/L (ref 135–145)

## 2020-09-30 LAB — CBC
HCT: 42.6 % (ref 36.0–46.0)
Hemoglobin: 14.9 g/dL (ref 12.0–15.0)
MCH: 34.4 pg — ABNORMAL HIGH (ref 26.0–34.0)
MCHC: 35 g/dL (ref 30.0–36.0)
MCV: 98.4 fL (ref 80.0–100.0)
Platelets: 192 10*3/uL (ref 150–400)
RBC: 4.33 MIL/uL (ref 3.87–5.11)
RDW: 12.2 % (ref 11.5–15.5)
WBC: 7.4 10*3/uL (ref 4.0–10.5)
nRBC: 0 % (ref 0.0–0.2)

## 2020-09-30 LAB — PROTIME-INR
INR: 0.9 (ref 0.8–1.2)
Prothrombin Time: 12.2 seconds (ref 11.4–15.2)

## 2020-09-30 LAB — HEPARIN LEVEL (UNFRACTIONATED)
Heparin Unfractionated: 0.29 IU/mL — ABNORMAL LOW (ref 0.30–0.70)
Heparin Unfractionated: 0.38 IU/mL (ref 0.30–0.70)
Heparin Unfractionated: 0.32 IU/mL (ref 0.30–0.70)

## 2020-09-30 LAB — APTT: aPTT: 27 seconds (ref 24–36)

## 2020-09-30 LAB — LIPID PANEL
Cholesterol: 144 mg/dL (ref 0–200)
HDL: 42 mg/dL (ref >40)
LDL Cholesterol: 57 mg/dL (ref 0–99)
Total CHOL/HDL Ratio: 3.4 RATIO
Triglycerides: 226 mg/dL — ABNORMAL HIGH (ref <150)
VLDL: 45 mg/dL — ABNORMAL HIGH (ref 0–40)

## 2020-09-30 MED ORDER — MELATONIN 5 MG PO TABS
2.5000 mg | ORAL_TABLET | Freq: Every day | ORAL | Status: DC
  Administered 2020-09-30: 21:00:00 2.5 mg via ORAL
  Filled 2020-09-30: qty 1

## 2020-09-30 NOTE — ED Notes (Signed)
Pt provided breakfast tray.

## 2020-09-30 NOTE — Consult Note (Signed)
[facility] Cardiology Consultation Note  Patient ID: KENZY CAMPOVERDE, MRN: [id number], DOB/AGE: 1961/03/10 59 y.o. Admit date: 09/29/2020   Date of Consult: 09/30/2020 Primary Physician: Patient, [name] Primary Cardiologist: [place]  Chief Complaint:  Chief Complaint  Patient presents with   Chest Pain   Reason for Consult: Chest pain  HPI: 59 y.o. female with known tobacco abuse hypertension hyperlipidemia and known coronary artery disease status post previous remote stent placement of left anterior descending artery and left circumflex artery and a recent stent placement of distal right coronary artery.  The patient has had other diffuse coronary artery atherosclerosis for which has been medically managed.  The patient has been on appropriate medication management for her diffuse coronary artery disease and recent stenting and has been compliant with these medications.  The patient claims that she has been relatively active with no evidence of significant symptoms but was resting in her house watching TV when she had an acute episode of left chest discomfort radiating into her left upper back.  She did take multiple nitroglycerin which waxed and waned with the chest pain and pressure and then was seen in the emergency room.  At that time she had full relief for chest pain without any other further intervention.  The patient has now had an EKG showing normal sinus rhythm without evidence of acute changes.  Troponin has been 85 and 163.  Chest x-ray was normal and other laboratory works are normal.  Currently the patient feels well with no evidence of further symptoms.  Does not appear that would need further intervention at this time  Past Medical History:  Diagnosis Date   Abscess, gluteal, left    Recurrent drainage, followed by [facility].   Arthritis    CAD (coronary artery disease)    Stents in Clogged artery at [facility] June 2014   Depression    Elevated lipids     Hyperlipidemia    Hypertension    Myocardial infarction [facility]) June 2014      Surgical History:  Past Surgical History:  Procedure Laterality Date   CARDIAC CATHETERIZATION N/A 08/30/2016   Procedure: Left Heart Cath and Coronary Angiography;  Surgeon: Marcina Millard, MD;  Location: [facility] INVASIVE CV LAB;  Service: Cardiovascular;  Laterality: N/A;   CARDIAC SURGERY  03/30/2013   stent placement   CORONARY ANGIOPLASTY     cardiac stents x 2   CORONARY STENT INTERVENTION N/A 08/23/2020   Procedure: CORONARY STENT INTERVENTION;  Surgeon: Alwyn Pea, MD;  Location: [facility] INVASIVE CV LAB;  Service: Cardiovascular;  Laterality: N/A;   INGUINAL HERNIA REPAIR Right 02/26/2020   Procedure: HERNIA REPAIR INGUINAL ADULT;  Surgeon: Sung Amabile, DO;  Location: [facility] ORS;  Service: General;  Laterality: Right;   LEFT HEART CATH AND CORONARY ANGIOGRAPHY N/A 08/23/2020   Procedure: LEFT HEART CATH AND CORONARY ANGIOGRAPHY and possible PCI and stent;  Surgeon: Alwyn Pea, MD;  Location: [facility] INVASIVE CV LAB;  Service: Cardiovascular;  Laterality: N/A;   LEFT HEART CATH AND CORS/GRAFTS ANGIOGRAPHY N/A 02/18/2018   Procedure: LEFT HEART CATH AND CORS/GRAFTS ANGIOGRAPHY;  Surgeon: Dalia Heading, MD;  Location: [facility] INVASIVE CV LAB;  Service: Cardiovascular;  Laterality: N/A;   RECTAL SURGERY  Anal Fissure   XI ROBOTIC ASSISTED INGUINAL HERNIA REPAIR WITH MESH Right 05/16/2019   Procedure: XI ROBOTIC ASSISTED Right INGUINAL HERNIA REPAIR WITH MESH;  Surgeon: Sung Amabile, DO;  Location: [facility] ORS;  Service: General;  Laterality: Right;  Home Meds: Prior to Admission medications   Medication Sig Start Date End Date Taking? Authorizing Provider  aspirin EC 81 MG EC tablet Take 1 tablet (81 mg total) by mouth daily. Swallow whole. 08/25/20  Yes Briant Cedar, MD  atorvastatin (LIPITOR) 80 MG tablet Take 1 tablet (80 mg total) by mouth daily. 08/25/20 09/24/20 Yes Briant Cedar, MD  clopidogrel (PLAVIX) 75 MG tablet Take 75 mg by mouth daily. 09/23/20  Yes [provider]  gabapentin (NEURONTIN) 300 MG capsule Take 300 mg by mouth 3 (three) times daily. 09/23/20  Yes [provider]  isosorbide mononitrate (IMDUR) 30 MG 24 hr tablet Take 1 tablet (30 mg total) by mouth daily. 08/25/20 09/24/20 Yes Briant Cedar, MD  losartan (COZAAR) 25 MG tablet Take 1 tablet (25 mg total) by mouth daily. 08/25/20 09/24/20 Yes Briant Cedar, MD  metoprolol succinate (TOPROL-XL) 50 MG 24 hr tablet Take 1 tablet (50 mg total) by mouth daily. Take with or immediately following a meal. 08/25/20 09/24/20 Yes Briant Cedar, MD  nitroGLYCERIN (NITROSTAT) 0.4 MG SL tablet Place 1 tablet (0.4 mg total) under the tongue every 5 (five) minutes x 3 doses as needed for chest pain. 08/24/20 09/23/20 Yes Briant Cedar, MD    Inpatient Medications:   aspirin EC  81 mg Oral Daily   atorvastatin  80 mg Oral Daily   clopidogrel  75 mg Oral Daily   isosorbide mononitrate  30 mg Oral Daily   losartan  25 mg Oral Daily   metoprolol succinate  50 mg Oral Daily    heparin 900 Units/hr (09/30/20 0617)    Allergies:  Allergies  Allergen Reactions   Other Other (See Comments)    Pt states the gas used to put her to sleep made her have a bad headache, "out of it for 3 days", made patient easy to anger   Sulfamethoxazole-Trimethoprim Nausea Only   Penicillins Nausea Only    Did it involve swelling of the face/tongue/throat, SOB, or low BP? No Did it involve sudden or severe rash/hives, skin peeling, or any reaction on the inside of your mouth or nose? No Did you need to seek medical attention at a hospital or doctor's office? No When did it last happen?1990 If all above answers are "NO", may proceed with cephalosporin use.     Social History   Socioeconomic History   Marital status: Legally Separated    Spouse name: Not on  file   Number of children: Not on file   Years of education: Not on file   Highest education level: Not on file  Occupational History   Not on file  Tobacco Use   Smoking status: Current Every Day Smoker    Packs/day: 0.25    Types: Cigarettes   Smokeless tobacco: Never Used  Vaping Use   Vaping Use: Never used  Substance and Sexual Activity   Alcohol use: No    Alcohol/week: 0.0 standard drinks   Drug use: No   Sexual activity: Never  Other Topics Concern   Not on file  Social History Narrative   Not on file   Social Determinants of Health   Financial Resource Strain: Not on file  Food Insecurity: Not on file  Transportation Needs: Not on file  Physical Activity: Not on file  Stress: Not on file  Social Connections: Not on file  Intimate Partner Violence: Not on file     Family History  Problem Relation  Age of Onset   Cancer Mother        colon   Heart disease Father        Died of Heart Attack     Review of Systems Positive for chest pain shortness of breath Negative for: General:  chills, fever, night sweats or weight changes.  Cardiovascular: PND orthopnea syncope dizziness  Dermatological skin lesions rashes Respiratory: Cough congestion Urologic: Frequent urination urination at night and hematuria Abdominal: negative for nausea, vomiting, diarrhea, bright red blood per rectum, melena, or hematemesis Neurologic: negative for visual changes, and/or hearing changes  All other systems reviewed and are otherwise negative except as noted above.  Labs: No results for input(s): CKTOTAL, CKMB, TROPONINI in the last 72 hours. Lab Results  Component Value Date   WBC 7.4 09/30/2020   HGB 14.9 09/30/2020   HCT 42.6 09/30/2020   MCV 98.4 09/30/2020   PLT 192 09/30/2020    Recent Labs  Lab 09/30/20 0501  NA 139  K 3.7  CL 104  CO2 25  BUN 10  CREATININE 0.61  CALCIUM 9.1  GLUCOSE 103*   Lab Results  Component Value Date   CHOL 144  09/30/2020   HDL 42 09/30/2020   LDLCALC 57 09/30/2020   TRIG 226 (H) 09/30/2020   No results found for: DDIMER  Radiology/Studies:  DG Chest 2 View  Result Date: 09/29/2020 CLINICAL DATA:  Chest pain radiating to back. Diaphoresis. Coronary artery disease. EXAM: CHEST - 2 VIEW COMPARISON:  08/21/2020 FINDINGS: The heart size and mediastinal contours are within normal limits. Coronary artery stents again noted. Both lungs are clear. The visualized skeletal structures are unremarkable. IMPRESSION: No active cardiopulmonary disease. Electronically Signed   By: Danae Orleans M.D.   On: 09/29/2020 18:33    EKG: Normal sinus rhythm otherwise normal EKG  Weights: Filed Weights   09/29/20 1738  Weight: 61.2 kg     Physical Exam: Blood pressure 115/69, pulse 64, temperature 98.1 F (36.7 C), temperature source Oral, resp. rate 17, height 5\' 2"  (1.575 m), weight 61.2 kg, SpO2 100 %. Body mass index is 24.69 kg/m. General: Well developed, well nourished, in no acute distress. Head eyes ears nose throat: Normocephalic, atraumatic, sclera non-icteric, no xanthomas, nares are without discharge. No apparent thyromegaly and/or mass  Lungs: Normal respiratory effort.  Few diffuse wheezes, no rales, no rhonchi.  Heart: RRR with normal S1 S2. no murmur gallop, no rub, PMI is normal size and placement, carotid upstroke normal without bruit, jugular venous pressure is normal Abdomen: Soft, non-tender, non-distended with normoactive bowel sounds. No hepatomegaly. No rebound/guarding. No obvious abdominal masses. Abdominal aorta is normal size without bruit Extremities: No edema. no cyanosis, no clubbing, no ulcers  Peripheral : 2+ bilateral upper extremity pulses, 2+ bilateral femoral pulses, 2+ bilateral dorsal pedal pulse Neuro: Alert and oriented. No facial asymmetry. No focal deficit. Moves all extremities spontaneously. Musculoskeletal: Normal muscle tone without kyphosis Psych:  Responds to  questions appropriately with a normal affect.    Assessment: 59 year old female with known tobacco abuse hypertension hyperlipidemia coronary artery disease status post multiple stents with recent stenting and right coronary artery in November having episode of chest discomfort with no EKG changes but slight elevation of troponin and full relief with nitroglycerin but somewhat atypical in presentation.  Plan: 1.  Continue serial ECG and enzymes to assess for the possibility of myocardial infarction and need for further intervention and/or cardiac catheterization.  Patient understands risk and benefits of cardiac  catheterization and further intervention.  This includes the possibility of death stroke heart attack infection bleeding or blood clot.  She is at low risk for conscious sedation.  Will defer cardiac catheterization for now due to likelihood of more diffuse vessel disease with no intervention 2.  Continue heparin intravenously until further evaluation and treatment options as per above.  If patient has flat troponins and full relief of chest pain with nitrates suggesting small vessel coronary artery disease then would medically manage 3.  Continuation of high intensity cholesterol therapy with atorvastatin without change 4.  Dual antiplatelet therapy without change 5.  Metoprolol losartan for hypertension control and anginal symptoms 6.  Isosorbide for anginal symptoms and diffuse small vessel disease 7.  Begin ambulation and follow for improvements of symptoms and possible discharge to home if no further concerns of acute coronary syndrome Signed, Lamar BlinksBruce J Ashvin Adelson M.D. [facility] 09/30/2020, 10:42 AM

## 2020-09-30 NOTE — Consult Note (Signed)
ANTICOAGULATION CONSULT NOTE  Pharmacy Consult for Heparin Infusion Indication: chest pain/ACS  Patient Measurements: Heparin Dosing Weight: 61.2 kg  Labs: Recent Labs    09/29/20 1740 09/29/20 2030 09/29/20 2340 09/30/20 0501  HGB 16.0*  --   --  14.9  HCT 46.8*  --   --  42.6  PLT 231  --   --  192  APTT  --   --  27  --   LABPROT  --   --  12.2  --   INR  --   --  0.9  --   HEPARINUNFRC  --   --   --  0.29*  CREATININE 0.75  --   --  0.61  TROPONINIHS 85* 163*  --   --     Estimated Creatinine Clearance: 64.3 mL/min (by C-G formula based on SCr of 0.61 mg/dL).   Medical History: Past Medical History:  Diagnosis Date  . Abscess, gluteal, left    Recurrent drainage, followed by [facility].  . Arthritis   . CAD (coronary artery disease)    Stents in Clogged artery at [facility] June 2014  . Depression   . Elevated lipids   . Hyperlipidemia   . Hypertension   . Myocardial infarction [facility]) June 2014    Medications:  No anticoagulation prior to admission per my chart review  Assessment: Patient is a 59 y/o F with medical history as above who presented to the ED 12/22 with chest pain. Troponin 85 >> 163. EKG pending interpretation. Pharmacy consulted to initiate heparin infusion for ACS.  Baseline aPTT and PT-INR ordered and pending. Baseline CBC within normal limits.  Goal of Therapy:  Heparin level 0.3-0.7 units/ml Monitor platelets by anticoagulation protocol: Yes   Plan:  --Heparin 3500 unit IV bolus x 1 followed by continuous infusion at 700 units/hr --Heparin level 6 hours after initiation of infusion --Daily CBC per protocol  1223 0501 HL 0.29, barely subtherapeutic.  Will increase Heparin infusion to 900 units/hr and recheck HL in 6 hrs  Margo Aye, Florentino Laabs A 09/30/2020,5:43 AM

## 2020-09-30 NOTE — Consult Note (Signed)
ANTICOAGULATION CONSULT NOTE  Pharmacy Consult for Heparin Infusion Indication: chest pain/ACS  Patient Measurements: Heparin Dosing Weight: 61.2 kg  Labs: Recent Labs    09/29/20 1740 09/29/20 2030 09/29/20 2340 09/30/20 0501 09/30/20 1421  HGB 16.0*  --   --  14.9  --   HCT 46.8*  --   --  42.6  --   PLT 231  --   --  192  --   APTT  --   --  27  --   --   LABPROT  --   --  12.2  --   --   INR  --   --  0.9  --   --   HEPARINUNFRC  --   --   --  0.29* 0.38  CREATININE 0.75  --   --  0.61  --   TROPONINIHS 85* 163*  --   --   --     Estimated Creatinine Clearance: 64.3 mL/min (by C-G formula based on SCr of 0.61 mg/dL).   Medical History: Past Medical History:  Diagnosis Date  . Abscess, gluteal, left    Recurrent drainage, followed by [facility].  . Arthritis   . CAD (coronary artery disease)    Stents in Clogged artery at [facility] June 2014  . Depression   . Elevated lipids   . Hyperlipidemia   . Hypertension   . Myocardial infarction [facility]) June 2014    Medications:  No anticoagulation prior to admission per my chart review  Assessment: Patient is a 59 y/o F with medical history as above who presented to the ED 12/22 with chest pain. Troponin 85 >> 163. EKG pending interpretation. Pharmacy consulted to initiate heparin infusion for ACS.  Baseline aPTT and PT-INR ordered and pending. Baseline CBC within normal limits.  1223 0501 HL 0.29, rate increased to 900units/hr 1223 1421 HL 0.38, therapeutic x 1  Goal of Therapy:  Heparin level 0.3-0.7 units/ml Monitor platelets by anticoagulation protocol: Yes   Plan:  --Heparin level is therapeutic, will continue Heparin infusion at 900 units/hr --Will check Heparin level in 6 hours for confirmation. --Daily CBC per protocol  Clovia Cuff, PharmD, BCPS 09/30/2020 2:43 PM

## 2020-09-30 NOTE — Progress Notes (Signed)
PROGRESS NOTE    Sylvia Wiggins  KZL:[id number] DOB: [birth year] DOA: 09/29/2020 PCP: Patient, [name]   Chief complaint.  Chest pain Brief Narrative:  Sylvia Wiggins is a 59 y.o. female with medical history significant for Multivessel CAD status post DES to mid RCA on 08/23/20 and on dual antiplatelet therapy, as well as history of HTN, HLD, depression and history of nicotine dependence who quit smoking a month ago, who presents to the emergency room after having four episodes of retrosternal chest pain radiating to the upper back, resolved with sublingual nitroglycerin after 10 minutes.  Took a total of 4 tablets throughout the day.    Her peak troponin was 163, she has been seen by cardiology, did not recommend intervention at this time.   Assessment & Plan:   Principal Problem:   NSTEMI (non-ST elevated myocardial infarction) (HCC) Active Problems:   CAD (coronary artery disease)   Hypertension   Depression   Hyperlipidemia   Nicotine dependence  #1.  Non-STEMI. Multivessel artery disease. Patient has known coronary disease from prior angiogram. Peak troponin was 63. Been seen by cardiology, continue on heparin drip for another 24 hours. Continue beta-blocker, isosorbide, high-dose Lipitor and dual antiplatelet treatment. Recheck troponin tomorrow, most likely discharge home tomorrow if condition does not worse.  2.  Essential hypertension. Continue home medicines.    DVT prophylaxis: Heparin Code Status: Full Family Communication: None Disposition Plan:  .   Status is: Observation  The patient remains OBS appropriate and will d/c before 2 midnights.  Dispo: The patient is from: Home              Anticipated d/c is to: Home              Anticipated d/c date is: 1 day              Patient currently is not medically stable to d/c.        No intake/output data recorded. No intake/output data recorded.     Consultants:   Cardiology  Procedures:  none  Antimicrobials: None Subjective: Patient did not have additional chest pain since admission.  No short of breath. No abdominal pain or nausea vomiting. No fever chills. No dysuria hematuria.  Objective: Vitals:   09/30/20 1400 09/30/20 1430 09/30/20 1500 09/30/20 1515  BP: 106/67     Pulse: (!) 56 (!) 53 (!) 55 67  Resp: 12 11 15 16   Temp:      TempSrc:      SpO2: 94% 97% 96% 94%  Weight:      Height:       No intake or output data in the 24 hours ending 09/30/20 1604 Filed Weights   09/29/20 1738  Weight: 61.2 kg    Examination:  General exam: Appears calm and comfortable  Respiratory system: Clear to auscultation. Respiratory effort normal. Cardiovascular system: S1 & S2 heard, RRR. No JVD, murmurs, rubs, gallops or clicks. No pedal edema. Gastrointestinal system: Abdomen is nondistended, soft and nontender. No organomegaly or masses felt. Normal bowel sounds heard. Central nervous system: Alert and oriented. No focal neurological deficits. Extremities: Symmetric 5 x 5 power. Skin: No rashes, lesions or ulcers Psychiatry:  Mood & affect appropriate.     Data Reviewed: I have personally reviewed following labs and imaging studies  CBC: Recent Labs  Lab 09/29/20 1740 09/30/20 0501  WBC 9.6 7.4  HGB 16.0* 14.9  HCT 46.8* 42.6  MCV 98.7 98.4  PLT  231 192   Basic Metabolic Panel: Recent Labs  Lab 09/29/20 1740 09/30/20 0501  NA 139 139  K 3.9 3.7  CL 105 104  CO2 23 25  GLUCOSE 127* 103*  BUN 9 10  CREATININE 0.75 0.61  CALCIUM 9.4 9.1   GFR: Estimated Creatinine Clearance: 64.3 mL/min (by C-G formula based on SCr of 0.61 mg/dL). Liver Function Tests: No results for input(s): AST, ALT, ALKPHOS, BILITOT, PROT, ALBUMIN in the last 168 hours. No results for input(s): LIPASE, AMYLASE in the last 168 hours. No results for input(s): AMMONIA in the last 168 hours. Coagulation Profile: Recent Labs  Lab 09/29/20 2340  INR 0.9   Cardiac  Enzymes: No results for input(s): CKTOTAL, CKMB, CKMBINDEX, TROPONINI in the last 168 hours. BNP (last 3 results) No results for input(s): PROBNP in the last 8760 hours. HbA1C: No results for input(s): HGBA1C in the last 72 hours. CBG: No results for input(s): GLUCAP in the last 168 hours. Lipid Profile: Recent Labs    09/30/20 0501  CHOL 144  HDL 42  LDLCALC 57  TRIG 226*  CHOLHDL 3.4   Thyroid Function Tests: No results for input(s): TSH, T4TOTAL, FREET4, T3FREE, THYROIDAB in the last 72 hours. Anemia Panel: No results for input(s): VITAMINB12, FOLATE, FERRITIN, TIBC, IRON, RETICCTPCT in the last 72 hours. Sepsis Labs: No results for input(s): PROCALCITON, LATICACIDVEN in the last 168 hours.  No results found for this or any previous visit (from the past 240 hour(s)).       Radiology Studies: DG Chest 2 View  Result Date: 09/29/2020 CLINICAL DATA:  Chest pain radiating to back. Diaphoresis. Coronary artery disease. EXAM: CHEST - 2 VIEW COMPARISON:  08/21/2020 FINDINGS: The heart size and mediastinal contours are within normal limits. Coronary artery stents again noted. Both lungs are clear. The visualized skeletal structures are unremarkable. IMPRESSION: No active cardiopulmonary disease. Electronically Signed   By: Danae Orleans M.D.   On: 09/29/2020 18:33        Scheduled Meds: . aspirin EC  81 mg Oral Daily  . atorvastatin  80 mg Oral Daily  . clopidogrel  75 mg Oral Daily  . isosorbide mononitrate  30 mg Oral Daily  . losartan  25 mg Oral Daily  . melatonin  2.5 mg Oral QHS  . metoprolol succinate  50 mg Oral Daily   Continuous Infusions: . heparin 900 Units/hr (09/30/20 0617)     LOS: 0 days    Time spent: 27 minutes    Marrion Coy, MD [facility]   To contact the attending provider between 7A-7P or the covering provider during after hours 7P-7A, please log into the web site [email or web address] and access using universal [place] password  for that web site. If you do not have the password, please call the hospital operator.  09/30/2020, 4:04 PM

## 2020-09-30 NOTE — Consult Note (Signed)
ANTICOAGULATION CONSULT NOTE  Pharmacy Consult for Heparin Infusion Indication: chest pain/ACS  Patient Measurements: Heparin Dosing Weight: 61.2 kg  Labs: Recent Labs    09/29/20 1740 09/29/20 2030 09/29/20 2340 09/30/20 0501 09/30/20 1421 09/30/20 2041  HGB 16.0*  --   --  14.9  --   --   HCT 46.8*  --   --  42.6  --   --   PLT 231  --   --  192  --   --   APTT  --   --  27  --   --   --   LABPROT  --   --  12.2  --   --   --   INR  --   --  0.9  --   --   --   HEPARINUNFRC  --   --   --  0.29* 0.38 0.32  CREATININE 0.75  --   --  0.61  --   --   TROPONINIHS 85* 163*  --   --   --   --     Estimated Creatinine Clearance: 64.3 mL/min (by C-G formula based on SCr of 0.61 mg/dL).   Medical History: Past Medical History:  Diagnosis Date  . Abscess, gluteal, left    Recurrent drainage, followed by [facility].  . Arthritis   . CAD (coronary artery disease)    Stents in Clogged artery at [facility] June 2014  . Depression   . Elevated lipids   . Hyperlipidemia   . Hypertension   . Myocardial infarction [facility]) June 2014    Medications:  No anticoagulation prior to admission per my chart review  Assessment: Patient is a 59 y/o F with medical history as above who presented to the ED 12/22 with chest pain. Troponin 85 >> 163. EKG pending interpretation. Pharmacy consulted to initiate heparin infusion for ACS.  Baseline aPTT and PT-INR ordered and pending. Baseline CBC within normal limits.  1223 0501 HL 0.29, rate increased to 900units/hr 1223 1421 HL 0.38, therapeutic x 1 1223 2041 HL 0.32, therapeutic X 2   Goal of Therapy:  Heparin level 0.3-0.7 units/ml Monitor platelets by anticoagulation protocol: Yes   Plan:  12/23:  HL @ 2041 = 0.32 Will continue pt on current rate and recheck HL on 12/24 with AM labs.  --Daily CBC per protocol  Ninoshka Wainwright D 09/30/2020 9:28 PM

## 2020-10-01 ENCOUNTER — Other Ambulatory Visit: Payer: Self-pay

## 2020-10-01 DIAGNOSIS — I2510 Atherosclerotic heart disease of native coronary artery without angina pectoris: Secondary | ICD-10-CM | POA: Diagnosis not present

## 2020-10-01 DIAGNOSIS — I10 Essential (primary) hypertension: Secondary | ICD-10-CM | POA: Diagnosis not present

## 2020-10-01 DIAGNOSIS — I214 Non-ST elevation (NSTEMI) myocardial infarction: Secondary | ICD-10-CM | POA: Diagnosis not present

## 2020-10-01 LAB — CBC
HCT: 43.4 % (ref 36.0–46.0)
Hemoglobin: 15 g/dL (ref 12.0–15.0)
MCH: 33.9 pg (ref 26.0–34.0)
MCHC: 34.6 g/dL (ref 30.0–36.0)
MCV: 98.2 fL (ref 80.0–100.0)
Platelets: 179 10*3/uL (ref 150–400)
RBC: 4.42 MIL/uL (ref 3.87–5.11)
RDW: 12 % (ref 11.5–15.5)
WBC: 6.4 10*3/uL (ref 4.0–10.5)
nRBC: 0 % (ref 0.0–0.2)

## 2020-10-01 LAB — HEPARIN LEVEL (UNFRACTIONATED): Heparin Unfractionated: 0.47 IU/mL (ref 0.30–0.70)

## 2020-10-01 LAB — TROPONIN I (HIGH SENSITIVITY): Troponin I (High Sensitivity): 96 ng/L — ABNORMAL HIGH (ref <18)

## 2020-10-01 NOTE — Consult Note (Signed)
ANTICOAGULATION CONSULT NOTE  Pharmacy Consult for Heparin Infusion Indication: chest pain/ACS  Patient Measurements: Heparin Dosing Weight: 61.2 kg  Labs: Recent Labs    09/29/20 1740 09/29/20 1740 09/29/20 2030 09/29/20 2340 09/30/20 0501 09/30/20 1421 09/30/20 2041 10/01/20 0530  HGB 16.0*  --   --   --  14.9  --   --  15.0  HCT 46.8*  --   --   --  42.6  --   --  43.4  PLT 231  --   --   --  192  --   --  179  APTT  --   --   --  27  --   --   --   --   LABPROT  --   --   --  12.2  --   --   --   --   INR  --   --   --  0.9  --   --   --   --   HEPARINUNFRC  --    < >  --   --  0.29* 0.38 0.32 0.47  CREATININE 0.75  --   --   --  0.61  --   --   --   TROPONINIHS 85*  --  163*  --   --   --   --   --    < > = values in this interval not displayed.    Estimated Creatinine Clearance: 64.3 mL/min (by C-G formula based on SCr of 0.61 mg/dL).   Medical History: Past Medical History:  Diagnosis Date  . Abscess, gluteal, left    Recurrent drainage, followed by [facility].  . Arthritis   . CAD (coronary artery disease)    Stents in Clogged artery at [facility] June 2014  . Depression   . Elevated lipids   . Hyperlipidemia   . Hypertension   . Myocardial infarction [facility]) June 2014    Medications:  No anticoagulation prior to admission per my chart review  Assessment: Patient is a 59 y/o F with medical history as above who presented to the ED 12/22 with chest pain. Troponin 85 >> 163. EKG pending interpretation. Pharmacy consulted to initiate heparin infusion for ACS.  Baseline aPTT and PT-INR ordered and pending. Baseline CBC within normal limits.  1223 0501 HL 0.29, rate increased to 900units/hr 1223 1421 HL 0.38, therapeutic x 1 1223 2041 HL 0.32, therapeutic X 2  1224 0530 HL 0.47, therapeutic x 3  Goal of Therapy:  Heparin level 0.3-0.7 units/ml Monitor platelets by anticoagulation protocol: Yes   Plan:  Will continue pt on current rate and recheck HL with AM  labs.  --Daily CBC per protocol  Valrie Hart A 10/01/2020 6:31 AM

## 2020-10-01 NOTE — Progress Notes (Addendum)
[facility] Cardiology [facility] Encounter Note  Patient: ROSHONDA Wiggins / Admit Date: 09/29/2020 / Date of Encounter: 10/01/2020, 7:45 AM   Subjective: 12/24. Patient overall feels great today. She had no evidence of chest discomfort since her admission. The chest discomfort was relieved by nitroglycerin before she got to the hospital and is now continuing to be improved. Troponin levels have been 85/163/96 all consistent with possible unstable angina and/or minimal myocardial injury. EKG does have a transition with now a normal sinus rhythm with inferior lateral T wave inversion consistent with possible inferior lateral injury. The patient has been on appropriate medication management and otherwise feels well . WE have discussed at lenth ablut appropirate mgt of above including meds and othr diagnositcs. She understands all of above but wishes to go home despite  Therefore, will plan on maximizing treatment and fu early in week  Review of Systems: Positive for: None Negative for: Vision change, hearing change, syncope, dizziness, nausea, vomiting,diarrhea, bloody stool, stomach pain, cough, congestion, diaphoresis, urinary frequency, urinary pain,skin lesions, skin rashes Others previously listed  Objective: Telemetry: Normal sinus rhythm Physical Exam: Blood pressure (!) 158/77, pulse (!) 56, temperature 98.2 F (36.8 C), temperature source Oral, resp. rate 14, height 5\' 2"  (1.575 m), weight 61.2 kg, SpO2 96 %. Body mass index is 24.69 kg/m. General: Well developed, well nourished, in no acute distress. Head: Normocephalic, atraumatic, sclera non-icteric, no xanthomas, nares are without discharge. Neck: No apparent masses Lungs: Normal respirations with no wheezes, no rhonchi, no rales , no crackles   Heart: Regular rate and rhythm, normal S1 S2, no murmur, no rub, no gallop, PMI is normal size and placement, carotid upstroke normal without bruit, jugular venous pressure normal Abdomen:  Soft, non-tender, non-distended with normoactive bowel sounds. No hepatosplenomegaly. Abdominal aorta is normal size without bruit Extremities: No edema, no clubbing, no cyanosis, no ulcers,  Peripheral: 2+ radial, 2+ femoral, 2+ dorsal pedal pulses Neuro: Alert and oriented. Moves all extremities spontaneously. Psych:  Responds to questions appropriately with a normal affect.  No intake or output data in the 24 hours ending 10/01/20 0745  Inpatient Medications:  . aspirin EC  81 mg Oral Daily  . atorvastatin  80 mg Oral Daily  . clopidogrel  75 mg Oral Daily  . isosorbide mononitrate  30 mg Oral Daily  . losartan  25 mg Oral Daily  . melatonin  2.5 mg Oral QHS  . metoprolol succinate  50 mg Oral Daily   Infusions:  . heparin 900 Units/hr (10/01/20 0741)    Labs: Recent Labs    09/29/20 1740 09/30/20 0501  NA 139 139  K 3.9 3.7  CL 105 104  CO2 23 25  GLUCOSE 127* 103*  BUN 9 10  CREATININE 0.75 0.61  CALCIUM 9.4 9.1   No results for input(s): AST, ALT, ALKPHOS, BILITOT, PROT, ALBUMIN in the last 72 hours. Recent Labs    09/30/20 0501 10/01/20 0530  WBC 7.4 6.4  HGB 14.9 15.0  HCT 42.6 43.4  MCV 98.4 98.2  PLT 192 179   No results for input(s): CKTOTAL, CKMB, TROPONINI in the last 72 hours. Invalid input(s): POCBNP No results for input(s): HGBA1C in the last 72 hours.   Weights: Filed Weights   09/29/20 1738  Weight: 61.2 kg     Radiology/Studies:  DG Chest 2 View  Result Date: 09/29/2020 CLINICAL DATA:  Chest pain radiating to back. Diaphoresis. Coronary artery disease. EXAM: CHEST - 2 VIEW COMPARISON:  08/21/2020  FINDINGS: The heart size and mediastinal contours are within normal limits. Coronary artery stents again noted. Both lungs are clear. The visualized skeletal structures are unremarkable. IMPRESSION: No active cardiopulmonary disease. Electronically Signed   By: Danae Orleans M.D.   On: 09/29/2020 18:33     Assessment and Recommendation  59  y.o. female with known coronary artery disease hypertension hyperlipidemia status post PCI and stent placement right coronary artery last month now with acute onset of chest discomfort improved with appropriate medication management including heparin now consistent with myocardial injury and EKG changes without evidence of congestive heart failure 1. Had a long discussion with the patient about current issues and concerns for recent cardiovascular event and still wishes to try medical mgt and no intervention and or cath at this time  2. Will further consideration of cardiac catheterization to assess coronary anatomy including possible PCI and stent placement if pateint has wosenting sx after ambualtion and dc of heparin Patient understands risk and benefits of cardiac catheterization. This includes possibility death stroke heart attack infection bleeding or blood clot. She is a low risk for conscious sedation 3. Continue high intensity cholesterol therapy, hypertension therapy, isosorbide therapy, dual antiplatelet therapy for previous coronary artery disease 4. Begin ambulation and follow for improvements of symptoms and other possible treatment options as per above  Signed, Arnoldo Hooker M.D. FACC

## 2020-10-01 NOTE — Discharge Summary (Signed)
Physician Discharge Summary  Patient ID: Sylvia Wiggins MRN: [id number] DOB/AGE: 06-16-1961 59 y.o.  Admit date: 09/29/2020 Discharge date: 10/01/2020  Admission Diagnoses:  Discharge Diagnoses:  Principal Problem:   NSTEMI (non-ST elevated myocardial infarction) [facility]) Active Problems:   CAD (coronary artery disease)   Hypertension   Depression   Hyperlipidemia   Nicotine dependence   Discharged Condition: good  Hospital Course:  Sylvia Wiggins a 59 y.o.femalewith medical history significant forMultivessel CAD status post DES to mid RCA on 08/23/20 and on dual antiplatelet therapy, as well as history of HTN, HLD, depression andhistory of nicotine dependence who quit smoking a month ago, who presents to the emergency roomafter having fourepisodes of retrosternal chest pain radiating to the upper back, resolved with sublingual nitroglycerinafter 10 minutes. Took a total of 4 tablets throughout the day.   Her peak troponin was 163, she has been seen by cardiology, did not recommend intervention at this time.  #1.  Non-STEMI. Multivessel artery disease. Patient has known coronary disease from prior angiogram. Peak troponin was 163. Been seen by cardiology, treated with IV heparin for 2 days. Continued beta-blocker, isosorbide, high-dose Lipitor and dual antiplatelet treatment. Recheck a troponin has came down to 96 from 163 yesterday. I was messaged by Dr. Gwen Pounds,  he wanted the patient to go home today, follow-up with cardiology in the office for possible work-up.  2.  Essential hypertension. Continue home medicines.    Consults: cardiology  Significant Diagnostic Studies:   Treatments: Heparin drip  Discharge Exam: Blood pressure (!) 149/93, pulse 63, temperature 98.2 F (36.8 C), temperature source Oral, resp. rate 13, height 5\' 2"  (1.575 m), weight 61.2 kg, SpO2 96 %. General appearance: alert and cooperative Resp: clear to auscultation  bilaterally Cardio: regular rate and rhythm, S1, S2 normal, no murmur, click, rub or gallop GI: soft, non-tender; bowel sounds normal; no masses,  no organomegaly Extremities: extremities normal, atraumatic, no cyanosis or edema  Disposition: Discharge disposition: 01-Home or Self Care       Discharge Instructions    Diet - low sodium heart healthy   Complete by: As directed    Increase activity slowly   Complete by: As directed      Allergies as of 10/01/2020      Reactions   Other Other (See Comments)   Pt states the gas used to put her to sleep made her have a bad headache, "out of it for 3 days", made patient easy to anger   Sulfamethoxazole-trimethoprim Nausea Only   Penicillins Nausea Only   Did it involve swelling of the face/tongue/throat, SOB, or low BP? No Did it involve sudden or severe rash/hives, skin peeling, or any reaction on the inside of your mouth or nose? No Did you need to seek medical attention at a hospital or doctor's office? No When did it last happen?1990 If all above answers are "NO", may proceed with cephalosporin use.      Medication List    TAKE these medications   aspirin 81 MG EC tablet Take 1 tablet (81 mg total) by mouth daily. Swallow whole.   atorvastatin 80 MG tablet Commonly known as: LIPITOR Take 1 tablet (80 mg total) by mouth daily.   clopidogrel 75 MG tablet Commonly known as: PLAVIX Take 75 mg by mouth daily.   gabapentin 300 MG capsule Commonly known as: NEURONTIN Take 300 mg by mouth 3 (three) times daily.   isosorbide mononitrate 30 MG 24 hr tablet Commonly known as: IMDUR  Take 1 tablet (30 mg total) by mouth daily.   losartan 25 MG tablet Commonly known as: COZAAR Take 1 tablet (25 mg total) by mouth daily.   metoprolol succinate 50 MG 24 hr tablet Commonly known as: TOPROL-XL Take 1 tablet (50 mg total) by mouth daily. Take with or immediately following a meal.   nitroGLYCERIN 0.4 MG SL  tablet Commonly known as: NITROSTAT Place 1 tablet (0.4 mg total) under the tongue every 5 (five) minutes x 3 doses as needed for chest pain.       Follow-up Information    Callwood, Dwayne D, MD Follow up in 1 week(s).   Specialties: Cardiology, Internal Medicine Contact information: [address] [place] [address] [phone]               Signed: Marrion Coy 10/01/2020, 12:25 PM

## 2020-10-01 NOTE — Discharge Instructions (Signed)
1.  Come back to see Dr. Juliann Pares as scheduled. 2.  Need to come back to the hospital if chest pain recurs.

## 2021-02-20 IMAGING — CR DG CHEST 2V
2 series · 2 of 2 positions shown · non-contrast
Comparison: February 16, 2018

CLINICAL DATA: Chest pain

EXAM:
CHEST - 2 VIEW

[chest pa]
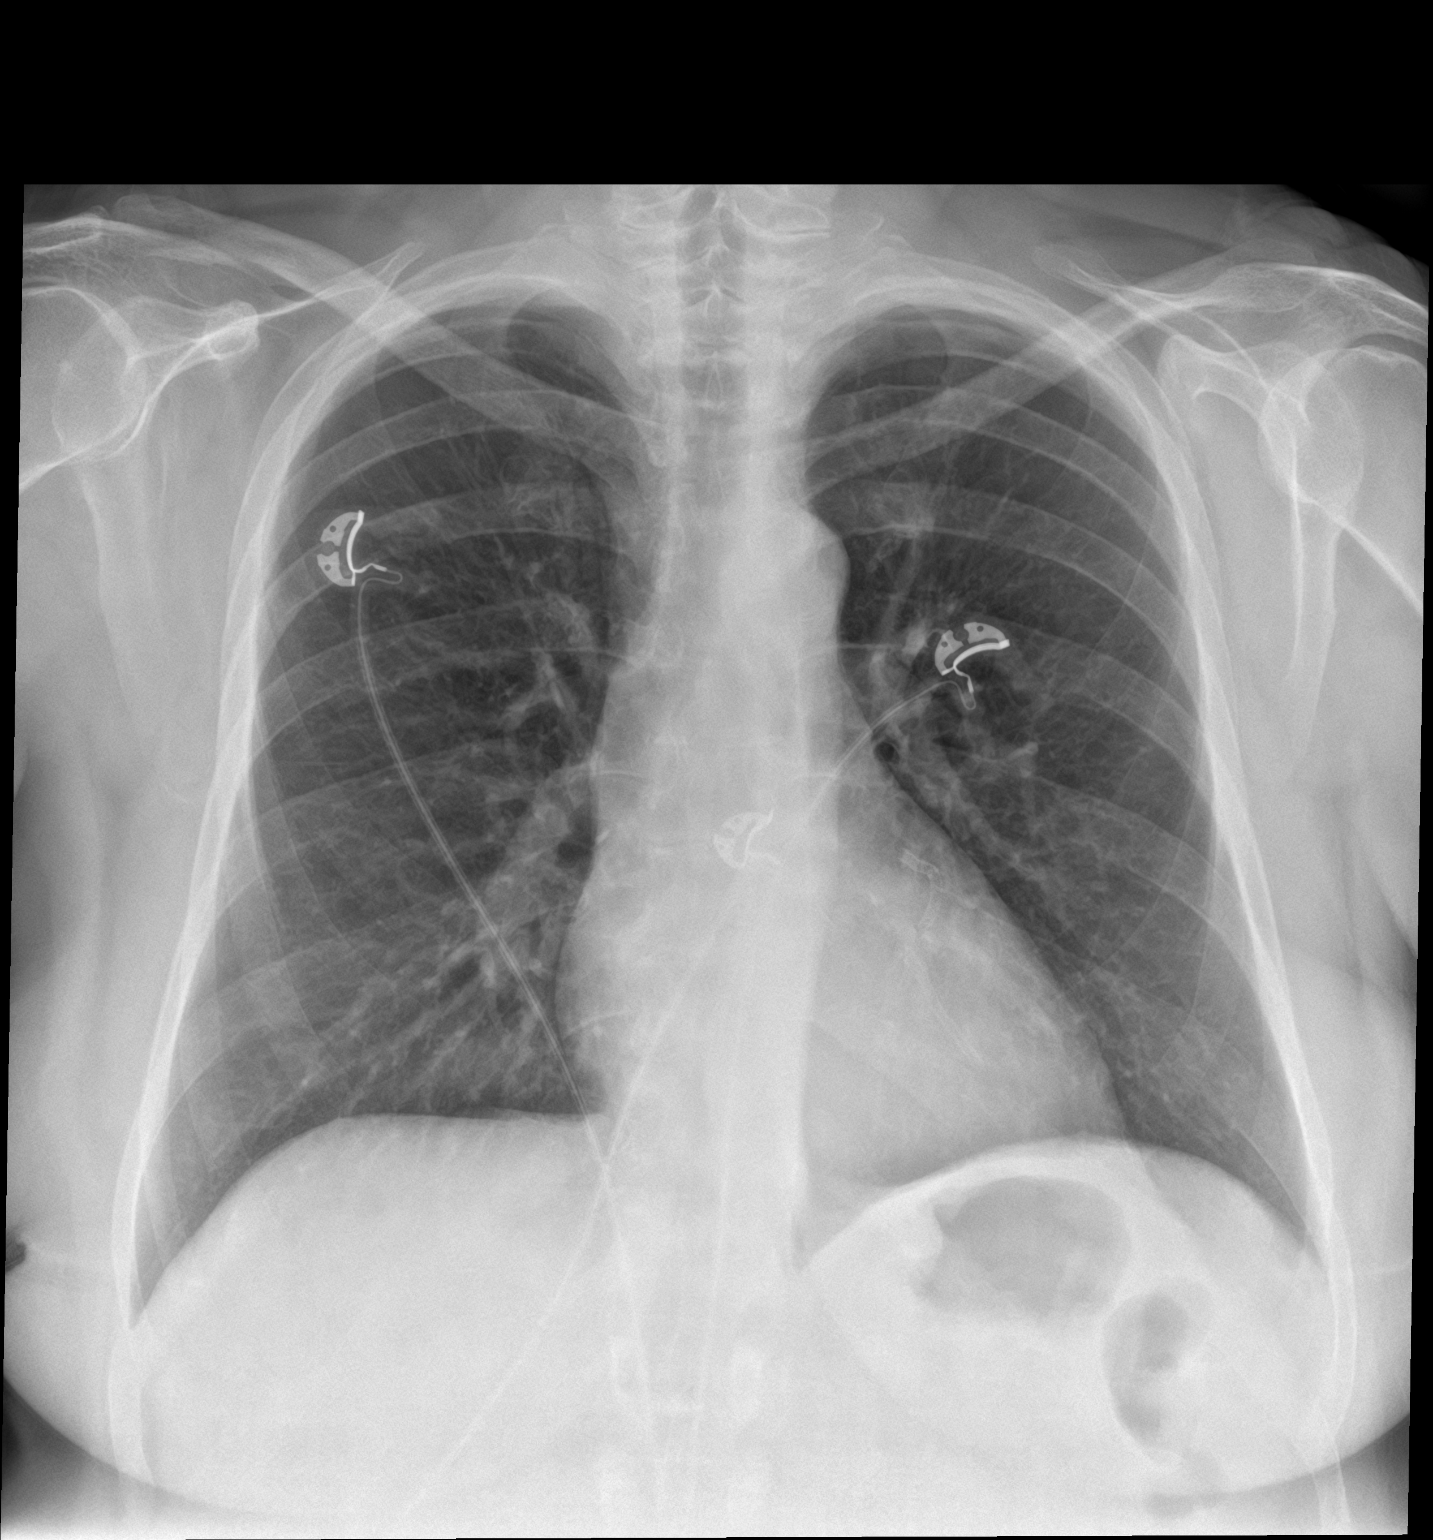

[chest lat]
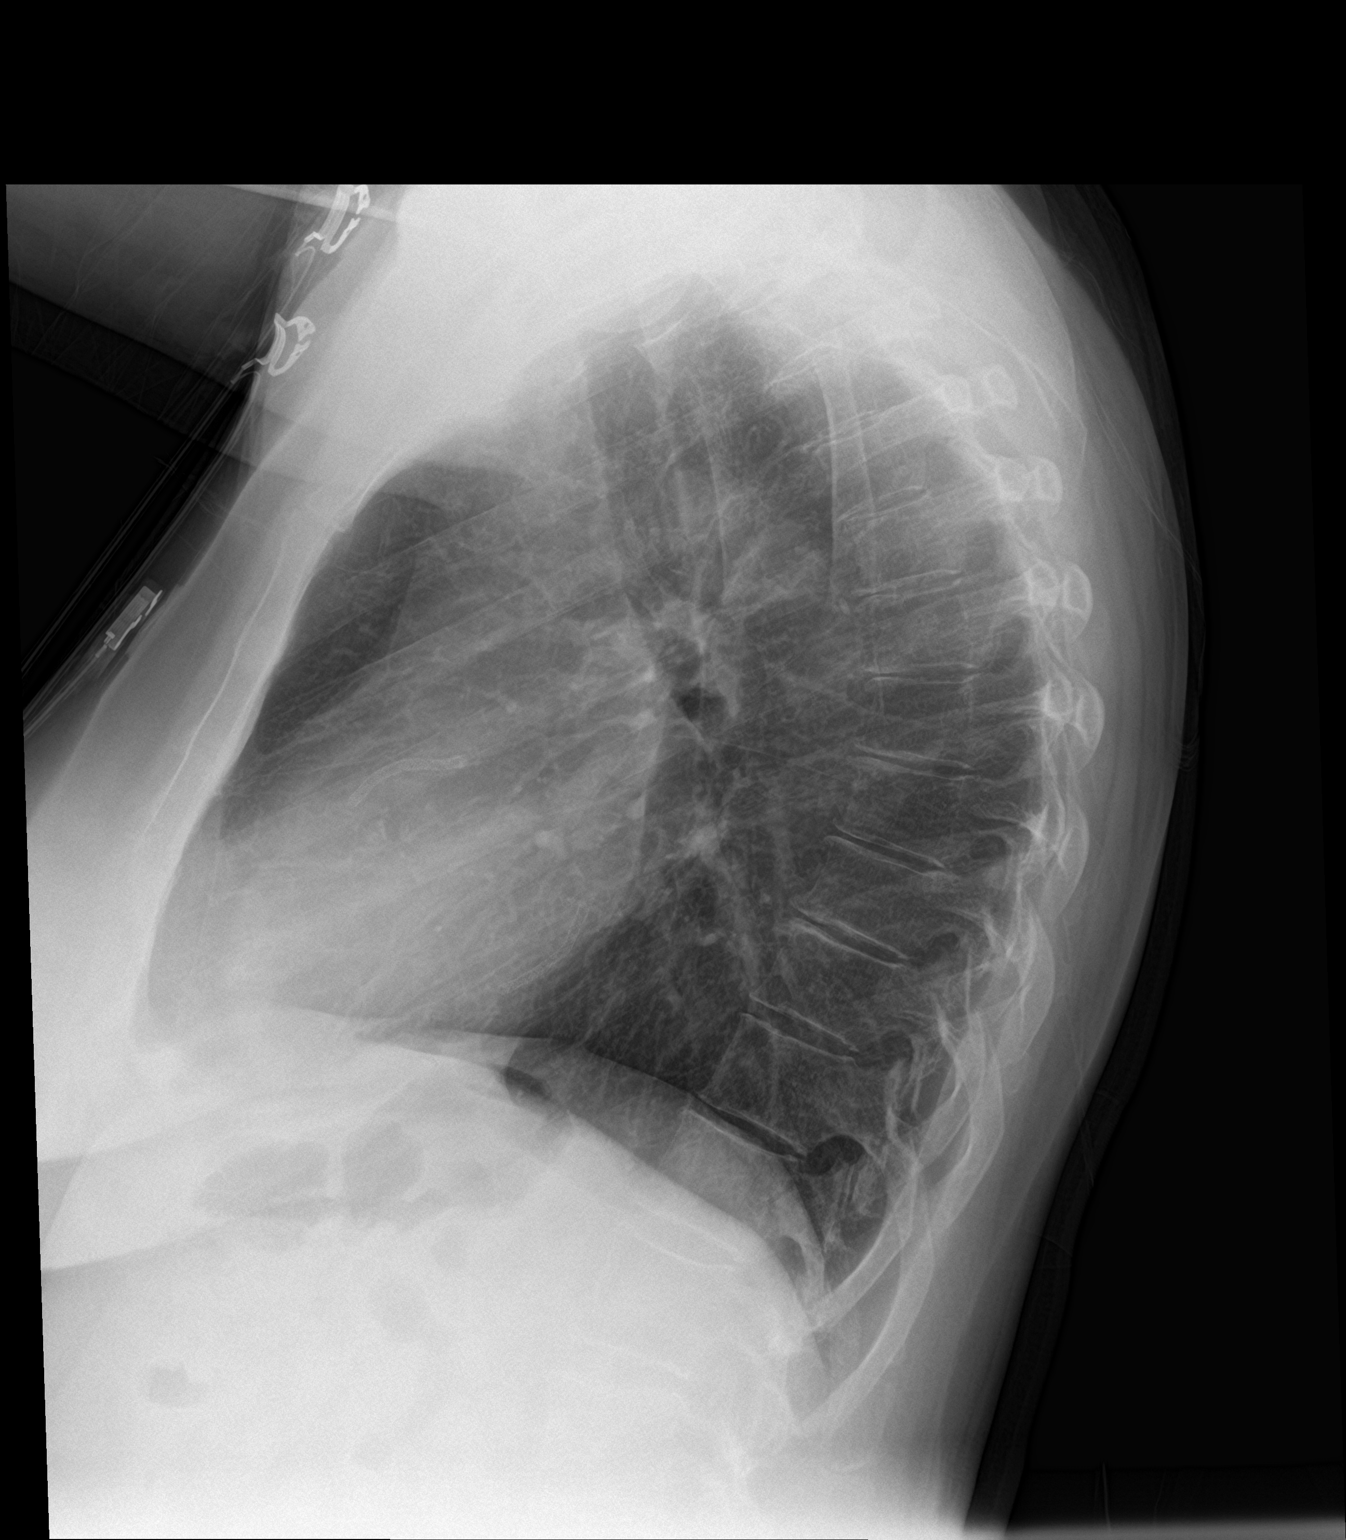

[2 of 2 positions shown; findings below may reference images not displayed]

FINDINGS: Lungs are clear. Heart size and pulmonary vascularity are normal. No
adenopathy. There is an apparent stent in the left anterior
descending coronary artery. No pneumothorax. No bone lesions.
IMPRESSION: Stent in left anterior descending coronary artery. Heart size
normal. Lungs clear.

## 2021-07-30 ENCOUNTER — Emergency Department: Payer: Medicare HMO

## 2021-07-30 ENCOUNTER — Other Ambulatory Visit: Payer: Self-pay

## 2021-07-30 ENCOUNTER — Encounter: Payer: Self-pay | Admitting: Family Medicine

## 2021-07-30 ENCOUNTER — Inpatient Hospital Stay
Admission: EM | Admit: 2021-07-30 | Discharge: 2021-08-01 | DRG: 247 | Disposition: A | Discharge status: Home / Self Care | Payer: Medicare HMO | Attending: Internal Medicine | Admitting: Internal Medicine

## 2021-07-30 ENCOUNTER — Inpatient Hospital Stay: Payer: Medicare HMO

## 2021-07-30 ENCOUNTER — Encounter
Admission: EM | Disposition: A | Discharge status: Home / Self Care | Payer: Self-pay | Source: Home / Self Care | Attending: Internal Medicine

## 2021-07-30 DIAGNOSIS — F329 Major depressive disorder, single episode, unspecified: Secondary | ICD-10-CM | POA: Diagnosis present

## 2021-07-30 DIAGNOSIS — D649 Anemia, unspecified: Secondary | ICD-10-CM | POA: Diagnosis present

## 2021-07-30 DIAGNOSIS — I5032 Chronic diastolic (congestive) heart failure: Secondary | ICD-10-CM | POA: Diagnosis present

## 2021-07-30 DIAGNOSIS — I5033 Acute on chronic diastolic (congestive) heart failure: Secondary | ICD-10-CM | POA: Diagnosis not present

## 2021-07-30 DIAGNOSIS — M1990 Unspecified osteoarthritis, unspecified site: Secondary | ICD-10-CM | POA: Diagnosis present

## 2021-07-30 DIAGNOSIS — I213 ST elevation (STEMI) myocardial infarction of unspecified site: Secondary | ICD-10-CM | POA: Diagnosis not present

## 2021-07-30 DIAGNOSIS — I10 Essential (primary) hypertension: Secondary | ICD-10-CM | POA: Diagnosis not present

## 2021-07-30 DIAGNOSIS — F17210 Nicotine dependence, cigarettes, uncomplicated: Secondary | ICD-10-CM | POA: Diagnosis present

## 2021-07-30 DIAGNOSIS — Z8249 Family history of ischemic heart disease and other diseases of the circulatory system: Secondary | ICD-10-CM | POA: Diagnosis not present

## 2021-07-30 DIAGNOSIS — Z955 Presence of coronary angioplasty implant and graft: Secondary | ICD-10-CM | POA: Diagnosis not present

## 2021-07-30 DIAGNOSIS — G8929 Other chronic pain: Secondary | ICD-10-CM | POA: Diagnosis present

## 2021-07-30 DIAGNOSIS — Z880 Allergy status to penicillin: Secondary | ICD-10-CM | POA: Diagnosis not present

## 2021-07-30 DIAGNOSIS — E785 Hyperlipidemia, unspecified: Secondary | ICD-10-CM | POA: Diagnosis present

## 2021-07-30 DIAGNOSIS — R739 Hyperglycemia, unspecified: Secondary | ICD-10-CM | POA: Diagnosis present

## 2021-07-30 DIAGNOSIS — J9601 Acute respiratory failure with hypoxia: Secondary | ICD-10-CM | POA: Diagnosis not present

## 2021-07-30 DIAGNOSIS — Z79899 Other long term (current) drug therapy: Secondary | ICD-10-CM

## 2021-07-30 DIAGNOSIS — Z7902 Long term (current) use of antithrombotics/antiplatelets: Secondary | ICD-10-CM | POA: Diagnosis not present

## 2021-07-30 DIAGNOSIS — Z20822 Contact with and (suspected) exposure to covid-19: Secondary | ICD-10-CM | POA: Diagnosis present

## 2021-07-30 DIAGNOSIS — I2129 ST elevation (STEMI) myocardial infarction involving other sites: Secondary | ICD-10-CM | POA: Diagnosis present

## 2021-07-30 DIAGNOSIS — I252 Old myocardial infarction: Secondary | ICD-10-CM | POA: Diagnosis not present

## 2021-07-30 DIAGNOSIS — I110 Hypertensive heart disease with heart failure: Secondary | ICD-10-CM | POA: Diagnosis present

## 2021-07-30 DIAGNOSIS — Z888 Allergy status to other drugs, medicaments and biological substances status: Secondary | ICD-10-CM | POA: Diagnosis not present

## 2021-07-30 DIAGNOSIS — I2584 Coronary atherosclerosis due to calcified coronary lesion: Secondary | ICD-10-CM | POA: Diagnosis present

## 2021-07-30 DIAGNOSIS — F32A Depression, unspecified: Secondary | ICD-10-CM | POA: Diagnosis present

## 2021-07-30 DIAGNOSIS — I2119 ST elevation (STEMI) myocardial infarction involving other coronary artery of inferior wall: Secondary | ICD-10-CM | POA: Diagnosis present

## 2021-07-30 DIAGNOSIS — I2510 Atherosclerotic heart disease of native coronary artery without angina pectoris: Secondary | ICD-10-CM | POA: Diagnosis present

## 2021-07-30 DIAGNOSIS — Z882 Allergy status to sulfonamides status: Secondary | ICD-10-CM

## 2021-07-30 DIAGNOSIS — Z7982 Long term (current) use of aspirin: Secondary | ICD-10-CM | POA: Diagnosis not present

## 2021-07-30 HISTORY — PX: CORONARY/GRAFT ACUTE MI REVASCULARIZATION: CATH118305

## 2021-07-30 HISTORY — PX: LEFT HEART CATH AND CORONARY ANGIOGRAPHY: CATH118249

## 2021-07-30 LAB — LIPID PANEL
Cholesterol: 167 mg/dL (ref 0–200)
HDL: 48 mg/dL (ref >40)
LDL Cholesterol: 88 mg/dL (ref 0–99)
Total CHOL/HDL Ratio: 3.5 RATIO
Triglycerides: 154 mg/dL — ABNORMAL HIGH (ref <150)
VLDL: 31 mg/dL (ref 0–40)

## 2021-07-30 LAB — CBC WITH DIFFERENTIAL/PLATELET
Abs Immature Granulocytes: 0.11 10*3/uL — ABNORMAL HIGH (ref 0.00–0.07)
Basophils Absolute: 0.1 10*3/uL (ref 0.0–0.1)
Basophils Relative: 1 %
Eosinophils Absolute: 0.1 10*3/uL (ref 0.0–0.5)
Eosinophils Relative: 0 %
HCT: 46.5 % — ABNORMAL HIGH (ref 36.0–46.0)
Hemoglobin: 16.1 g/dL — ABNORMAL HIGH (ref 12.0–15.0)
Immature Granulocytes: 1 %
Lymphocytes Relative: 11 %
Lymphs Abs: 1.7 10*3/uL (ref 0.7–4.0)
MCH: 32.9 pg (ref 26.0–34.0)
MCHC: 34.6 g/dL (ref 30.0–36.0)
MCV: 95.1 fL (ref 80.0–100.0)
Monocytes Absolute: 0.4 10*3/uL (ref 0.1–1.0)
Monocytes Relative: 3 %
Neutro Abs: 13.1 10*3/uL — ABNORMAL HIGH (ref 1.7–7.7)
Neutrophils Relative %: 84 %
Platelets: 287 10*3/uL (ref 150–400)
RBC: 4.89 MIL/uL (ref 3.87–5.11)
RDW: 12.7 % (ref 11.5–15.5)
WBC: 15.4 10*3/uL — ABNORMAL HIGH (ref 4.0–10.5)
nRBC: 0 % (ref 0.0–0.2)

## 2021-07-30 LAB — COMPREHENSIVE METABOLIC PANEL
ALT: 25 U/L (ref 0–44)
AST: 35 U/L (ref 15–41)
Albumin: 4.2 g/dL (ref 3.5–5.0)
Alkaline Phosphatase: 96 U/L (ref 38–126)
Anion gap: 12 (ref 5–15)
BUN: 8 mg/dL (ref 8–23)
CO2: 25 mmol/L (ref 22–32)
Calcium: 9.2 mg/dL (ref 8.9–10.3)
Chloride: 98 mmol/L (ref 98–111)
Creatinine, Ser: 0.82 mg/dL (ref 0.44–1.00)
GFR, Estimated: >60 mL/min (ref >60)
Glucose, Bld: 238 mg/dL — ABNORMAL HIGH (ref 70–99)
Potassium: 3.9 mmol/L (ref 3.5–5.1)
Sodium: 135 mmol/L (ref 135–145)
Total Bilirubin: 0.9 mg/dL (ref 0.3–1.2)
Total Protein: 7.6 g/dL (ref 6.5–8.1)

## 2021-07-30 LAB — GLUCOSE, CAPILLARY: Glucose-Capillary: 241 mg/dL — ABNORMAL HIGH (ref 70–99)

## 2021-07-30 LAB — RESP PANEL BY RT-PCR (FLU A&B, COVID) ARPGX2
Influenza A by PCR: NEGATIVE
Influenza B by PCR: NEGATIVE
SARS Coronavirus 2 by RT PCR: NEGATIVE

## 2021-07-30 LAB — APTT: aPTT: 27 seconds (ref 24–36)

## 2021-07-30 LAB — TROPONIN I (HIGH SENSITIVITY)
Troponin I (High Sensitivity): 1263 ng/L (ref <18)
Troponin I (High Sensitivity): >24000 ng/L (ref <18)

## 2021-07-30 LAB — PROTIME-INR
INR: 0.9 (ref 0.8–1.2)
Prothrombin Time: 12.6 seconds (ref 11.4–15.2)

## 2021-07-30 SURGERY — CORONARY/GRAFT ACUTE MI REVASCULARIZATION
Anesthesia: Moderate Sedation

## 2021-07-30 MED ORDER — FUROSEMIDE 10 MG/ML IJ SOLN
INTRAMUSCULAR | Status: AC
  Filled 2021-07-30: qty 4

## 2021-07-30 MED ORDER — LOSARTAN POTASSIUM 50 MG PO TABS
25.0000 mg | ORAL_TABLET | Freq: Every day | ORAL | Status: DC
  Administered 2021-07-31 – 2021-08-01 (×2): 25 mg via ORAL
  Filled 2021-07-30 (×2): qty 1

## 2021-07-30 MED ORDER — TICAGRELOR 90 MG PO TABS
90.0000 mg | ORAL_TABLET | Freq: Two times a day (BID) | ORAL | Status: DC
  Administered 2021-07-31 – 2021-08-01 (×3): 90 mg via ORAL
  Filled 2021-07-30 (×3): qty 1

## 2021-07-30 MED ORDER — HEPARIN (PORCINE) IN NACL 1000-0.9 UT/500ML-% IV SOLN
INTRAVENOUS | Status: AC
  Filled 2021-07-30: qty 1000

## 2021-07-30 MED ORDER — MIDAZOLAM HCL 2 MG/2ML IJ SOLN
INTRAMUSCULAR | Status: DC | PRN
  Administered 2021-07-30: 1 mg via INTRAVENOUS

## 2021-07-30 MED ORDER — ACETAMINOPHEN 325 MG PO TABS
650.0000 mg | ORAL_TABLET | ORAL | Status: DC | PRN
  Administered 2021-07-30: 650 mg via ORAL
  Filled 2021-07-30: qty 2

## 2021-07-30 MED ORDER — HEPARIN (PORCINE) IN NACL 1000-0.9 UT/500ML-% IV SOLN
INTRAVENOUS | Status: AC
  Filled 2021-07-30: qty 500

## 2021-07-30 MED ORDER — TICAGRELOR 90 MG PO TABS
180.0000 mg | ORAL_TABLET | Freq: Once | ORAL | Status: AC
  Administered 2021-07-30: 180 mg via ORAL

## 2021-07-30 MED ORDER — VERAPAMIL HCL 2.5 MG/ML IV SOLN
INTRAVENOUS | Status: DC | PRN
  Administered 2021-07-30: 2.5 mg via INTRA_ARTERIAL

## 2021-07-30 MED ORDER — SODIUM CHLORIDE 0.9 % IV SOLN: 250.0000 mL | INTRAVENOUS | Status: DC | PRN

## 2021-07-30 MED ORDER — FENTANYL CITRATE (PF) 100 MCG/2ML IJ SOLN
INTRAMUSCULAR | Status: DC | PRN
  Administered 2021-07-30 (×2): 25 ug via INTRAVENOUS

## 2021-07-30 MED ORDER — LIDOCAINE HCL 1 % IJ SOLN
INTRAMUSCULAR | Status: AC
  Filled 2021-07-30: qty 20

## 2021-07-30 MED ORDER — VERAPAMIL HCL 2.5 MG/ML IV SOLN
INTRAVENOUS | Status: AC
  Filled 2021-07-30: qty 2

## 2021-07-30 MED ORDER — HEPARIN SODIUM (PORCINE) 1000 UNIT/ML IJ SOLN
INTRAMUSCULAR | Status: DC | PRN
  Administered 2021-07-30: 8000 [IU] via INTRAVENOUS

## 2021-07-30 MED ORDER — ISOSORBIDE MONONITRATE ER 30 MG PO TB24
30.0000 mg | ORAL_TABLET | Freq: Every day | ORAL | Status: DC
  Administered 2021-07-31 – 2021-08-01 (×2): 30 mg via ORAL
  Filled 2021-07-30 (×2): qty 1

## 2021-07-30 MED ORDER — ATORVASTATIN CALCIUM 20 MG PO TABS
80.0000 mg | ORAL_TABLET | Freq: Every day | ORAL | Status: DC
  Administered 2021-07-31 – 2021-08-01 (×2): 80 mg via ORAL
  Filled 2021-07-30 (×2): qty 4

## 2021-07-30 MED ORDER — IOHEXOL 350 MG/ML SOLN
INTRAVENOUS | Status: DC | PRN
  Administered 2021-07-30: 106 mL

## 2021-07-30 MED ORDER — HEPARIN (PORCINE) IN NACL 1000-0.9 UT/500ML-% IV SOLN
INTRAVENOUS | Status: DC | PRN
  Administered 2021-07-30: 500 mL
  Administered 2021-07-30: 1000 mL

## 2021-07-30 MED ORDER — FUROSEMIDE 10 MG/ML IJ SOLN
INTRAMUSCULAR | Status: DC | PRN
  Administered 2021-07-30: 40 mg via INTRAVENOUS

## 2021-07-30 MED ORDER — ENOXAPARIN SODIUM 40 MG/0.4ML IJ SOSY
40.0000 mg | PREFILLED_SYRINGE | INTRAMUSCULAR | Status: DC
  Administered 2021-07-31 – 2021-08-01 (×2): 40 mg via SUBCUTANEOUS
  Filled 2021-07-30 (×2): qty 0.4

## 2021-07-30 MED ORDER — FENTANYL CITRATE (PF) 100 MCG/2ML IJ SOLN
INTRAMUSCULAR | Status: AC
  Filled 2021-07-30: qty 2

## 2021-07-30 MED ORDER — FUROSEMIDE 10 MG/ML IJ SOLN: 40.0000 mg | Freq: Two times a day (BID) | INTRAMUSCULAR | Status: DC

## 2021-07-30 MED ORDER — GABAPENTIN 300 MG PO CAPS
300.0000 mg | ORAL_CAPSULE | Freq: Three times a day (TID) | ORAL | Status: DC
  Administered 2021-07-31 – 2021-08-01 (×4): 300 mg via ORAL
  Filled 2021-07-30 (×4): qty 1

## 2021-07-30 MED ORDER — METOPROLOL SUCCINATE ER 50 MG PO TB24: 50.0000 mg | ORAL_TABLET | Freq: Every day | ORAL | Status: DC

## 2021-07-30 MED ORDER — ASPIRIN EC 81 MG PO TBEC
81.0000 mg | DELAYED_RELEASE_TABLET | Freq: Every day | ORAL | Status: DC
  Administered 2021-07-31 – 2021-08-01 (×2): 81 mg via ORAL
  Filled 2021-07-30 (×2): qty 1

## 2021-07-30 MED ORDER — HEPARIN SODIUM (PORCINE) 1000 UNIT/ML IJ SOLN
INTRAMUSCULAR | Status: AC
  Filled 2021-07-30: qty 1

## 2021-07-30 MED ORDER — SODIUM CHLORIDE 0.9% FLUSH
3.0000 mL | Freq: Two times a day (BID) | INTRAVENOUS | Status: DC
  Administered 2021-07-31 (×2): 3 mL via INTRAVENOUS

## 2021-07-30 MED ORDER — SODIUM CHLORIDE 0.9% FLUSH: 3.0000 mL | INTRAVENOUS | Status: DC | PRN

## 2021-07-30 MED ORDER — MIDAZOLAM HCL 2 MG/2ML IJ SOLN
INTRAMUSCULAR | Status: AC
  Filled 2021-07-30: qty 2

## 2021-07-30 MED ORDER — ONDANSETRON HCL 4 MG/2ML IJ SOLN: 4.0000 mg | Freq: Four times a day (QID) | INTRAMUSCULAR | Status: DC | PRN

## 2021-07-30 SURGICAL SUPPLY — 23 items
BALLN TREK RX 2.5X12 (BALLOONS) ×2
BALLN ~~LOC~~ EUPHORA RX 2.75X15 (BALLOONS) ×2
BALLOON TREK RX 2.5X12 (BALLOONS) ×1 IMPLANT
BALLOON ~~LOC~~ EUPHORA RX 2.75X15 (BALLOONS) ×1 IMPLANT
CATH EAGLE EYE PLAT IMAGING (CATHETERS) ×2 IMPLANT
CATH INFINITI JR4 5F (CATHETERS) ×2 IMPLANT
CATH LAUNCHER 6FR EBU 3 (CATHETERS) ×2 IMPLANT
DEVICE RAD TR BAND REGULAR (VASCULAR PRODUCTS) ×2 IMPLANT
DRAPE BRACHIAL (DRAPES) ×2 IMPLANT
GLIDESHEATH SLEND SS 6F .021 (SHEATH) ×2 IMPLANT
GUIDEWIRE INQWIRE 1.5J.035X260 (WIRE) ×1 IMPLANT
INQWIRE 1.5J .035X260CM (WIRE) ×2
KIT ENCORE 26 ADVANTAGE (KITS) ×2 IMPLANT
KIT SYRINGE INJ CVI SPIKEX1 (MISCELLANEOUS) ×2 IMPLANT
PACK CARDIAC CATH (CUSTOM PROCEDURE TRAY) ×2 IMPLANT
PROTECTION STATION PRESSURIZED (MISCELLANEOUS) ×2
SET ATX SIMPLICITY (MISCELLANEOUS) ×2 IMPLANT
STATION PROTECTION PRESSURIZED (MISCELLANEOUS) ×1 IMPLANT
STENT ONYX FRONTIER 2.75X26 (Permanent Stent) ×2 IMPLANT
TUBING CIL FLEX 10 FLL-RA (TUBING) ×2 IMPLANT
VALVE COPILOT STAT (MISCELLANEOUS) ×2 IMPLANT
WIRE ASAHI PROWATER 180CM (WIRE) ×2 IMPLANT
WIRE HITORQ VERSACORE ST 145CM (WIRE) ×2 IMPLANT

## 2021-07-30 NOTE — ED Triage Notes (Signed)
3 Nitro sprays and 1 inch of Nitro paste on chest. Complaints of Chest pain, Jaw pain and Shortness of breath.

## 2021-07-30 NOTE — ED Notes (Signed)
Pt to cath lab with belongings and md/rn

## 2021-07-30 NOTE — H&P (Signed)
[place]   PATIENT NAME: Sylvia Wiggins    MR#:  [id number]  DATE OF BIRTH:  Mackinley Kiehn 18, 1962  DATE OF ADMISSION:  07/30/2021  PRIMARY CARE PHYSICIAN: Patient, [name] (Inactive)   Patient is coming from: Home  REQUESTING/REFERRING PHYSICIAN:Orgel, Ryan, MD CHIEF COMPLAINT:   Chief Complaint  Patient presents with   Code STEMI    HISTORY OF PRESENT ILLNESS:  DONASIA WIMES is a 60 y.o. female with medical history significant for coronary artery disease status post PCI and stents, depression, dyslipidemia and hypertension who presented to the ER with acute code STEMI with acute onset of severe chest pain that started earlier this afternoon with associated worsening dyspnea around 2 PM.  He has been having intermittent dyspnea for the last week.  His pain was radiating to his jaw.  Patient reported chronic back pain without paresthesias or focal muscle weakness.  No nausea or vomiting or abdominal pain.  No bleeding diathesis.  He had a posterior STEMI and was taken to the Cath Lab by Dr. Beatrix Fetters and underwent PCI and stenting of mid LCA with 1 stent.  She was having dyspnea postoperatively and was given 40 mg of IV Lasix.  She admits to recent dyspnea with mild orthopnea.  No significantly worsening lower extremity edema or PND.  ED Course: Initial vital signs when he came revealed a BP 162/111 and later 131/94 with otherwise normal vital signs.  Labs revealed the high-sensitivity troponin I of 1263 hyperglycemia of 238 with otherwise unremarkable CMP.  Is LDL was 88 and HDL 48 with triglycerides of 154.  CBC showed leukocytosis of 15.4 with neutrophilia and hemoconcentration.  Influenza antigens and COVID-19 PCR came back negative.    EKG as reviewed by me : Stable EKG showed sinus rhythm with a rate of 72 with PVCs, and inferior ST segment elevation with anteroseptal and anterolateral ST segment depression in T wave inversion, concerning for inferior and possibly posterior  STEMI.. Imaging: Post cardiac catheterization due to oxygen requirement with his dyspnea portable chest x-ray revealed mild diffuse increased interstitial lung markings likely chronic in nature with mild superimposed component of interstitial edema that cannot be excluded.  The patient was given 40 mg IV Lasix as well as aspirin and Brilinta.  He will be admitted to an ICU bed for further evaluation and management. PAST MEDICAL HISTORY:   Past Medical History:  Diagnosis Date   Abscess, gluteal, left    Recurrent drainage, followed by [facility].   Arthritis    CAD (coronary artery disease)    Stents in Clogged artery at [facility] June 2014   Depression    Elevated lipids    Hyperlipidemia    Hypertension    Myocardial infarction [facility]) June 2014    PAST SURGICAL HISTORY:   Past Surgical History:  Procedure Laterality Date   CARDIAC CATHETERIZATION N/A 08/30/2016   Procedure: Left Heart Cath and Coronary Angiography;  Surgeon: Marcina Millard, MD;  Location: [facility] INVASIVE CV LAB;  Service: Cardiovascular;  Laterality: N/A;   CARDIAC SURGERY  03/30/2013   stent placement   CORONARY ANGIOPLASTY     cardiac stents x 2   CORONARY STENT INTERVENTION N/A 08/23/2020   Procedure: CORONARY STENT INTERVENTION;  Surgeon: Alwyn Pea, MD;  Location: [facility] INVASIVE CV LAB;  Service: Cardiovascular;  Laterality: N/A;   INGUINAL HERNIA REPAIR Right 02/26/2020   Procedure: HERNIA REPAIR INGUINAL ADULT;  Surgeon: Sung Amabile, DO;  Location: [facility] ORS;  Service:  General;  Laterality: Right;   LEFT HEART CATH AND CORONARY ANGIOGRAPHY N/A 08/23/2020   Procedure: LEFT HEART CATH AND CORONARY ANGIOGRAPHY and possible PCI and stent;  Surgeon: Alwyn Pea, MD;  Location: [facility] INVASIVE CV LAB;  Service: Cardiovascular;  Laterality: N/A;   LEFT HEART CATH AND CORS/GRAFTS ANGIOGRAPHY N/A 02/18/2018   Procedure: LEFT HEART CATH AND CORS/GRAFTS ANGIOGRAPHY;  Surgeon: Dalia Heading, MD;  Location: [facility]  INVASIVE CV LAB;  Service: Cardiovascular;  Laterality: N/A;   RECTAL SURGERY  Anal Fissure   XI ROBOTIC ASSISTED INGUINAL HERNIA REPAIR WITH MESH Right 05/16/2019   Procedure: XI ROBOTIC ASSISTED Right INGUINAL HERNIA REPAIR WITH MESH;  Surgeon: Sung Amabile, DO;  Location: [facility] ORS;  Service: General;  Laterality: Right;    SOCIAL HISTORY:   Social History   Tobacco Use   Smoking status: Every Day    Packs/day: 0.25    Types: Cigarettes   Smokeless tobacco: Never  Substance Use Topics   Alcohol use: No    Alcohol/week: 0.0 standard drinks    FAMILY HISTORY:   Family History  Problem Relation Age of Onset   Cancer Mother        colon   Heart disease Father        Died of Heart Attack    DRUG ALLERGIES:   Allergies  Allergen Reactions   Other Other (See Comments)    Pt states the gas used to put her to sleep made her have a bad headache, "out of it for 3 days", made patient easy to anger   Sulfamethoxazole-Trimethoprim Nausea Only   Penicillins Nausea Only    Did it involve swelling of the face/tongue/throat, SOB, or low BP? No Did it involve sudden or severe rash/hives, skin peeling, or any reaction on the inside of your mouth or nose? No Did you need to seek medical attention at a hospital or doctor's office? No When did it last happen?      1990 If all above answers are "NO", may proceed with cephalosporin use.     REVIEW OF SYSTEMS:   ROS As per history of present illness. All pertinent systems were reviewed above. Constitutional, HEENT, cardiovascular, respiratory, GI, GU, musculoskeletal, neuro, psychiatric, endocrine, integumentary and hematologic systems were reviewed and are otherwise negative/unremarkable except for positive findings mentioned above in the HPI.   MEDICATIONS AT HOME:   Prior to Admission medications   Medication Sig Start Date End Date Taking? Authorizing Provider  aspirin EC 81 MG EC tablet Take 1 tablet (81 mg total) by mouth daily.  Swallow whole. 08/25/20   Briant Cedar, MD  atorvastatin (LIPITOR) 80 MG tablet Take 1 tablet (80 mg total) by mouth daily. 08/25/20 09/24/20  Briant Cedar, MD  clopidogrel (PLAVIX) 75 MG tablet Take 75 mg by mouth daily. 09/23/20   [provider]  gabapentin (NEURONTIN) 300 MG capsule Take 300 mg by mouth 3 (three) times daily. 09/23/20   [provider]  isosorbide mononitrate (IMDUR) 30 MG 24 hr tablet Take 1 tablet (30 mg total) by mouth daily. 08/25/20 09/24/20  Briant Cedar, MD  losartan (COZAAR) 25 MG tablet Take 1 tablet (25 mg total) by mouth daily. 08/25/20 09/24/20  Briant Cedar, MD  metoprolol succinate (TOPROL-XL) 50 MG 24 hr tablet Take 1 tablet (50 mg total) by mouth daily. Take with or immediately following a meal. 08/25/20 09/24/20  Briant Cedar, MD  nitroGLYCERIN (NITROSTAT) 0.4 MG SL tablet Place 1  tablet (0.4 mg total) under the tongue every 5 (five) minutes x 3 doses as needed for chest pain. 08/24/20 09/23/20  Briant Cedar, MD      VITAL SIGNS:  Blood pressure (!) 134/91, pulse 76, resp. rate 11, weight 79.8 kg, SpO2 100 %.  PHYSICAL EXAMINATION:  Physical Exam  GENERAL:  60 y.o.-year-old patient lying in the bed with no acute distress.  EYES: Pupils equal, round, reactive to light and accommodation. No scleral icterus. Extraocular muscles intact.  HEENT: Head atraumatic, normocephalic. Oropharynx and nasopharynx clear.  NECK:  Supple, no jugular venous distention. No thyroid enlargement, no tenderness.  LUNGS: Mild diminished bibasilar breath sounds with bibasal rales.. No use of accessory muscles of respiration.  CARDIOVASCULAR: Regular rate and rhythm, S1, S2 normal. No murmurs, rubs, or gallops.  ABDOMEN: Soft, nondistended, nontender. Bowel sounds present. No organomegaly or mass.  EXTREMITIES: No pedal edema, cyanosis, or clubbing.  NEUROLOGIC: Cranial nerves II through XII are intact. Muscle  strength 5/5 in all extremities. Sensation intact. Gait not checked.  PSYCHIATRIC: The patient is alert and oriented x 3.  Normal affect and good eye contact. SKIN: No obvious rash, lesion, or ulcer.   LABORATORY PANEL:   CBC Recent Labs  Lab 07/30/21 1922  WBC 15.4*  HGB 16.1*  HCT 46.5*  PLT 287   ------------------------------------------------------------------------------------------------------------------  Chemistries  Recent Labs  Lab 07/30/21 1922  NA 135  K 3.9  CL 98  CO2 25  GLUCOSE 238*  BUN 8  CREATININE 0.82  CALCIUM 9.2  AST 35  ALT 25  ALKPHOS 96  BILITOT 0.9   ------------------------------------------------------------------------------------------------------------------  Cardiac Enzymes No results for input(s): TROPONINI in the last 168 hours. ------------------------------------------------------------------------------------------------------------------  RADIOLOGY:  CARDIAC CATHETERIZATION  Result Date: 07/30/2021   Mid LAD to Dist LAD lesion is 75% stenosed.   Ost LAD lesion is 50% stenosed.   Dist RCA lesion is 50% stenosed.   Ost 3rd Mrg to 3rd Mrg lesion is 90% stenosed.   RPAV lesion is 100% stenosed.   RPDA lesion is 95% stenosed.   Ost Cx to Prox Cx lesion is 100% stenosed.   Non-stenotic Prox LAD lesion was previously treated.   Non-stenotic Prox Cx to Dist Cx lesion was previously treated.   Non-stenotic Prox RCA to Mid RCA lesion was previously treated.   A drug-eluting stent was successfully placed using a STENT ONYX FRONTIER [id number].   Post intervention, there is a 0% residual stenosis.   LV end diastolic pressure is severely elevated.   There is no aortic valve stenosis. Conclusion: Culprit for posterior STEMI is 100% occlusion in the mid circumflex. Severe coronary disease elsewhere with patent stent in the LAD, and patent stent in the proximal/mid RCA. Severely elevated LVEDP estimated at 43 mmHg. Successful IVUS guided PCI to the mid  left circumflex with placement of a 2.75 x 26 mm Onyx DES with excellent angiographic result and TIMI-3 flow. Recommendation: Dual antiplatelet therapy with aspirin and ticagrelor for 12 months, ideally longer Chest x-ray upon arrival to the ICU.  Likely will require additional diuresis given her severely elevated EDP. Echocardiogram in the morning. Ongoing aggressive secondary prevention Follow-up with primary cardiologist 1 week following discharge.   DG Chest Port 1 View  Result Date: 07/30/2021 CLINICAL DATA:  Status post catheterization lab. EXAM: PORTABLE CHEST 1 VIEW COMPARISON:  September 29, 2020 FINDINGS: The lungs are hyperinflated. Overlying radiopaque cardiac lead wires and radiopaque tags are seen. Mild, diffusely increased interstitial lung markings are  noted. This is slightly more prominent on the current study when compared to the prior exam. There is no evidence of focal consolidation, pleural effusion or pneumothorax. The heart size and mediastinal contours are within normal limits. A coronary artery stent is in place. The visualized skeletal structures are unremarkable. IMPRESSION: Mild, diffusely increased interstitial lung markings, which are likely, in part, chronic in nature. A mild, superimposed component of interstitial edema cannot be excluded. Electronically Signed   By: Aram Candela M.D.   On: 07/30/2021 21:11      IMPRESSION AND PLAN:  Active Problems:   STEMI (ST elevation myocardial infarction) (HCC)  1.  Acute posterior STEMI status post PCI and mid LCA stent with history of coronary artery disease. - The patient is admitted to an ICU bed. - He is continued on aspirin and his Plavix was switched to Brilinta. - High-dose statin is being continued. - Imdur discontinued as well as beta-blocker therapy with Toprol-XL. - We will add as needed sublingual nitroglycerin.  2.  Acute on chronic diastolic CHF with subsequent acute hypoxic respiratory failure. - Patient  is a history of grade 1 diastolic function with EF of 55 to 60% on a 2D echo less than a year ago, with trivial mitral regurgitation and mildly enlarged right ventricle. - He will be diuresed with IV Lasix. - We will continue Toprol-XL and Cozaar. - Strict I's and O's. - Fluid restriction. - O2 protocol will be followed.  3.  Essential hypertension. - We will continue Cozaar and Toprol-XL.  4.  Dyslipidemia. - We will continue statin therapy.  DVT prophylaxis: Lovenox. Code Status: full code. Family Communication:  The plan of care was discussed in details with the patient (and family). I answered all questions. The patient agreed to proceed with the above mentioned plan. Further management will depend upon hospital course. Disposition Plan: Back to previous home environment Consults called: Cardiology. All the records are reviewed and case discussed with ED provider.  Status is: Inpatient  Remains inpatient appropriate because:Ongoing diagnostic testing needed not appropriate for outpatient work up, Unsafe d/c plan, IV treatments appropriate due to intensity of illness or inability to take PO, and Inpatient level of care appropriate due to severity of illness   Dispo: The patient is from: Home              Anticipated d/c is to: Home              Patient currently is not medically stable to d/c.              Difficult to place patient: No  TOTAL TIME TAKING CARE OF THIS PATIENT: 55 minutes.     Hannah Beat M.D on 07/30/2021 at 9:39 PM  [facility]   From 7 PM-7 AM, contact night-coverage [email or web address]  CC: Primary care physician; Patient, [name] (Inactive)

## 2021-07-30 NOTE — Consult Note (Signed)
[facility] Cardiology  CARDIOLOGY CONSULT NOTE  Patient ID: Sylvia Wiggins MRN: [id number] DOB/AGE: 61-13-1962 61 y.o.  Admit date: 07/30/2021 Referring Physician ED Primary Physician Patient, [name] (Inactive) Primary Cardiologist [facility] Reason for Consultation STEMI  HPI:  Sylvia Wiggins is a 60 year old female with a history of CAD (multivessel CAD with stents in the LAD, and RCA) tension, hyperlipidemia who presents with chest pain and shortness of breath.  EKG shows inferior ST elevation and reciprocal changes in V2 through V4.  She has ongoing chest pain at this time.    Review of systems complete and found to be negative unless listed above    Past Medical History:  Diagnosis Date   Abscess, gluteal, left    Recurrent drainage, followed by [facility].   Arthritis    CAD (coronary artery disease)    Stents in Clogged artery at [facility] June 2014   Depression    Elevated lipids    Hyperlipidemia    Hypertension    Myocardial infarction [facility]) June 2014    Past Surgical History:  Procedure Laterality Date   CARDIAC CATHETERIZATION N/A 08/30/2016   Procedure: Left Heart Cath and Coronary Angiography;  Surgeon: Marcina Millard, MD;  Location: [facility] INVASIVE CV LAB;  Service: Cardiovascular;  Laterality: N/A;   CARDIAC SURGERY  03/30/2013   stent placement   CORONARY ANGIOPLASTY     cardiac stents x 2   CORONARY STENT INTERVENTION N/A 08/23/2020   Procedure: CORONARY STENT INTERVENTION;  Surgeon: Alwyn Pea, MD;  Location: [facility] INVASIVE CV LAB;  Service: Cardiovascular;  Laterality: N/A;   INGUINAL HERNIA REPAIR Right 02/26/2020   Procedure: HERNIA REPAIR INGUINAL ADULT;  Surgeon: Sung Amabile, DO;  Location: [facility] ORS;  Service: General;  Laterality: Right;   LEFT HEART CATH AND CORONARY ANGIOGRAPHY N/A 08/23/2020   Procedure: LEFT HEART CATH AND CORONARY ANGIOGRAPHY and possible PCI and stent;  Surgeon: Alwyn Pea, MD;  Location: [facility] INVASIVE CV LAB;  Service:  Cardiovascular;  Laterality: N/A;   LEFT HEART CATH AND CORS/GRAFTS ANGIOGRAPHY N/A 02/18/2018   Procedure: LEFT HEART CATH AND CORS/GRAFTS ANGIOGRAPHY;  Surgeon: Dalia Heading, MD;  Location: [facility] INVASIVE CV LAB;  Service: Cardiovascular;  Laterality: N/A;   RECTAL SURGERY  Anal Fissure   XI ROBOTIC ASSISTED INGUINAL HERNIA REPAIR WITH MESH Right 05/16/2019   Procedure: XI ROBOTIC ASSISTED Right INGUINAL HERNIA REPAIR WITH MESH;  Surgeon: Sung Amabile, DO;  Location: [facility] ORS;  Service: General;  Laterality: Right;    (Not in a hospital admission)  Social History   Socioeconomic History   Marital status: Legally Separated    Spouse name: Not on file   Number of children: Not on file   Years of education: Not on file   Highest education level: Not on file  Occupational History   Not on file  Tobacco Use   Smoking status: Every Day    Packs/day: 0.25    Types: Cigarettes   Smokeless tobacco: Never  Vaping Use   Vaping Use: Never used  Substance and Sexual Activity   Alcohol use: No    Alcohol/week: 0.0 standard drinks   Drug use: No   Sexual activity: Never  Other Topics Concern   Not on file  Social History Narrative   Not on file   Social Determinants of Health   Financial Resource Strain: Not on file  Food Insecurity: Not on file  Transportation Needs: Not on file  Physical Activity: Not on file  Stress: Not on file  Social Connections: Not on file  Intimate Partner Violence: Not on file    Family History  Problem Relation Age of Onset   Cancer Mother        colon   Heart disease Father        Died of Heart Attack      Review of systems complete and found to be negative unless listed above      PHYSICAL EXAM  General: Appears uncomfortable.  HEENT:  Normocephalic and atramatic Neck:  No JVD.  Lungs: Clear bilaterally to auscultation and percussion. Heart: HRRR . Distant heart sounds.  Abdomen: Bowel sounds are positive, abdomen soft and non-tender   Msk:  Back normal, normal gait. Normal strength and tone for age. Extremities: No clubbing, cyanosis or edema.   Neuro: Alert and oriented X 3. Psych:  Good affect, responds appropriately  Labs:   Lab Results  Component Value Date   WBC 6.4 10/01/2020   HGB 15.0 10/01/2020   HCT 43.4 10/01/2020   MCV 98.2 10/01/2020   PLT 179 10/01/2020   No results for input(s): NA, K, CL, CO2, BUN, CREATININE, CALCIUM, PROT, BILITOT, ALKPHOS, ALT, AST, GLUCOSE in the last 168 hours.  Invalid input(s): LABALBU Lab Results  Component Value Date   CKTOTAL 102 03/29/2013   CKMB 2.9 03/29/2013   TROPONINI 0.83 (HH) 02/17/2018    Lab Results  Component Value Date   CHOL 144 09/30/2020   CHOL 247 (H) 08/22/2020   CHOL 190 08/30/2016   Lab Results  Component Value Date   HDL 42 09/30/2020   HDL 43 08/22/2020   HDL 33 (L) 08/30/2016   Lab Results  Component Value Date   LDLCALC 57 09/30/2020   LDLCALC 153 (H) 08/22/2020   LDLCALC UNABLE TO CALCULATE IF TRIGLYCERIDE OVER 400 mg/dL 61/60/7371   Lab Results  Component Value Date   TRIG 226 (H) 09/30/2020   TRIG 256 (H) 08/22/2020   TRIG 928 (H) 08/30/2016   Lab Results  Component Value Date   CHOLHDL 3.4 09/30/2020   CHOLHDL 5.7 08/22/2020   CHOLHDL 5.8 08/30/2016   No results found for: LDLDIRECT    Radiology: No results found.  EKG: Sinus rhythm. Inferior ST elevation. Significant ST depression V2-V4.   ASSESSMENT AND PLAN:  # Posterior STEMI Known h/o CAD here with chest pain. EKG with ST depressions anteriorly and borderline ST elevation inferior. Recommend emergent LHC for further management. This was discussed with the patient who was in agreement to proceed. Risks and benefits discussed.   Signed: Armando Reichert MD 07/30/2021, 7:26 PM

## 2021-07-30 NOTE — Progress Notes (Signed)
Chaplain responded to Code Stemi. Patient being cared for by staff. Silent prayer for patient. Inquired about family, none present.

## 2021-07-30 NOTE — ED Provider Notes (Signed)
[facility] Emergency Department Provider Note  ____________________________________________   Event Date/Time   First MD Initiated Contact with Patient 07/30/21 1919     (approximate)  I have reviewed the triage vital signs and the nursing notes.   HISTORY  Chief Complaint Code STEMI    HPI Sylvia Wiggins is a 60 y.o. female who comes in with concerns for code STEMI.  Patient reports off-and-on shortness of breath for the past week but then developed severe chest pain that started today this afternoon, radiating up into her jaw.  She reports some chronic back pain but denies it being new.  Denies any numbness or tingling in her arms or legs.  The pain is severe, constant, patient was given nitro with EMS.  Nothing makes it worse.          Past Medical History:  Diagnosis Date   Abscess, gluteal, left    Recurrent drainage, followed by [facility]   Arthritis    CAD (coronary artery disease)    Stents in Clogged artery at [facility] June 2014   Depression    Elevated lipids    Hyperlipidemia    Hypertension    Myocardial infarction [facility]) June 2014    Patient Active Problem List   Diagnosis Date Noted   Unstable angina (HCC) 08/21/2020   Nicotine dependence 08/21/2020   HTN (hypertension) 02/16/2018   Abnormal EKG 02/16/2018   Chest pain 02/16/2018   Chest pain, rule out acute myocardial infarction 08/30/2016   NSTEMI (non-ST elevated myocardial infarction) (HCC) 08/30/2016   CAD (coronary artery disease) 01/26/2016   Hypertension 01/26/2016   Depression 01/26/2016   Hyperlipidemia 01/26/2016   Tremor of both hands 01/26/2016   Chronic right-sided low back pain with right-sided sciatica 01/26/2016   Major depression 10/05/2015    Past Surgical History:  Procedure Laterality Date   CARDIAC CATHETERIZATION N/A 08/30/2016   Procedure: Left Heart Cath and Coronary Angiography;  Surgeon: Marcina Millard, MD;  Location: [facility] INVASIVE CV LAB;   Service: Cardiovascular;  Laterality: N/A;   CARDIAC SURGERY  03/30/2013   stent placement   CORONARY ANGIOPLASTY     cardiac stents x 2   CORONARY STENT INTERVENTION N/A 08/23/2020   Procedure: CORONARY STENT INTERVENTION;  Surgeon: Alwyn Pea, MD;  Location: [facility] INVASIVE CV LAB;  Service: Cardiovascular;  Laterality: N/A;   INGUINAL HERNIA REPAIR Right 02/26/2020   Procedure: HERNIA REPAIR INGUINAL ADULT;  Surgeon: Sung Amabile, DO;  Location: [facility] ORS;  Service: General;  Laterality: Right;   LEFT HEART CATH AND CORONARY ANGIOGRAPHY N/A 08/23/2020   Procedure: LEFT HEART CATH AND CORONARY ANGIOGRAPHY and possible PCI and stent;  Surgeon: Alwyn Pea, MD;  Location: [facility] INVASIVE CV LAB;  Service: Cardiovascular;  Laterality: N/A;   LEFT HEART CATH AND CORS/GRAFTS ANGIOGRAPHY N/A 02/18/2018   Procedure: LEFT HEART CATH AND CORS/GRAFTS ANGIOGRAPHY;  Surgeon: Dalia Heading, MD;  Location: [facility] INVASIVE CV LAB;  Service: Cardiovascular;  Laterality: N/A;   RECTAL SURGERY  Anal Fissure   XI ROBOTIC ASSISTED INGUINAL HERNIA REPAIR WITH MESH Right 05/16/2019   Procedure: XI ROBOTIC ASSISTED Right INGUINAL HERNIA REPAIR WITH MESH;  Surgeon: Sung Amabile, DO;  Location: [facility] ORS;  Service: General;  Laterality: Right;    Prior to Admission medications   Medication Sig Start Date End Date Taking? Authorizing Provider  aspirin EC 81 MG EC tablet Take 1 tablet (81 mg total) by mouth daily. Swallow whole. 08/25/20   Andreas Newport  J, MD  atorvastatin (LIPITOR) 80 MG tablet Take 1 tablet (80 mg total) by mouth daily. 08/25/20 09/24/20  Briant Cedar, MD  clopidogrel (PLAVIX) 75 MG tablet Take 75 mg by mouth daily. 09/23/20   [provider]  gabapentin (NEURONTIN) 300 MG capsule Take 300 mg by mouth 3 (three) times daily. 09/23/20   [provider]  isosorbide mononitrate (IMDUR) 30 MG 24 hr tablet Take 1 tablet (30 mg total) by mouth daily. 08/25/20 09/24/20   Briant Cedar, MD  losartan (COZAAR) 25 MG tablet Take 1 tablet (25 mg total) by mouth daily. 08/25/20 09/24/20  Briant Cedar, MD  metoprolol succinate (TOPROL-XL) 50 MG 24 hr tablet Take 1 tablet (50 mg total) by mouth daily. Take with or immediately following a meal. 08/25/20 09/24/20  Briant Cedar, MD  nitroGLYCERIN (NITROSTAT) 0.4 MG SL tablet Place 1 tablet (0.4 mg total) under the tongue every 5 (five) minutes x 3 doses as needed for chest pain. 08/24/20 09/23/20  Briant Cedar, MD    Allergies Other, Sulfamethoxazole-trimethoprim, and Penicillins  Family History  Problem Relation Age of Onset   Cancer Mother        colon   Heart disease Father        Died of Heart Attack    Social History Social History   Tobacco Use   Smoking status: Every Day    Packs/day: 0.25    Types: Cigarettes   Smokeless tobacco: Never  Vaping Use   Vaping Use: Never used  Substance Use Topics   Alcohol use: No    Alcohol/week: 0.0 standard drinks   Drug use: No      Review of Systems Constitutional: No fever/chills Eyes: No visual changes. ENT: No sore throat. Cardiovascular: Positive chest pain Respiratory: Denies shortness of breath. Gastrointestinal: No abdominal pain.  No nausea, no vomiting.  No diarrhea.  No constipation. Genitourinary: Negative for dysuria. Musculoskeletal: Negative for back pain. Skin: Negative for rash. Neurological: Negative for headaches, focal weakness or numbness. All other ROS negative ____________________________________________   PHYSICAL EXAM:  VITAL SIGNS: ED Triage Vitals  Enc Vitals Group     BP 07/30/21 1917 (!) 162/111     Pulse Rate 07/30/21 1916 81     Resp 07/30/21 1916 20     Temp --      Temp Source 07/30/21 1916 Oral     SpO2 07/30/21 1916 96 %     Weight 07/30/21 1916 176 lb (79.8 kg)     Height --      Head Circumference --      Peak Flow --      Pain Score 07/30/21 1916 8     Pain Loc --       Pain Edu? --      Excl. in GC? --     Constitutional: Alert and oriented. Well appearing and in no acute distress. Eyes: Conjunctivae are normal. EOMI. Head: Atraumatic. Nose: No congestion/rhinnorhea. Mouth/Throat: Mucous membranes are moist.   Neck: No stridor. Trachea Midline. FROM Cardiovascular: Normal rate, regular rhythm. Grossly normal heart sounds.  Good peripheral circulation. Respiratory: Normal respiratory effort.  No retractions. Lungs CTAB. Gastrointestinal: Soft and nontender. No distention. No abdominal bruits.  Musculoskeletal: No lower extremity tenderness nor edema.  No joint effusions. Neurologic:  Normal speech and language. No gross focal neurologic deficits are appreciated.  Skin:  Skin is warm, dry and intact. No rash noted. Psychiatric: Mood and affect are normal. Speech  and behavior are normal. GU: Deferred   ____________________________________________   LABS (all labs ordered are listed, but only abnormal results are displayed)  Labs Reviewed  RESP PANEL BY RT-PCR (FLU A&B, COVID) ARPGX2  CBC WITH DIFFERENTIAL/PLATELET  COMPREHENSIVE METABOLIC PANEL  PROTIME-INR  HEMOGLOBIN A1C  APTT  LIPID PANEL  TROPONIN I (HIGH SENSITIVITY)   ____________________________________________   ED ECG REPORT I, Concha Se, the attending physician, personally viewed and interpreted this ECG.  Patient has ST depression and V2 V3, some artifact, T wave inversions, normal intervals ____________________________________________   PROCEDURES  Procedure(s) performed (including Critical Care):  .1-3 Lead EKG Interpretation Performed by: Concha Se, MD Authorized by: Concha Se, MD     Interpretation: normal     ECG rate:  80s   ECG rate assessment: normal     Rhythm: sinus rhythm     Ectopy: none     Conduction: normal   Ultrasound ED Peripheral IV (Provider)  Date/Time: 07/30/2021 7:22 PM Performed by: Concha Se, MD Authorized by: Concha Se, MD   Procedure details:    Indications: hydration     Skin Prep: chlorhexidine gluconate     Location:  Left AC   Angiocath:  20 G   Bedside Ultrasound Guided: No     Images: archived     Patient tolerated procedure without complications: Yes     Dressing applied: Yes   .Critical Care Performed by: Concha Se, MD Authorized by: Concha Se, MD   Critical care provider statement:    Critical care time (minutes):  20   Critical care was necessary to treat or prevent imminent or life-threatening deterioration of the following conditions:  Cardiac failure   Critical care was time spent personally by me on the following activities:  Blood draw for specimens, development of treatment plan with patient or surrogate, discussions with consultants, discussions with primary provider, evaluation of patient's response to treatment, examination of patient, interpretation of cardiac output measurements, obtaining history from patient or surrogate, review of old charts, re-evaluation of patient's condition, ordering and review of radiographic studies, ordering and review of laboratory studies and ordering and performing treatments and interventions   I assumed direction of critical care for this patient from another provider in my specialty: no     Care discussed with: admitting provider     ____________________________________________   INITIAL IMPRESSION / ASSESSMENT AND PLAN / ED COURSE   RAEVIN WIERENGA was evaluated in Emergency Department on 07/30/2021 for the symptoms described in the history of present illness. She was evaluated in the context of the global COVID-19 pandemic, which necessitated consideration that the patient might be at risk for infection with the SARS-CoV-2 virus that causes COVID-19. Institutional protocols and algorithms that pertain to the evaluation of patients at risk for COVID-19 are in a state of rapid change based on information released by regulatory bodies  including the CDC and federal and state organizations. These policies and algorithms were followed during the patient's care in the ED.    Most Likely DDx:  -STEMI called from the field.  cardiology at bedside and patient was taken emergently to the Cath Lab  DDx that was also considered d/t potential to cause harm, but was found less likely based on history and physical (as detailed above): -PNA (no fevers, cough but CXR to evaluate) -PNX (reassured with equal b/l breath sounds, CXR to evaluate) -Symptomatic anemia (will get H&H) -Pulmonary embolism as no  sob at rest, not pleuritic in nature, no hypoxia -Aortic Dissection as no tearing pain and no radiation to the mid back, pulses equal, no numbness or tingling -Pericarditis no rub on exam, EKG changes or hx to suggest dx -Tamponade (no notable SOB, tachycardic, hypotensive) -Esophageal rupture (no h/o diffuse vomitting/no crepitus)       ____________________________________________   FINAL CLINICAL IMPRESSION(S) / ED DIAGNOSES   Final diagnoses:  ST elevation myocardial infarction (STEMI), unspecified artery (HCC)     MEDICATIONS GIVEN DURING THIS VISIT:  Medications  verapamil (ISOPTIN) injection (2.5 mg Intra-arterial Given 07/30/21 1938)  Heparin (Porcine) in NaCl 1000-0.9 UT/500ML-% SOLN (500 mLs  Given 07/30/21 1938)  fentaNYL (SUBLIMAZE) injection (25 mcg Intravenous Given 07/30/21 1939)  midazolam (VERSED) injection (1 mg Intravenous Given 07/30/21 1940)  heparin sodium (porcine) injection (8,000 Units Intravenous Given 07/30/21 1941)  ticagrelor (BRILINTA) tablet 180 mg (180 mg Oral Given 07/30/21 1912)     ED Discharge Orders     None        Note:  This document was prepared using Dragon voice recognition software and may include unintentional dictation errors.    Concha Se, MD 07/30/21 [phone]

## 2021-07-31 ENCOUNTER — Inpatient Hospital Stay
Admit: 2021-07-31 | Discharge: 2021-07-31 | Disposition: A | Discharge status: Home / Self Care | Payer: Medicare HMO | Attending: Cardiology | Admitting: Cardiology

## 2021-07-31 DIAGNOSIS — I213 ST elevation (STEMI) myocardial infarction of unspecified site: Secondary | ICD-10-CM | POA: Diagnosis not present

## 2021-07-31 LAB — CBC
HCT: 47.7 % — ABNORMAL HIGH (ref 36.0–46.0)
Hemoglobin: 16.5 g/dL — ABNORMAL HIGH (ref 12.0–15.0)
MCH: 32.2 pg (ref 26.0–34.0)
MCHC: 34.6 g/dL (ref 30.0–36.0)
MCV: 93 fL (ref 80.0–100.0)
Platelets: 253 10*3/uL (ref 150–400)
RBC: 5.13 MIL/uL — ABNORMAL HIGH (ref 3.87–5.11)
RDW: 12.8 % (ref 11.5–15.5)
WBC: 12 10*3/uL — ABNORMAL HIGH (ref 4.0–10.5)
nRBC: 0 % (ref 0.0–0.2)

## 2021-07-31 LAB — ECHOCARDIOGRAM COMPLETE
AR max vel: 1.82 cm2
AV Area VTI: 2.55 cm2
AV Area mean vel: 1.72 cm2
AV Mean grad: 2 mmHg
AV Peak grad: 2.9 mmHg
Ao pk vel: 0.85 m/s
Area-P 1/2: 4.33 cm2
MV VTI: 2.07 cm2
S' Lateral: 2.7 cm
Weight: 2331.58 oz

## 2021-07-31 LAB — BASIC METABOLIC PANEL
Anion gap: 11 (ref 5–15)
BUN: 10 mg/dL (ref 8–23)
CO2: 27 mmol/L (ref 22–32)
Calcium: 9.3 mg/dL (ref 8.9–10.3)
Chloride: 103 mmol/L (ref 98–111)
Creatinine, Ser: 0.68 mg/dL (ref 0.44–1.00)
GFR, Estimated: >60 mL/min (ref >60)
Glucose, Bld: 169 mg/dL — ABNORMAL HIGH (ref 70–99)
Potassium: 4.1 mmol/L (ref 3.5–5.1)
Sodium: 141 mmol/L (ref 135–145)

## 2021-07-31 LAB — POCT ACTIVATED CLOTTING TIME
Activated Clotting Time: 294 seconds
Activated Clotting Time: 387 seconds

## 2021-07-31 MED ORDER — METOPROLOL SUCCINATE ER 50 MG PO TB24
25.0000 mg | ORAL_TABLET | Freq: Every day | ORAL | Status: DC
  Administered 2021-07-31 – 2021-08-01 (×2): 25 mg via ORAL
  Filled 2021-07-31 (×2): qty 1

## 2021-07-31 MED ORDER — CHLORHEXIDINE GLUCONATE CLOTH 2 % EX PADS: 6.0000 | MEDICATED_PAD | Freq: Every day | CUTANEOUS | Status: DC

## 2021-07-31 NOTE — Consult Note (Signed)
[facility] Cardiology  CARDIOLOGY CONSULT NOTE  Patient ID: Sylvia Wiggins MRN: [id number] DOB/AGE: September 28, 1961 60 y.o.  Admit date: 07/30/2021 Referring Physician ED Primary Physician Patient, [name] (Inactive) Primary Cardiologist [facility] Reason for Consultation STEMI  HPI:  Sylvia Wiggins is a 60 year old female with a history of CAD (multivessel CAD with stents in the LAD, LCx and RCA), hypertension, hyperlipidemia, ongoing tobacco use who presents with chest pain and shortness of breath and was discovered to have a posterior STEMI. S/p catheterization with placement of 1 DES to mLCx.   Interval History: - NO acute events overnight. - Denies chest pain. Breathing feels quite good.    Review of systems complete and found to be negative unless listed above    Past Medical History:  Diagnosis Date   Abscess, gluteal, left    Recurrent drainage, followed by [facility].   Arthritis    CAD (coronary artery disease)    Stents in Clogged artery at [facility] June 2014   Depression    Elevated lipids    Hyperlipidemia    Hypertension    Myocardial infarction [facility]) June 2014    Past Surgical History:  Procedure Laterality Date   CARDIAC CATHETERIZATION N/A 08/30/2016   Procedure: Left Heart Cath and Coronary Angiography;  Surgeon: Marcina Millard, MD;  Location: [facility] INVASIVE CV LAB;  Service: Cardiovascular;  Laterality: N/A;   CARDIAC SURGERY  03/30/2013   stent placement   CORONARY ANGIOPLASTY     cardiac stents x 2   CORONARY STENT INTERVENTION N/A 08/23/2020   Procedure: CORONARY STENT INTERVENTION;  Surgeon: Alwyn Pea, MD;  Location: [facility] INVASIVE CV LAB;  Service: Cardiovascular;  Laterality: N/A;   INGUINAL HERNIA REPAIR Right 02/26/2020   Procedure: HERNIA REPAIR INGUINAL ADULT;  Surgeon: Sung Amabile, DO;  Location: [facility] ORS;  Service: General;  Laterality: Right;   LEFT HEART CATH AND CORONARY ANGIOGRAPHY N/A 08/23/2020   Procedure: LEFT HEART CATH AND CORONARY  ANGIOGRAPHY and possible PCI and stent;  Surgeon: Alwyn Pea, MD;  Location: [facility] INVASIVE CV LAB;  Service: Cardiovascular;  Laterality: N/A;   LEFT HEART CATH AND CORS/GRAFTS ANGIOGRAPHY N/A 02/18/2018   Procedure: LEFT HEART CATH AND CORS/GRAFTS ANGIOGRAPHY;  Surgeon: Dalia Heading, MD;  Location: [facility] INVASIVE CV LAB;  Service: Cardiovascular;  Laterality: N/A;   RECTAL SURGERY  Anal Fissure   XI ROBOTIC ASSISTED INGUINAL HERNIA REPAIR WITH MESH Right 05/16/2019   Procedure: XI ROBOTIC ASSISTED Right INGUINAL HERNIA REPAIR WITH MESH;  Surgeon: Sung Amabile, DO;  Location: [facility] ORS;  Service: General;  Laterality: Right;    (Not in a hospital admission)  Social History   Socioeconomic History   Marital status: Legally Separated    Spouse name: Not on file   Number of children: Not on file   Years of education: Not on file   Highest education level: Not on file  Occupational History   Not on file  Tobacco Use   Smoking status: Every Day    Packs/day: 0.25    Types: Cigarettes   Smokeless tobacco: Never  Vaping Use   Vaping Use: Never used  Substance and Sexual Activity   Alcohol use: No    Alcohol/week: 0.0 standard drinks   Drug use: No   Sexual activity: Never  Other Topics Concern   Not on file  Social History Narrative   Not on file   Social Determinants of Health   Financial Resource Strain: Not on file  Food Insecurity:  Not on file  Transportation Needs: Not on file  Physical Activity: Not on file  Stress: Not on file  Social Connections: Not on file  Intimate Partner Violence: Not on file    Family History  Problem Relation Age of Onset   Cancer Mother        colon   Heart disease Father        Died of Heart Attack      Review of systems complete and found to be negative unless listed above      PHYSICAL EXAM  General: Appears uncomfortable.  HEENT:  Normocephalic and atramatic Neck:  No JVD.  Lungs: Clear bilaterally to auscultation  and percussion. Heart: HRRR . Distant heart sounds.  Abdomen: Bowel sounds are positive, abdomen soft and non-tender  Msk:  Back normal, normal gait. Normal strength and tone for age. Extremities: No clubbing, cyanosis or edema.   Neuro: Alert and oriented X 3. Psych:  Good affect, responds appropriately  Labs:   Lab Results  Component Value Date   WBC 6.4 10/01/2020   HGB 15.0 10/01/2020   HCT 43.4 10/01/2020   MCV 98.2 10/01/2020   PLT 179 10/01/2020   No results for input(s): NA, K, CL, CO2, BUN, CREATININE, CALCIUM, PROT, BILITOT, ALKPHOS, ALT, AST, GLUCOSE in the last 168 hours.  Invalid input(s): LABALBU Lab Results  Component Value Date   CKTOTAL 102 03/29/2013   CKMB 2.9 03/29/2013   TROPONINI 0.83 (HH) 02/17/2018    Lab Results  Component Value Date   CHOL 144 09/30/2020   CHOL 247 (H) 08/22/2020   CHOL 190 08/30/2016   Lab Results  Component Value Date   HDL 42 09/30/2020   HDL 43 08/22/2020   HDL 33 (L) 08/30/2016   Lab Results  Component Value Date   LDLCALC 57 09/30/2020   LDLCALC 153 (H) 08/22/2020   LDLCALC UNABLE TO CALCULATE IF TRIGLYCERIDE OVER 400 mg/dL 16/07/9603   Lab Results  Component Value Date   TRIG 226 (H) 09/30/2020   TRIG 256 (H) 08/22/2020   TRIG 928 (H) 08/30/2016   Lab Results  Component Value Date   CHOLHDL 3.4 09/30/2020   CHOLHDL 5.7 08/22/2020   CHOLHDL 5.8 08/30/2016   No results found for: LDLDIRECT    Radiology: No results found.  EKG: Sinus rhythm. Inferior ST elevation. Significant ST depression V2-V4.   ASSESSMENT AND PLAN:  # Posterior STEMI # H/o Multivessel CAD with prior PCI to LAD, Lcx, RCA She presented after several hours of shortness of breath and chest pain, and was found to have 100% occluded mid left circumflex had a prior stenosis of 80%.  She underwent IVUS guided PCI to the mid left circumflex with placement of a 2.75 x 26 mm Onyx DES. -Continue aspirin 81 mg indefinitely -Switched from Plavix  to ticagrelor 90 mg twice daily -Continue atorvastatin 80 mg daily -Continue metoprolol XL 25 mg daily (decreased from 50) -Continue losartan 25 mg daily -Continue Imdur 30 mg daily -Echocardiogram complete today  Signed: Armando Reichert MD 07/30/2021, 7:26 PM

## 2021-07-31 NOTE — Progress Notes (Signed)
[facility]  - [place] at [facility]   PATIENT NAME: Sylvia Wiggins    MR#:  [id number]  DATE OF BIRTH:  Dec 28, 1960  SUBJECTIVE:   came in with significant chest pain. Was ruled in for STEMI. Patient went under cardiac cath with stent placement. Clinically stable. Denies any chest pain today. REVIEW OF SYSTEMS:   Review of Systems  Constitutional:  Negative for chills, fever and weight loss.  HENT:  Negative for ear discharge, ear pain and nosebleeds.   Eyes:  Negative for blurred vision, pain and discharge.  Respiratory:  Negative for sputum production, shortness of breath, wheezing and stridor.   Cardiovascular:  Negative for chest pain, palpitations, orthopnea and PND.  Gastrointestinal:  Negative for abdominal pain, diarrhea, nausea and vomiting.  Genitourinary:  Negative for frequency and urgency.  Musculoskeletal:  Negative for back pain and joint pain.  Neurological:  Negative for sensory change, speech change, focal weakness and weakness.  Psychiatric/Behavioral:  Negative for depression and hallucinations. The patient is not nervous/anxious.   Tolerating Diet: Tolerating PT:   DRUG ALLERGIES:   Allergies  Allergen Reactions   Other Other (See Comments)    Pt states the gas used to put her to sleep made her have a bad headache, "out of it for 3 days", made patient easy to anger   Sulfamethoxazole-Trimethoprim Nausea Only   Penicillins Nausea Only    Did it involve swelling of the face/tongue/throat, SOB, or low BP? No Did it involve sudden or severe rash/hives, skin peeling, or any reaction on the inside of your mouth or nose? No Did you need to seek medical attention at a hospital or doctor's office? No When did it last happen?      1990 If all above answers are "NO", may proceed with cephalosporin use.     VITALS:  Blood pressure 93/63, pulse 83, temperature 98.8 F (37.1 C), temperature source Oral, resp. rate 15, weight 66.1 kg, SpO2 99  %.  PHYSICAL EXAMINATION:   Physical Exam  GENERAL:  60 y.o.-year-old patient lying in the bed with no acute distress.  HEENT: Head atraumatic, normocephalic. Oropharynx and nasopharynx clear.  NECK:  Supple, no jugular venous distention. No thyroid enlargement, no tenderness.  LUNGS: Normal breath sounds bilaterally, no wheezing, rales, rhonchi. No use of accessory muscles of respiration.  CARDIOVASCULAR: S1, S2 normal. No murmurs, rubs, or gallops.  ABDOMEN: Soft, nontender, nondistended. Bowel sounds present. No organomegaly or mass.  EXTREMITIES: No cyanosis, clubbing or edema b/l.    NEUROLOGIC: Cranial nerves II through XII are intact. No focal Motor or sensory deficits b/l.   PSYCHIATRIC:  patient is alert and oriented x 3.  SKIN: No obvious rash, lesion, or ulcer.   LABORATORY PANEL:  CBC Recent Labs  Lab 07/31/21 0519  WBC 12.0*  HGB 16.5*  HCT 47.7*  PLT 253    Chemistries  Recent Labs  Lab 07/30/21 1922 07/31/21 0519  NA 135 141  K 3.9 4.1  CL 98 103  CO2 25 27  GLUCOSE 238* 169*  BUN 8 10  CREATININE 0.82 0.68  CALCIUM 9.2 9.3  AST 35  --   ALT 25  --   ALKPHOS 96  --   BILITOT 0.9  --    Cardiac Enzymes No results for input(s): TROPONINI in the last 168 hours. RADIOLOGY:  CARDIAC CATHETERIZATION  Result Date: 07/30/2021   Mid LAD to Dist LAD lesion is 75% stenosed.   Ost LAD lesion is 50%  stenosed.   Dist RCA lesion is 50% stenosed.   Ost 3rd Mrg to 3rd Mrg lesion is 90% stenosed.   RPAV lesion is 100% stenosed.   RPDA lesion is 95% stenosed.   Ost Cx to Prox Cx lesion is 100% stenosed.   Non-stenotic Prox LAD lesion was previously treated.   Non-stenotic Prox Cx to Dist Cx lesion was previously treated.   Non-stenotic Prox RCA to Mid RCA lesion was previously treated.   A drug-eluting stent was successfully placed using a STENT ONYX FRONTIER [id number].   Post intervention, there is a 0% residual stenosis.   LV end diastolic pressure is severely  elevated.   There is no aortic valve stenosis. Conclusion: Culprit for posterior STEMI is 100% occlusion in the mid circumflex. Severe coronary disease elsewhere with patent stent in the LAD, and patent stent in the proximal/mid RCA. Severely elevated LVEDP estimated at 43 mmHg. Successful IVUS guided PCI to the mid left circumflex with placement of a 2.75 x 26 mm Onyx DES with excellent angiographic result and TIMI-3 flow. Recommendation: Dual antiplatelet therapy with aspirin and ticagrelor for 12 months, ideally longer Chest x-ray upon arrival to the ICU.  Likely will require additional diuresis given her severely elevated EDP. Echocardiogram in the morning. Ongoing aggressive secondary prevention Follow-up with primary cardiologist 1 week following discharge.   DG Chest Port 1 View  Result Date: 07/30/2021 CLINICAL DATA:  Status post catheterization lab. EXAM: PORTABLE CHEST 1 VIEW COMPARISON:  September 29, 2020 FINDINGS: The lungs are hyperinflated. Overlying radiopaque cardiac lead wires and radiopaque tags are seen. Mild, diffusely increased interstitial lung markings are noted. This is slightly more prominent on the current study when compared to the prior exam. There is no evidence of focal consolidation, pleural effusion or pneumothorax. The heart size and mediastinal contours are within normal limits. A coronary artery stent is in place. The visualized skeletal structures are unremarkable. IMPRESSION: Mild, diffusely increased interstitial lung markings, which are likely, in part, chronic in nature. A mild, superimposed component of interstitial edema cannot be excluded. Electronically Signed   By: Aram Candela M.D.   On: 07/30/2021 21:11   ECHOCARDIOGRAM COMPLETE  Result Date: 07/31/2021    ECHOCARDIOGRAM REPORT   Patient Name:   Sylvia Wiggins Date of Exam: 07/31/2021 Medical Rec #:  [id number]      Height:       62.0 in Accession #:    [id number]     Weight:       145.7 lb Date of Birth:   [birth year]      BSA:          1.671 m Patient Age:    60 years       BP:           121/85 mmHg Patient Gender: F              HR:           87 bpm. Exam Location:  [facility] Procedure: 2D Echo, Cardiac Doppler and Color Doppler Indications:     Acute myocardial Infarction I21.9  History:         Patient has prior history of Echocardiogram examinations, most                  recent 08/22/2020. CAD and Previous Myocardial Infarction; Risk                  Factors:Dyslipidemia and Hypertension.  Sonographer:  Cristela Blue Referring Phys:  [id number] Trixie Deis ORGEL Diagnosing Phys: Sena Slate  Sonographer Comments: Suboptimal parasternal window and no subcostal window. IMPRESSIONS  1. Left ventricular ejection fraction, by estimation, is 50 to 55%. The left ventricle has normal function. The left ventricle demonstrates regional wall motion abnormalities (see scoring diagram/findings for description). Left ventricular diastolic parameters are consistent with Grade I diastolic dysfunction (impaired relaxation). There is moderate hypokinesis of the left ventricular, basal-mid inferolateral wall.  2. Right ventricular systolic function was not well visualized. The right ventricular size is not well visualized.  3. The mitral valve is normal in structure. Mild mitral valve regurgitation.  4. The aortic valve is grossly normal. Aortic valve regurgitation is mild. No aortic stenosis is present. FINDINGS  Left Ventricle: Left ventricular ejection fraction, by estimation, is 50 to 55%. The left ventricle has normal function. The left ventricle demonstrates regional wall motion abnormalities. Moderate hypokinesis of the left ventricular, basal-mid inferolateral wall. The left ventricular internal cavity size was normal in size. There is no left ventricular hypertrophy. Left ventricular diastolic parameters are consistent with Grade I diastolic dysfunction (impaired relaxation).  LV Wall Scoring: The antero-lateral wall and mid and  distal lateral wall are hypokinetic. Right Ventricle: The right ventricular size is not well visualized. Right vetricular wall thickness was not well visualized. Right ventricular systolic function was not well visualized. Left Atrium: Left atrial size was normal in size. Right Atrium: Right atrial size was normal in size. Pericardium: There is no evidence of pericardial effusion. Mitral Valve: The mitral valve is normal in structure. Mild mitral valve regurgitation. MV peak gradient, 3.0 mmHg. The mean mitral valve gradient is 1.0 mmHg. Tricuspid Valve: The tricuspid valve is not well visualized. Tricuspid valve regurgitation is not demonstrated. Aortic Valve: The aortic valve is grossly normal. Aortic valve regurgitation is mild. No aortic stenosis is present. Aortic valve mean gradient measures 2.0 mmHg. Aortic valve peak gradient measures 2.9 mmHg. Aortic valve area, by VTI measures 2.55 cm. Pulmonic Valve: The pulmonic valve was not well visualized. Pulmonic valve regurgitation is not visualized. Aorta: The aortic root is normal in size and structure. Venous: The inferior vena cava was not well visualized. IAS/Shunts: The interatrial septum was not assessed.  LEFT VENTRICLE PLAX 2D LVIDd:         3.42 cm   Diastology LVIDs:         2.70 cm   LV e' medial:    4.79 cm/s LV PW:         0.90 cm   LV E/e' medial:  9.7 LV IVS:        1.05 cm   LV e' lateral:   2.72 cm/s LVOT diam:     1.90 cm   LV E/e' lateral: 17.0 LV SV:         35 LV SV Index:   21 LVOT Area:     2.84 cm  RIGHT VENTRICLE RV S prime:     13.70 cm/s LEFT ATRIUM             Index        RIGHT ATRIUM           Index LA diam:        2.70 cm 1.62 cm/m   RA Area:     12.50 cm LA Vol (A2C):   22.4 ml 13.41 ml/m  RA Volume:   27.40 ml  16.40 ml/m LA Vol (A4C):   21.7 ml 12.99 ml/m  LA Biplane Vol: 22.2 ml 13.29 ml/m  AORTIC VALVE AV Area (Vmax):    1.82 cm AV Area (Vmean):   1.72 cm AV Area (VTI):     2.55 cm AV Vmax:           84.60 cm/s AV  Vmean:          59.700 cm/s AV VTI:            0.137 m AV Peak Grad:      2.9 mmHg AV Mean Grad:      2.0 mmHg LVOT Vmax:         54.20 cm/s LVOT Vmean:        36.300 cm/s LVOT VTI:          0.123 m LVOT/AV VTI ratio: 0.90  AORTA Ao Root diam: 3.00 cm MITRAL VALVE               TRICUSPID VALVE MV Area (PHT): 4.33 cm    TR Peak grad:   7.6 mmHg MV Area VTI:   2.07 cm    TR Vmax:        138.00 cm/s MV Peak grad:  3.0 mmHg MV Mean grad:  1.0 mmHg    SHUNTS MV Vmax:       0.86 m/s    Systemic VTI:  0.12 m MV Vmean:      44.7 cm/s   Systemic Diam: 1.90 cm MV Decel Time: 175 msec MV E velocity: 46.30 cm/s MV A velocity: 66.80 cm/s MV E/A ratio:  0.69 Sena Slate Electronically signed by Sena Slate Signature Date/Time: 07/31/2021/10:55:30 AM    Final    ASSESSMENT AND PLAN:   ALEXXA SABET is a 60 y.o. female with medical history significant for coronary artery disease status post PCI and stents, depression, dyslipidemia and hypertension who presented to the ER with acute code STEMI with acute onset of severe chest pain that started earlier this afternoon with associated worsening dyspnea around 2 PM.    Acute posterior wall STEMI  --pt underwent cath with post PCI and mid LCA stent with history of coronary artery disease. -on aspirin  Brilinta. - High-dose statin ,Imdur and Toprol-XL. - as needed sublingual nitroglycerin. -- Patient chest pain. Today okay to transfer out of ICU per cardiology  chronic diastolic CHF with subsequent acute hypoxic respiratory failure-- resolved - Patient is a history of grade 1 diastolic function with EF of 55 to 60% on a 2D echo less than a year ago, with trivial mitral regurgitation and mildly enlarged right ventricle. -  continue Toprol-XL and Cozaar. - Strict I's and O's. - Fluid restriction. - O2 protocol will be followed. -- Clinically does not appear to be in heart failure   Essential hypertension. - on Toprol-XL.   Dyslipidemia. - continue statin therapy.    DVT prophylaxis: Lovenox. Code Status: full code.  Procedures: cardiac cath Family communication : none Consults : cardiology CODE STATUS: full DVT Prophylaxis : Lovenox Level of care: Progressive Cardiac Status is: Inpatient  Remains inpatient appropriate because: improving from STEMI. Likely discharge tomorrow        TOTAL TIME TAKING CARE OF THIS PATIENT: 25 minutes.  >50% time spent on counselling and coordination of care  Note: This dictation was prepared with Dragon dictation along with smaller phrase technology. Any transcriptional errors that result from this process are unintentional.  Enedina Finner M.D    [facility]   CC: Primary care physician; Patient ID: Lavena Stanford  [place], female   DOB: Sep 16, 1961, 60 y.o.   MRN: [id number]

## 2021-07-31 NOTE — Progress Notes (Signed)
*  PRELIMINARY RESULTS* Echocardiogram 2D Echocardiogram has been performed.  Sylvia Wiggins 07/31/2021, 9:28 AM

## 2021-08-01 ENCOUNTER — Other Ambulatory Visit: Payer: Self-pay

## 2021-08-01 ENCOUNTER — Encounter: Payer: Self-pay | Admitting: Cardiology

## 2021-08-01 DIAGNOSIS — I213 ST elevation (STEMI) myocardial infarction of unspecified site: Secondary | ICD-10-CM | POA: Diagnosis not present

## 2021-08-01 LAB — BASIC METABOLIC PANEL
Anion gap: 7 (ref 5–15)
BUN: 13 mg/dL (ref 8–23)
CO2: 27 mmol/L (ref 22–32)
Calcium: 8.9 mg/dL (ref 8.9–10.3)
Chloride: 102 mmol/L (ref 98–111)
Creatinine, Ser: 0.75 mg/dL (ref 0.44–1.00)
GFR, Estimated: >60 mL/min (ref >60)
Glucose, Bld: 124 mg/dL — ABNORMAL HIGH (ref 70–99)
Potassium: 3.6 mmol/L (ref 3.5–5.1)
Sodium: 136 mmol/L (ref 135–145)

## 2021-08-01 LAB — HEMOGLOBIN A1C
Hgb A1c MFr Bld: 6.9 % — ABNORMAL HIGH (ref 4.8–5.6)
Mean Plasma Glucose: 151 mg/dL

## 2021-08-01 MED ORDER — TICAGRELOR 90 MG PO TABS
90.0000 mg | ORAL_TABLET | Freq: Two times a day (BID) | ORAL | 0 refills | Status: AC
  Filled 2021-08-01: qty 60, 30d supply, fill #0

## 2021-08-01 MED ORDER — TICAGRELOR 90 MG PO TABS: 90.0000 mg | ORAL_TABLET | Freq: Two times a day (BID) | ORAL | 12 refills | Status: AC

## 2021-08-01 MED ORDER — LOSARTAN POTASSIUM 25 MG PO TABS: 25.0000 mg | ORAL_TABLET | Freq: Every day | ORAL | 3 refills | Status: AC

## 2021-08-01 MED ORDER — METOPROLOL SUCCINATE ER 25 MG PO TB24: 25.0000 mg | ORAL_TABLET | Freq: Every day | ORAL | 3 refills | Status: AC

## 2021-08-01 MED ORDER — NITROGLYCERIN 0.4 MG SL SUBL: 0.4000 mg | SUBLINGUAL_TABLET | SUBLINGUAL | 0 refills | Status: AC | PRN

## 2021-08-01 MED ORDER — ISOSORBIDE MONONITRATE ER 30 MG PO TB24: 30.0000 mg | ORAL_TABLET | Freq: Every day | ORAL | 3 refills | Status: AC

## 2021-08-01 MED ORDER — ATORVASTATIN CALCIUM 80 MG PO TABS: 80.0000 mg | ORAL_TABLET | Freq: Every day | ORAL | 3 refills | Status: AC

## 2021-08-01 NOTE — Discharge Summary (Addendum)
[facility] [name] [facility]   PATIENT NAME: Sylvia Wiggins    MR#:  [id number]  DATE OF BIRTH:  10/09/1961  DATE OF ADMISSION:  07/30/2021 ADMITTING PHYSICIAN: Hannah Beat, MD  DATE OF DISCHARGE: 08/01/2021  PRIMARY CARE PHYSICIAN: Patient, [name] (Inactive)    ADMISSION DIAGNOSIS:  STEMI (ST elevation myocardial infarction) (HCC) [I21.3]  DISCHARGE DIAGNOSIS:  STEMI--inferior wall   SECONDARY DIAGNOSIS:   Past Medical History:  Diagnosis Date  . Abscess, gluteal, left    Recurrent drainage, followed by [facility].  . Arthritis   . CAD (coronary artery disease)    Stents in Clogged artery at [facility] June 2014  . Depression   . Elevated lipids   . Hyperlipidemia   . Hypertension   . Myocardial infarction [facility]) June 2014    HOSPITAL COURSE:  Sylvia Wiggins is a 60 y.o. female with medical history significant for coronary artery disease status post PCI and stents, depression, dyslipidemia and hypertension who presented to the ER with acute code STEMI with acute onset of severe chest pain that started earlier this afternoon with associated worsening dyspnea around 2 PM.     Acute posterior wall STEMI  --pt underwent cath with post PCI and mid LCA stent with history of coronary artery disease. -on aspirin , Brilinta. - High-dose statin ,Imdur, losartan and Toprol-XL. - as needed sublingual nitroglycerin. -- no CP, vitals stable  chronic diastolic CHF with subsequent acute hypoxic respiratory failure-- resolved - Patient is a history of grade 1 diastolic function with EF of 55 to 60% on a 2D echo less than a year ago, with trivial mitral regurgitation and mildly enlarged right ventricle. -  continue Toprol-XL and Cozaar. -- Clinically does not appear to be in heart failure.   Essential hypertension. - on Toprol-XL.   Dyslipidemia. - continue statin therapy.   DVT prophylaxis: Lovenox. Code Status: full code.   Procedures: cardiac cath Family  communication : none Consults : cardiology CODE STATUS: full DVT Prophylaxis : Lovenox Level of care: Progressive Cardiac Status is: Inpatient   patient is okay from cardiology standpoint for discharge.  CONSULTS OBTAINED:  Treatment Team:  Armando Reichert, MD  DRUG ALLERGIES:   Allergies  Allergen Reactions  . Other Other (See Comments)    Pt states the gas used to put her to sleep made her have a bad headache, "out of it for 3 days", made patient easy to anger  . Sulfamethoxazole-Trimethoprim Nausea Only  . Penicillins Nausea Only    Did it involve swelling of the face/tongue/throat, SOB, or low BP? No Did it involve sudden or severe rash/hives, skin peeling, or any reaction on the inside of your mouth or nose? No Did you need to seek medical attention at a hospital or doctor's office? No When did it last happen?      1990 If all above answers are "NO", may proceed with cephalosporin use.     DISCHARGE MEDICATIONS:   Allergies as of 08/01/2021       Reactions   Other Other (See Comments)   Pt states the gas used to put her to sleep made her have a bad headache, "out of it for 3 days", made patient easy to anger   Sulfamethoxazole-trimethoprim Nausea Only   Penicillins Nausea Only   Did it involve swelling of the face/tongue/throat, SOB, or low BP? No Did it involve sudden or severe rash/hives, skin peeling, or any reaction on the inside of  your mouth or nose? No Did you need to seek medical attention at a hospital or doctor's office? No When did it last happen?      1990 If all above answers are "NO", may proceed with cephalosporin use.        Medication List     STOP taking these medications    clopidogrel 75 MG tablet Commonly known as: PLAVIX       TAKE these medications    aspirin 81 MG EC tablet Take 1 tablet (81 mg total) by mouth daily. Swallow whole.   atorvastatin 80 MG tablet Commonly known as: LIPITOR Take 1 tablet (80 mg total) by  mouth daily.   gabapentin 300 MG capsule Commonly known as: NEURONTIN Take 300 mg by mouth 3 (three) times daily.   isosorbide mononitrate 30 MG 24 hr tablet Commonly known as: IMDUR Take 1 tablet (30 mg total) by mouth daily. Start taking on: August 02, 2021   losartan 25 MG tablet Commonly known as: COZAAR Take 1 tablet (25 mg total) by mouth daily.   metoprolol succinate 25 MG 24 hr tablet Commonly known as: TOPROL-XL Take 1 tablet (25 mg total) by mouth daily with breakfast. Take with or immediately following a meal. Start taking on: August 02, 2021 What changed:  medication strength how much to take when to take this   nitroGLYCERIN 0.4 MG SL tablet Commonly known as: NITROSTAT Place 1 tablet (0.4 mg total) under the tongue every 5 (five) minutes x 3 doses as needed for chest pain.   ticagrelor 90 MG Tabs tablet Commonly known as: BRILINTA Take 1 tablet (90 mg total) by mouth 2 (two) times daily.        If you experience worsening of your admission symptoms, develop shortness of breath, life threatening emergency, suicidal or homicidal thoughts you must seek medical attention immediately by calling 911 or calling your MD immediately  if symptoms less severe.  You Must read complete instructions/literature along with all the possible adverse reactions/side effects for all the Medicines you take and that have been prescribed to you. Take any new Medicines after you have completely understood and accept all the possible adverse reactions/side effects.   Please note  You were cared for by a hospitalist during your hospital stay. If you have any questions about your discharge medications or the care you received while you were in the hospital after you are discharged, you can call the unit and asked to speak with the hospitalist on call if the hospitalist that took care of you is not available. Once you are discharged, your primary care physician will handle any further  medical issues. Please note that NO REFILLS for any discharge medications will be authorized once you are discharged, as it is imperative that you return to your primary care physician (or establish a relationship with a primary care physician if you do not have one) for your aftercare needs so that they can reassess your need for medications and monitor your lab values. Today   SUBJECTIVE  no chest pain.   VITAL SIGNS:  Blood pressure 98/66, pulse 68, temperature (!) 97.2 F (36.2 C), temperature source Oral, resp. rate 18, weight 66.1 kg, SpO2 97 %.  I/O:   Intake/Output Summary (Last 24 hours) at 08/01/2021 1211 Last data filed at 07/31/2021 2104 Gross per 24 hour  Intake 3 ml  Output 625 ml  Net -622 ml    PHYSICAL EXAMINATION:  GENERAL:  60 y.o.-year-old patient lying  in the bed with no acute distress.  EYES: Pupils equal, round, reactive to light and accommodation. No scleral icterus.  HEENT: Head atraumatic, normocephalic. Oropharynx and nasopharynx clear.  NECK:  Supple, no jugular venous distention. No thyroid enlargement, no tenderness.  LUNGS: Normal breath sounds bilaterally, no wheezing, rales,rhonchi or crepitation. No use of accessory muscles of respiration.  CARDIOVASCULAR: S1, S2 normal. No murmurs, rubs, or gallops.  ABDOMEN: Soft, non-tender, non-distended. Bowel sounds present. No organomegaly or mass.  EXTREMITIES: No pedal edema, cyanosis, or clubbing.  NEUROLOGIC: Cranial nerves II through XII are intact. Muscle strength 5/5 in all extremities. Sensation intact. Gait not checked.  PSYCHIATRIC: The patient is alert and oriented x 3.  SKIN: No obvious rash, lesion, or ulcer.   DATA REVIEW:   CBC  Recent Labs  Lab 07/31/21 0519  WBC 12.0*  HGB 16.5*  HCT 47.7*  PLT 253    Chemistries  Recent Labs  Lab 07/30/21 1922 07/31/21 0519 08/01/21 0538  NA 135   < > 136  K 3.9   < > 3.6  CL 98   < > 102  CO2 25   < > 27  GLUCOSE 238*   < > 124*  BUN  8   < > 13  CREATININE 0.82   < > 0.75  CALCIUM 9.2   < > 8.9  AST 35  --   --   ALT 25  --   --   ALKPHOS 96  --   --   BILITOT 0.9  --   --    < > = values in this interval not displayed.    Microbiology Results   Recent Results (from the past 240 hour(s))  Resp Panel by RT-PCR (Flu A&B, Covid) Nasopharyngeal Swab     Status: None   Collection Time: 07/30/21  7:22 PM   Specimen: Nasopharyngeal Swab; Nasopharyngeal(NP) swabs in vial transport medium  Result Value Ref Range Status   SARS Coronavirus 2 by RT PCR NEGATIVE NEGATIVE Final    Comment: (NOTE) SARS-CoV-2 target nucleic acids are NOT DETECTED.  The SARS-CoV-2 RNA is generally detectable in upper respiratory specimens during the acute phase of infection. The lowest concentration of SARS-CoV-2 viral copies this assay can detect is 138 copies/mL. A negative result does not preclude SARS-Cov-2 infection and should not be used as the sole basis for treatment or other patient management decisions. A negative result may occur with  improper specimen collection/handling, submission of specimen other than nasopharyngeal swab, presence of viral mutation(s) within the areas targeted by this assay, and inadequate number of viral copies(<138 copies/mL). A negative result must be combined with clinical observations, patient history, and epidemiological information. The expected result is Negative.  Fact Sheet for Patients:  BloggerCourse.com  Fact Sheet for Healthcare Providers:  SeriousBroker.it  This test is no t yet approved or cleared by the [place] FDA and  has been authorized for detection and/or diagnosis of SARS-CoV-2 by FDA under an Emergency Use Authorization (EUA). This EUA will remain  in effect (meaning this test can be used) for the duration of the COVID-19 declaration under Section 564(b)(1) of the Act, 21 U.S.C.section 360bbb-3(b)(1), unless the  authorization is terminated  or revoked sooner.       Influenza A by PCR NEGATIVE NEGATIVE Final   Influenza B by PCR NEGATIVE NEGATIVE Final    Comment: (NOTE) The Xpert Xpress SARS-CoV-2/FLU/RSV plus assay is intended as an aid in the diagnosis of influenza from  Nasopharyngeal swab specimens and should not be used as a sole basis for treatment. Nasal washings and aspirates are unacceptable for Xpert Xpress SARS-CoV-2/FLU/RSV testing.  Fact Sheet for Patients: BloggerCourse.com  Fact Sheet for Healthcare Providers: SeriousBroker.it  This test is not yet approved or cleared by the [place] FDA and has been authorized for detection and/or diagnosis of SARS-CoV-2 by FDA under an Emergency Use Authorization (EUA). This EUA will remain in effect (meaning this test can be used) for the duration of the COVID-19 declaration under Section 564(b)(1) of the Act, 21 U.S.C. section 360bbb-3(b)(1), unless the authorization is terminated or revoked.  Performed at [facility], [address], [place], [place] [address]     RADIOLOGY:  CARDIAC CATHETERIZATION  Result Date: 07/30/2021 .  Mid LAD to Dist LAD lesion is 75% stenosed. Suezanne Jacquet LAD lesion is 50% stenosed. .  Dist RCA lesion is 50% stenosed. Suezanne Jacquet 3rd Mrg to 3rd Mrg lesion is 90% stenosed. Marland Kitchen  RPAV lesion is 100% stenosed. Marland Kitchen  RPDA lesion is 95% stenosed. Suezanne Jacquet Cx to Prox Cx lesion is 100% stenosed. .  Non-stenotic Prox LAD lesion was previously treated. .  Non-stenotic Prox Cx to Dist Cx lesion was previously treated. .  Non-stenotic Prox RCA to Mid RCA lesion was previously treated. .  A drug-eluting stent was successfully placed using a STENT ONYX FRONTIER 2.75X26. Marland Kitchen  Post intervention, there is a 0% residual stenosis. .  LV end diastolic pressure is severely elevated. .  There is no aortic valve stenosis. Conclusion: Culprit for posterior STEMI is 100% occlusion in the  mid circumflex. Severe coronary disease elsewhere with patent stent in the LAD, and patent stent in the proximal/mid RCA. Severely elevated LVEDP estimated at 43 mmHg. Successful IVUS guided PCI to the mid left circumflex with placement of a 2.75 x 26 mm Onyx DES with excellent angiographic result and TIMI-3 flow. Recommendation: Dual antiplatelet therapy with aspirin and ticagrelor for 12 months, ideally longer Chest x-ray upon arrival to the ICU.  Likely will require additional diuresis given her severely elevated EDP. Echocardiogram in the morning. Ongoing aggressive secondary prevention Follow-up with primary cardiologist 1 week following discharge.   DG Chest Port 1 View  Result Date: 07/30/2021 CLINICAL DATA:  Status post catheterization lab. EXAM: PORTABLE CHEST 1 VIEW COMPARISON:  September 29, 2020 FINDINGS: The lungs are hyperinflated. Overlying radiopaque cardiac lead wires and radiopaque tags are seen. Mild, diffusely increased interstitial lung markings are noted. This is slightly more prominent on the current study when compared to the prior exam. There is no evidence of focal consolidation, pleural effusion or pneumothorax. The heart size and mediastinal contours are within normal limits. A coronary artery stent is in place. The visualized skeletal structures are unremarkable. IMPRESSION: Mild, diffusely increased interstitial lung markings, which are likely, in part, chronic in nature. A mild, superimposed component of interstitial edema cannot be excluded. Electronically Signed   By: Aram Candela M.D.   On: 07/30/2021 21:11   ECHOCARDIOGRAM COMPLETE  Result Date: 07/31/2021    ECHOCARDIOGRAM REPORT   Patient Name:   MAURISA TESMER Date of Exam: 07/31/2021 Medical Rec #:  [id number]      Height:       62.0 in Accession #:    [id number]     Weight:       145.7 lb Date of Birth:  08-11-1961      BSA:  1.671 m Patient Age:    60 years       BP:           121/85 mmHg Patient Gender:  F              HR:           87 bpm. Exam Location:  [facility] Procedure: 2D Echo, Cardiac Doppler and Color Doppler Indications:     Acute myocardial Infarction I21.9  History:         Patient has prior history of Echocardiogram examinations, most                  recent 08/22/2020. CAD and Previous Myocardial Infarction; Risk                  Factors:Dyslipidemia and Hypertension.  Sonographer:     Cristela Blue Referring Phys:  [id number] Trixie Deis ORGEL Diagnosing Phys: Sena Slate  Sonographer Comments: Suboptimal parasternal window and no subcostal window. IMPRESSIONS  1. Left ventricular ejection fraction, by estimation, is 50 to 55%. The left ventricle has normal function. The left ventricle demonstrates regional wall motion abnormalities (see scoring diagram/findings for description). Left ventricular diastolic parameters are consistent with Grade I diastolic dysfunction (impaired relaxation). There is moderate hypokinesis of the left ventricular, basal-mid inferolateral wall.  2. Right ventricular systolic function was not well visualized. The right ventricular size is not well visualized.  3. The mitral valve is normal in structure. Mild mitral valve regurgitation.  4. The aortic valve is grossly normal. Aortic valve regurgitation is mild. No aortic stenosis is present. FINDINGS  Left Ventricle: Left ventricular ejection fraction, by estimation, is 50 to 55%. The left ventricle has normal function. The left ventricle demonstrates regional wall motion abnormalities. Moderate hypokinesis of the left ventricular, basal-mid inferolateral wall. The left ventricular internal cavity size was normal in size. There is no left ventricular hypertrophy. Left ventricular diastolic parameters are consistent with Grade I diastolic dysfunction (impaired relaxation).  LV Wall Scoring: The antero-lateral wall and mid and distal lateral wall are hypokinetic. Right Ventricle: The right ventricular size is not well visualized. Right  vetricular wall thickness was not well visualized. Right ventricular systolic function was not well visualized. Left Atrium: Left atrial size was normal in size. Right Atrium: Right atrial size was normal in size. Pericardium: There is no evidence of pericardial effusion. Mitral Valve: The mitral valve is normal in structure. Mild mitral valve regurgitation. MV peak gradient, 3.0 mmHg. The mean mitral valve gradient is 1.0 mmHg. Tricuspid Valve: The tricuspid valve is not well visualized. Tricuspid valve regurgitation is not demonstrated. Aortic Valve: The aortic valve is grossly normal. Aortic valve regurgitation is mild. No aortic stenosis is present. Aortic valve mean gradient measures 2.0 mmHg. Aortic valve peak gradient measures 2.9 mmHg. Aortic valve area, by VTI measures 2.55 cm. Pulmonic Valve: The pulmonic valve was not well visualized. Pulmonic valve regurgitation is not visualized. Aorta: The aortic root is normal in size and structure. Venous: The inferior vena cava was not well visualized. IAS/Shunts: The interatrial septum was not assessed.  LEFT VENTRICLE PLAX 2D LVIDd:         3.42 cm   Diastology LVIDs:         2.70 cm   LV e' medial:    4.79 cm/s LV PW:         0.90 cm   LV E/e' medial:  9.7 LV IVS:  1.05 cm   LV e' lateral:   2.72 cm/s LVOT diam:     1.90 cm   LV E/e' lateral: 17.0 LV SV:         35 LV SV Index:   21 LVOT Area:     2.84 cm  RIGHT VENTRICLE RV S prime:     13.70 cm/s LEFT ATRIUM             Index        RIGHT ATRIUM           Index LA diam:        2.70 cm 1.62 cm/m   RA Area:     12.50 cm LA Vol (A2C):   22.4 ml 13.41 ml/m  RA Volume:   27.40 ml  16.40 ml/m LA Vol (A4C):   21.7 ml 12.99 ml/m LA Biplane Vol: 22.2 ml 13.29 ml/m  AORTIC VALVE AV Area (Vmax):    1.82 cm AV Area (Vmean):   1.72 cm AV Area (VTI):     2.55 cm AV Vmax:           84.60 cm/s AV Vmean:          59.700 cm/s AV VTI:            0.137 m AV Peak Grad:      2.9 mmHg AV Mean Grad:      2.0 mmHg LVOT  Vmax:         54.20 cm/s LVOT Vmean:        36.300 cm/s LVOT VTI:          0.123 m LVOT/AV VTI ratio: 0.90  AORTA Ao Root diam: 3.00 cm MITRAL VALVE               TRICUSPID VALVE MV Area (PHT): 4.33 cm    TR Peak grad:   7.6 mmHg MV Area VTI:   2.07 cm    TR Vmax:        138.00 cm/s MV Peak grad:  3.0 mmHg MV Mean grad:  1.0 mmHg    SHUNTS MV Vmax:       0.86 m/s    Systemic VTI:  0.12 m MV Vmean:      44.7 cm/s   Systemic Diam: 1.90 cm MV Decel Time: 175 msec MV E velocity: 46.30 cm/s MV A velocity: 66.80 cm/s MV E/A ratio:  0.69 Sena Slate Electronically signed by Sena Slate Signature Date/Time: 07/31/2021/10:55:30 AM    Final      CODE STATUS:     Code Status Orders  (From admission, onward)           Start     Ordered   07/30/21 2046  Full code  Continuous        07/30/21 2045           Code Status History     Date Active Date Inactive Code Status Order ID Comments User Context   09/29/2020 2256 10/01/2020 1824 Full Code [id number]  Andris Baumann, MD ED   08/21/2020 1309 08/24/2020 1957 Full Code [id number]  Lucile Shutters, MD ED   02/18/2018 1350 02/18/2018 2006 Full Code [id number]  Dalia Heading, MD Inpatient   02/16/2018 2205 02/18/2018 1350 Full Code [id number]  Marguarite Arbour, MD Inpatient   08/30/2016 0115 08/31/2016 1517 Full Code [id number]  Hugelmeyer, Jon Gills, DO Inpatient        TOTAL TIME TAKING CARE OF THIS PATIENT: 40 minutes.  Enedina Finner M.D  [facility]    CC: Primary care physician; Patient, [name] (Inactive)

## 2021-08-01 NOTE — Discharge Instructions (Signed)
Advise smoking cessation 

## 2021-08-01 NOTE — Progress Notes (Addendum)
Discharge teaching reviewed with patient. All questions answered. Follow up appointment made with [facility] cardiology but since patient had been seeing Dr. Juliann Pares, [facility] staff said appointment had to be made with Dr. Juliann Pares. Patient made aware of this and instructed to call [facility] to double check appointment in light of this. Patient walked multiple laps around ICU with NT and several times up to [facility] with no assistance needed or difficulties. Reports she does have a walker at home if she needs. All instructions verbalized back to RN. [facility] assisted patient in getting first month of brilinta filled prior to discharge. Currently waiting on patient's copay for brilinta and her ride.

## 2021-08-01 NOTE — Consult Note (Signed)
[facility] Cardiology  CARDIOLOGY CONSULT NOTE  Patient ID: Sylvia Wiggins MRN: [id number] DOB/AGE: 1961-02-24 60 y.o.  Admit date: 07/30/2021 Referring Physician ED Primary Physician Patient, [name] (Inactive) Primary Cardiologist [facility] Reason for Consultation STEMI  HPI:  Sylvia Wiggins is a 60 year old female with a history of CAD (multivessel CAD with stents in the LAD, LCx and RCA), hypertension, hyperlipidemia, ongoing tobacco use who presents with chest pain and shortness of breath and was discovered to have a posterior STEMI. S/p catheterization with placement of 1 DES to mLCx.   Interval History: - Feels well this morning. Wants to go home. No chest pain or shortness of breath.   Review of systems complete and found to be negative unless listed above    Past Medical History:  Diagnosis Date   Abscess, gluteal, left    Recurrent drainage, followed by [facility].   Arthritis    CAD (coronary artery disease)    Stents in Clogged artery at [facility] June 2014   Depression    Elevated lipids    Hyperlipidemia    Hypertension    Myocardial infarction [facility]) June 2014    Past Surgical History:  Procedure Laterality Date   CARDIAC CATHETERIZATION N/A 08/30/2016   Procedure: Left Heart Cath and Coronary Angiography;  Surgeon: Marcina Millard, MD;  Location: [facility] INVASIVE CV LAB;  Service: Cardiovascular;  Laterality: N/A;   CARDIAC SURGERY  03/30/2013   stent placement   CORONARY ANGIOPLASTY     cardiac stents x 2   CORONARY STENT INTERVENTION N/A 08/23/2020   Procedure: CORONARY STENT INTERVENTION;  Surgeon: Alwyn Pea, MD;  Location: [facility] INVASIVE CV LAB;  Service: Cardiovascular;  Laterality: N/A;   INGUINAL HERNIA REPAIR Right 02/26/2020   Procedure: HERNIA REPAIR INGUINAL ADULT;  Surgeon: Sung Amabile, DO;  Location: [facility] ORS;  Service: General;  Laterality: Right;   LEFT HEART CATH AND CORONARY ANGIOGRAPHY N/A 08/23/2020   Procedure: LEFT HEART CATH AND  CORONARY ANGIOGRAPHY and possible PCI and stent;  Surgeon: Alwyn Pea, MD;  Location: [facility] INVASIVE CV LAB;  Service: Cardiovascular;  Laterality: N/A;   LEFT HEART CATH AND CORS/GRAFTS ANGIOGRAPHY N/A 02/18/2018   Procedure: LEFT HEART CATH AND CORS/GRAFTS ANGIOGRAPHY;  Surgeon: Dalia Heading, MD;  Location: [facility] INVASIVE CV LAB;  Service: Cardiovascular;  Laterality: N/A;   RECTAL SURGERY  Anal Fissure   XI ROBOTIC ASSISTED INGUINAL HERNIA REPAIR WITH MESH Right 05/16/2019   Procedure: XI ROBOTIC ASSISTED Right INGUINAL HERNIA REPAIR WITH MESH;  Surgeon: Sung Amabile, DO;  Location: [facility] ORS;  Service: General;  Laterality: Right;    (Not in a hospital admission)  Social History   Socioeconomic History   Marital status: Legally Separated    Spouse name: Not on file   Number of children: Not on file   Years of education: Not on file   Highest education level: Not on file  Occupational History   Not on file  Tobacco Use   Smoking status: Every Day    Packs/day: 0.25    Types: Cigarettes   Smokeless tobacco: Never  Vaping Use   Vaping Use: Never used  Substance and Sexual Activity   Alcohol use: No    Alcohol/week: 0.0 standard drinks   Drug use: No   Sexual activity: Never  Other Topics Concern   Not on file  Social History Narrative   Not on file   Social Determinants of Health   Financial Resource Strain: Not on file  Food Insecurity: Not on file  Transportation Needs: Not on file  Physical Activity: Not on file  Stress: Not on file  Social Connections: Not on file  Intimate Partner Violence: Not on file    Family History  Problem Relation Age of Onset   Cancer Mother        colon   Heart disease Father        Died of Heart Attack      Review of systems complete and found to be negative unless listed above      PHYSICAL EXAM  General: Appears uncomfortable.  HEENT:  Normocephalic and atramatic Neck:  No JVD.  Lungs: Clear bilaterally to  auscultation and percussion. Heart: HRRR . Distant heart sounds.  Abdomen: Bowel sounds are positive, abdomen soft and non-tender  Msk:  Back normal, normal gait. Normal strength and tone for age. Extremities: No clubbing, cyanosis or edema.   Neuro: Alert and oriented X 3. Psych:  Good affect, responds appropriately  Labs:   Lab Results  Component Value Date   WBC 6.4 10/01/2020   HGB 15.0 10/01/2020   HCT 43.4 10/01/2020   MCV 98.2 10/01/2020   PLT 179 10/01/2020   No results for input(s): NA, K, CL, CO2, BUN, CREATININE, CALCIUM, PROT, BILITOT, ALKPHOS, ALT, AST, GLUCOSE in the last 168 hours.  Invalid input(s): LABALBU Lab Results  Component Value Date   CKTOTAL 102 03/29/2013   CKMB 2.9 03/29/2013   TROPONINI 0.83 (HH) 02/17/2018    Lab Results  Component Value Date   CHOL 144 09/30/2020   CHOL 247 (H) 08/22/2020   CHOL 190 08/30/2016   Lab Results  Component Value Date   HDL 42 09/30/2020   HDL 43 08/22/2020   HDL 33 (L) 08/30/2016   Lab Results  Component Value Date   LDLCALC 57 09/30/2020   LDLCALC 153 (H) 08/22/2020   LDLCALC UNABLE TO CALCULATE IF TRIGLYCERIDE OVER 400 mg/dL 09/62/8366   Lab Results  Component Value Date   TRIG 226 (H) 09/30/2020   TRIG 256 (H) 08/22/2020   TRIG 928 (H) 08/30/2016   Lab Results  Component Value Date   CHOLHDL 3.4 09/30/2020   CHOLHDL 5.7 08/22/2020   CHOLHDL 5.8 08/30/2016   No results found for: LDLDIRECT    Radiology: No results found.  EKG: Sinus rhythm. Inferior ST elevation. Significant ST depression V2-V4.   ASSESSMENT AND PLAN:  # Posterior STEMI # H/o Multivessel CAD with prior PCI to LAD, Lcx, RCA She presented after several hours of shortness of breath and chest pain, and was found to have 100% occluded mid left circumflex had a prior stenosis of 80%.  She underwent IVUS guided PCI to the mid left circumflex with placement of a 2.75 x 26 mm Onyx DES. -Continue aspirin 81 mg indefinitely -  Ticagrelor 90 mg twice daily for at least 1 year; favor indefinite P2Y12 therapy.  -Continue atorvastatin 80 mg daily -Continue metoprolol XL 25 mg daily (decreased from 50) -Continue losartan 25 mg daily -Continue Imdur 30 mg daily -Follow up with me 1-2 weeks after discharge. Please be sure this is scheduled prior to DC.  - She is ok to discharge today.   Signed: Armando Reichert MD 07/30/2021, 7:26 PM

## 2023-10-10 DIAGNOSIS — I639 Cerebral infarction, unspecified: Secondary | ICD-10-CM

## 2023-10-10 HISTORY — DX: Cerebral infarction, unspecified: I63.9

## 2024-02-05 ENCOUNTER — Other Ambulatory Visit: Payer: Self-pay

## 2024-05-11 ENCOUNTER — Observation Stay

## 2024-05-11 ENCOUNTER — Other Ambulatory Visit: Payer: Self-pay

## 2024-05-11 ENCOUNTER — Emergency Department

## 2024-05-11 ENCOUNTER — Inpatient Hospital Stay
Admission: EM | Admit: 2024-05-11 | Discharge: 2024-05-13 | DRG: 064 | Disposition: A | Discharge status: Home / Self Care | Attending: Internal Medicine | Admitting: Internal Medicine

## 2024-05-11 DIAGNOSIS — I6389 Other cerebral infarction: Principal | ICD-10-CM | POA: Diagnosis present

## 2024-05-11 DIAGNOSIS — Z955 Presence of coronary angioplasty implant and graft: Secondary | ICD-10-CM

## 2024-05-11 DIAGNOSIS — Z7982 Long term (current) use of aspirin: Secondary | ICD-10-CM

## 2024-05-11 DIAGNOSIS — I252 Old myocardial infarction: Secondary | ICD-10-CM

## 2024-05-11 DIAGNOSIS — R29703 NIHSS score 3: Secondary | ICD-10-CM | POA: Diagnosis present

## 2024-05-11 DIAGNOSIS — F32A Depression, unspecified: Secondary | ICD-10-CM | POA: Diagnosis present

## 2024-05-11 DIAGNOSIS — F17210 Nicotine dependence, cigarettes, uncomplicated: Secondary | ICD-10-CM | POA: Diagnosis present

## 2024-05-11 DIAGNOSIS — G9341 Metabolic encephalopathy: Secondary | ICD-10-CM | POA: Diagnosis present

## 2024-05-11 DIAGNOSIS — Z79899 Other long term (current) drug therapy: Secondary | ICD-10-CM

## 2024-05-11 DIAGNOSIS — I2510 Atherosclerotic heart disease of native coronary artery without angina pectoris: Secondary | ICD-10-CM | POA: Diagnosis present

## 2024-05-11 DIAGNOSIS — I5189 Other ill-defined heart diseases: Secondary | ICD-10-CM | POA: Diagnosis present

## 2024-05-11 DIAGNOSIS — G8324 Monoplegia of upper limb affecting left nondominant side: Secondary | ICD-10-CM | POA: Diagnosis present

## 2024-05-11 DIAGNOSIS — E785 Hyperlipidemia, unspecified: Secondary | ICD-10-CM | POA: Diagnosis present

## 2024-05-11 DIAGNOSIS — R29898 Other symptoms and signs involving the musculoskeletal system: Secondary | ICD-10-CM

## 2024-05-11 DIAGNOSIS — R29810 Facial weakness: Secondary | ICD-10-CM | POA: Diagnosis present

## 2024-05-11 DIAGNOSIS — I639 Cerebral infarction, unspecified: Secondary | ICD-10-CM | POA: Diagnosis not present

## 2024-05-11 DIAGNOSIS — I10 Essential (primary) hypertension: Secondary | ICD-10-CM | POA: Diagnosis present

## 2024-05-11 DIAGNOSIS — Z8249 Family history of ischemic heart disease and other diseases of the circulatory system: Secondary | ICD-10-CM

## 2024-05-11 DIAGNOSIS — R4781 Slurred speech: Secondary | ICD-10-CM | POA: Diagnosis present

## 2024-05-11 DIAGNOSIS — Z882 Allergy status to sulfonamides status: Secondary | ICD-10-CM

## 2024-05-11 DIAGNOSIS — Z880 Allergy status to penicillin: Secondary | ICD-10-CM

## 2024-05-11 DIAGNOSIS — Z1152 Encounter for screening for COVID-19: Secondary | ICD-10-CM

## 2024-05-11 LAB — DIFFERENTIAL
Abs Immature Granulocytes: 0.03 K/uL (ref 0.00–0.07)
Basophils Absolute: 0.1 K/uL (ref 0.0–0.1)
Basophils Relative: 1 %
Eosinophils Absolute: 0.1 K/uL (ref 0.0–0.5)
Eosinophils Relative: 1 %
Immature Granulocytes: 0 %
Lymphocytes Relative: 21 %
Lymphs Abs: 1.7 K/uL (ref 0.7–4.0)
Monocytes Absolute: 0.4 K/uL (ref 0.1–1.0)
Monocytes Relative: 5 %
Neutro Abs: 5.5 K/uL (ref 1.7–7.7)
Neutrophils Relative %: 72 %

## 2024-05-11 LAB — COMPREHENSIVE METABOLIC PANEL WITH GFR
ALT: 20 U/L (ref 0–44)
AST: 25 U/L (ref 15–41)
Albumin: 4.6 g/dL (ref 3.5–5.0)
Alkaline Phosphatase: 77 U/L (ref 38–126)
Anion gap: 14 (ref 5–15)
BUN: 14 mg/dL (ref 8–23)
CO2: 21 mmol/L — ABNORMAL LOW (ref 22–32)
Calcium: 10 mg/dL (ref 8.9–10.3)
Chloride: 102 mmol/L (ref 98–111)
Creatinine, Ser: 0.89 mg/dL (ref 0.44–1.00)
GFR, Estimated: >60 mL/min (ref >60)
Glucose, Bld: 102 mg/dL — ABNORMAL HIGH (ref 70–99)
Potassium: 3.6 mmol/L (ref 3.5–5.1)
Sodium: 137 mmol/L (ref 135–145)
Total Bilirubin: 1.2 mg/dL (ref 0.0–1.2)
Total Protein: 8.3 g/dL — ABNORMAL HIGH (ref 6.5–8.1)

## 2024-05-11 LAB — CBC
HCT: 48.5 % — ABNORMAL HIGH (ref 36.0–46.0)
Hemoglobin: 16.9 g/dL — ABNORMAL HIGH (ref 12.0–15.0)
MCH: 34.3 pg — ABNORMAL HIGH (ref 26.0–34.0)
MCHC: 34.8 g/dL (ref 30.0–36.0)
MCV: 98.6 fL (ref 80.0–100.0)
Platelets: 258 K/uL (ref 150–400)
RBC: 4.92 MIL/uL (ref 3.87–5.11)
RDW: 12.2 % (ref 11.5–15.5)
WBC: 7.8 K/uL (ref 4.0–10.5)
nRBC: 0 % (ref 0.0–0.2)

## 2024-05-11 LAB — APTT: aPTT: <22 s — ABNORMAL LOW (ref 24–36)

## 2024-05-11 LAB — PROTIME-INR
INR: 1 (ref 0.8–1.2)
Prothrombin Time: 13.9 s (ref 11.4–15.2)

## 2024-05-11 LAB — ETHANOL: Alcohol, Ethyl (B): <15 mg/dL (ref <15)

## 2024-05-11 MED ORDER — ACETAMINOPHEN 160 MG/5ML PO SOLN: 650.0000 mg | ORAL | Status: DC | PRN

## 2024-05-11 MED ORDER — IOHEXOL 350 MG/ML SOLN
75.0000 mL | Freq: Once | INTRAVENOUS | Status: AC | PRN
  Administered 2024-05-11: 75 mL via INTRAVENOUS

## 2024-05-11 MED ORDER — SODIUM CHLORIDE 0.9% FLUSH
3.0000 mL | Freq: Once | INTRAVENOUS | Status: AC
  Administered 2024-05-11: 3 mL via INTRAVENOUS

## 2024-05-11 MED ORDER — ASPIRIN 81 MG PO TBEC
81.0000 mg | DELAYED_RELEASE_TABLET | Freq: Every day | ORAL | Status: DC
  Administered 2024-05-12 – 2024-05-13 (×2): 81 mg via ORAL
  Filled 2024-05-11 (×2): qty 1

## 2024-05-11 MED ORDER — ATORVASTATIN CALCIUM 20 MG PO TABS
80.0000 mg | ORAL_TABLET | Freq: Every day | ORAL | Status: DC
  Administered 2024-05-12 – 2024-05-13 (×2): 80 mg via ORAL
  Filled 2024-05-11 (×2): qty 4

## 2024-05-11 MED ORDER — METOPROLOL SUCCINATE ER 25 MG PO TB24
25.0000 mg | ORAL_TABLET | Freq: Every day | ORAL | Status: DC
  Administered 2024-05-12 – 2024-05-13 (×2): 25 mg via ORAL
  Filled 2024-05-11 (×2): qty 1

## 2024-05-11 MED ORDER — ENOXAPARIN SODIUM 40 MG/0.4ML IJ SOSY
40.0000 mg | PREFILLED_SYRINGE | INTRAMUSCULAR | Status: DC
  Administered 2024-05-11 – 2024-05-12 (×2): 40 mg via SUBCUTANEOUS
  Filled 2024-05-11 (×2): qty 0.4

## 2024-05-11 MED ORDER — SENNOSIDES-DOCUSATE SODIUM 8.6-50 MG PO TABS: 1.0000 | ORAL_TABLET | Freq: Every evening | ORAL | Status: DC | PRN

## 2024-05-11 MED ORDER — ACETAMINOPHEN 325 MG PO TABS: 650.0000 mg | ORAL_TABLET | ORAL | Status: DC | PRN

## 2024-05-11 MED ORDER — SODIUM CHLORIDE 0.9 % IV SOLN: INTRAVENOUS | Status: AC

## 2024-05-11 MED ORDER — ORAL CARE MOUTH RINSE: 15.0000 mL | OROMUCOSAL | Status: DC | PRN

## 2024-05-11 MED ORDER — LOSARTAN POTASSIUM 25 MG PO TABS
25.0000 mg | ORAL_TABLET | Freq: Every day | ORAL | Status: DC
  Administered 2024-05-12 – 2024-05-13 (×2): 25 mg via ORAL
  Filled 2024-05-11 (×2): qty 1

## 2024-05-11 MED ORDER — ACETAMINOPHEN 650 MG RE SUPP: 650.0000 mg | RECTAL | Status: DC | PRN

## 2024-05-11 MED ORDER — ISOSORBIDE MONONITRATE ER 30 MG PO TB24
30.0000 mg | ORAL_TABLET | Freq: Every day | ORAL | Status: DC
  Administered 2024-05-12 – 2024-05-13 (×2): 30 mg via ORAL
  Filled 2024-05-11 (×2): qty 1

## 2024-05-11 MED ORDER — LOSARTAN POTASSIUM 25 MG PO TABS: 25.0000 mg | ORAL_TABLET | Freq: Every day | ORAL | Status: DC

## 2024-05-11 MED ORDER — CLOPIDOGREL BISULFATE 75 MG PO TABS
75.0000 mg | ORAL_TABLET | Freq: Every day | ORAL | Status: DC
  Administered 2024-05-11 – 2024-05-13 (×3): 75 mg via ORAL
  Filled 2024-05-11 (×3): qty 1

## 2024-05-11 MED ORDER — NITROGLYCERIN 0.4 MG SL SUBL: 0.4000 mg | SUBLINGUAL_TABLET | SUBLINGUAL | Status: DC | PRN

## 2024-05-11 MED ORDER — STROKE: EARLY STAGES OF RECOVERY BOOK: Freq: Once | Status: AC

## 2024-05-11 MED ORDER — METOPROLOL SUCCINATE ER 25 MG PO TB24: 25.0000 mg | ORAL_TABLET | Freq: Every day | ORAL | Status: DC

## 2024-05-11 NOTE — ED Notes (Addendum)
 RN verified with family that LKW was over 3 days ago.

## 2024-05-11 NOTE — ED Provider Notes (Signed)
 [facility] Provider Note   Event Date/Time   First MD Initiated Contact with Patient 05/11/24 1806     (approximate) History  Altered Mental Status  HPI Sylvia Wiggins is a 63 y.o. female with a past medical history of hypertension, CAD, and hyperlipidemia who presents complaining of of confusion, left-sided weakness, left-sided facial droop, and slurred speech that began 3 days prior to arrival.  Patient states that symptoms have been improving over the last 3 days however are still present.  Patient is alert and oriented to self and situation. ROS: Patient currently denies any vision changes, tinnitus, sore throat, chest pain, shortness of breath, abdominal pain, nausea/vomiting/diarrhea, dysuria, or numbness/paresthesias in any extremity   Physical Exam  Triage Vital Signs: ED Triage Vitals  Encounter Vitals Group     BP 05/11/24 1751 (!) 141/102     Girls Systolic BP Percentile --      Girls Diastolic BP Percentile --      Boys Systolic BP Percentile --      Boys Diastolic BP Percentile --      Pulse Rate 05/11/24 1751 100     Resp 05/11/24 1751 17     Temp 05/11/24 1751 (!) 97.5 F (36.4 C)     Temp src --      SpO2 05/11/24 1751 100 %     Weight 05/11/24 1752 145 lb 11.6 oz (66.1 kg)     Height 05/11/24 1752 5' 2 (1.575 m)     Head Circumference --      Peak Flow --      Pain Score 05/11/24 1752 0     Pain Loc --      Pain Education --      Exclude from Growth Chart --    Most recent vital signs: Vitals:   05/11/24 2052 05/11/24 2053  BP: (!) 156/76 (!) 156/86  Pulse: 92 79  Resp: 12 12  Temp: 98.1 F (36.7 C) 98.1 F (36.7 C)  SpO2:  100%   General: Awake, oriented x4. CV:  Good peripheral perfusion. Resp:  Normal effort. Abd:  No distention. Other:  Elderly well-developed, well-nourished Caucasian female resting comfortably in no acute distress.  Left-sided facial droop.  Left upper extremity drift ED Results / Procedures /  Treatments  Labs (all labs ordered are listed, but only abnormal results are displayed) Labs Reviewed  CBC - Abnormal; Notable for the following components:      Result Value   Hemoglobin 16.9 (*)    HCT 48.5 (*)    MCH 34.3 (*)    All other components within normal limits  COMPREHENSIVE METABOLIC PANEL WITH GFR - Abnormal; Notable for the following components:   CO2 21 (*)    Glucose, Bld 102 (*)    Total Protein 8.3 (*)    All other components within normal limits  APTT - Abnormal; Notable for the following components:   aPTT <22 (*)    All other components within normal limits  DIFFERENTIAL  ETHANOL  PROTIME-INR  LIPID PANEL  HEMOGLOBIN A1C  HIV ANTIBODY (ROUTINE TESTING W REFLEX)  I-STAT CREATININE, ED  CBG MONITORING, ED   EKG ED ECG REPORT I, Artist MARLA Kerns, the attending physician, personally viewed and interpreted this ECG. Date: 05/11/2024 EKG Time: 1752 Rate: 92 Rhythm: normal sinus rhythm QRS Axis: normal Intervals: normal ST/T Wave abnormalities: normal Narrative Interpretation: no evidence of acute ischemia RADIOLOGY ED MD interpretation: Head CT without contrast shows  focal hypodensity and loss of gray-white matter differentiation in the left frontal lobe consistent with acute or subacute infarct - All radiology independently interpreted and agree with radiology assessment Official radiology report(s): MR BRAIN WO CONTRAST Result Date: 05/11/2024 EXAM: MRI BRAIN WITHOUT CONTRAST 05/11/2024 10:00:06 PM TECHNIQUE: Multiplanar multisequence MRI of the head/brain was performed without the administration of intravenous contrast. COMPARISON: 06/02/2016 CLINICAL HISTORY: Stroke, follow up. PT comes in via pov with with complaints of confusion and difficulty forming words. Pt's last known well was 3 days ago. Pt with left sided weakness and left sided facial droop, and reports slurred speech, and staggering 3 days ago. Pt with no complaints of pain at this time. Pt is  alert and oriented x2. FINDINGS: BRAIN AND VENTRICLES: Intermediate sized acute infarct of the left frontal lobe. Punctate foci of acute ischemia within both occipital lobes. Multifocal hyperintense T2-weighted signal within the cerebral white matter, most commonly due to chronic small vessel disease. Mild generalized volume loss. No intracranial hemorrhage. No mass. No midline shift. No hydrocephalus. The sella is unremarkable. Normal flow voids. ORBITS: No acute abnormality. SINUSES AND MASTOIDS: No acute abnormality. BONES AND SOFT TISSUES: Normal marrow signal. No acute soft tissue abnormality. IMPRESSION: 1. Intermediate sized acute infarct of the left frontal lobe and punctate foci of acute ischemia within both occipital lobes. 2. Multifocal hyperintense T2-weighted signal within the cerebral white matter, most commonly due to chronic small vessel disease. 3. Mild generalized volume loss. Electronically signed by: Franky Stanford MD 05/11/2024 10:05 PM EDT RP Workstation: [id number]   DG Abd Portable 1V Result Date: 05/11/2024 CLINICAL DATA:  Stroke EXAM: PORTABLE ABDOMEN - 1 VIEW COMPARISON:  CT 03/29/2013 FINDINGS: Contrast within the renal collecting systems and bladder. Nonobstructed bowel-gas pattern. No unexpected metallic foreign object is seen. IMPRESSION: Nonobstructed bowel-gas pattern. Negative for unexpected metallic foreign object over the included abdomen and pelvis. Electronically Signed   By: Luke Bun M.D.   On: 05/11/2024 21:47   DG Chest Port 1 View Result Date: 05/11/2024 CLINICAL DATA:  Stroke follow-up EXAM: PORTABLE CHEST 1 VIEW COMPARISON:  07/30/2021 FINDINGS: No acute airspace disease or effusion. Normal cardiomediastinal silhouette with aortic atherosclerosis. Coronary stents are noted. Metallic snaps over the supraclavicular soft tissues. Aortic atherosclerosis. IMPRESSION: No active disease. Chronic coronary stents. No unexpected metallic density foreign bodies. Electronically  Signed   By: Luke Bun M.D.   On: 05/11/2024 21:45   CT ANGIO HEAD NECK W WO CM Result Date: 05/11/2024 EXAM: CTA HEAD AND NECK WITH AND WITHOUT 05/11/2024 08:25:59 PM TECHNIQUE: CTA of the head and neck was performed with and without the administration of intravenous contrast. Multiplanar 2D and/or 3D reformatted images are provided for review. Automated exposure control, iterative reconstruction, and/or weight based adjustment of the mA/kV was utilized to reduce the radiation dose to as low as reasonably achievable. Stenosis of the internal carotid arteries measured using NASCET criteria. COMPARISON: None available CLINICAL HISTORY: Stroke/TIA, determine embolic source. PT comes in via pov with complaints of confusion and difficulty forming words. Pt's last known well was 3 days ago. Pt with left sided weakness and left sided facial droop, and reports slurred speech, and staggering 3 days ago. Pt with no complaints of pain at this time. Pt is alert and oriented x2. FINDINGS: CTA NECK: AORTIC ARCH AND ARCH VESSELS: Calcific aortic atherosclerosis. CERVICAL CAROTID ARTERIES: Mixed density atherosclerosis at the right carotid bifurcation extending into the ICA without hemodynamically significant stenosis. Moderate calcific atherosclerosis of the left carotid  bifurcation with multifocal atherosclerotic calcifications in the ICA but no hemodynamically significant stenosis. CERVICAL VERTEBRAL ARTERIES: Mild atherosclerotic calcification of both vertebral artery V4 segments without significant stenosis. LUNGS AND MEDIASTINUM: Unremarkable. SOFT TISSUES: No acute abnormality. BONES: No acute abnormality. 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 There is mild atherosclerotic calcification of both ICA cavernous segments without significant stenosis. No stenosis of the middle or anterior cerebral arteries. POSTERIOR CIRCULATION: No  significant stenosis of the posterior cerebral arteries. No significant stenosis of the basilar artery. No significant stenosis of the vertebral arteries. No aneurysm. OTHER: No dural venous sinus thrombosis on this non-dedicated study. IMPRESSION: 1. No large vessel occlusion or hemodynamically significant stenosis in the head or neck. 2. 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 Electronically signed by: Franky Stanford MD 05/11/2024 08:55 PM EDT RP Workstation: [id number]   CT HEAD WO CONTRAST Result Date: 05/11/2024 CLINICAL DATA:  Facial droop confusion EXAM: CT HEAD WITHOUT CONTRAST TECHNIQUE: Contiguous axial images were obtained from the base of the skull through the vertex without intravenous contrast. RADIATION DOSE REDUCTION: This exam was performed according to the departmental dose-optimization program which includes automated exposure control, adjustment of the mA and/or kV according to patient size and/or use of iterative reconstruction technique. COMPARISON:  CT brain 01/28/2019, MRI 06/02/2016 FINDINGS: Brain: Focal hypodensity and loss of gray-white matter differentiation in the left frontal lobe consistent with acute or subacute infarct. No hemorrhage. No significant mass effect or midline shift. Atrophy and chronic small vessel ischemic changes of the white matter. Slight interval ventricular enlargement compared to prior head CT, likely due to atrophy progression. Vascular: No hyperdense vessels.  No unexpected calcification Skull: Normal. Negative for fracture or focal lesion. Sinuses/Orbits: No acute finding. Other: None IMPRESSION: 1. Focal hypodensity and loss of gray-white matter differentiation in the left frontal lobe consistent with acute or subacute infarct. No hemorrhage. No midline shift or significant mass effect 2. Atrophy and chronic small vessel ischemic changes of the white matter. Electronically Signed   By: Luke Bun  M.D.   On: 05/11/2024 18:50   PROCEDURES: Critical Care performed: Yes, see critical care procedure note(s) .1-3 Lead EKG Interpretation  Performed by: Jossie Artist POUR, MD Authorized by: Jossie Artist POUR, MD     Interpretation: normal     ECG rate:  71   ECG rate assessment: normal     Rhythm: sinus rhythm     Ectopy: none     Conduction: normal   CRITICAL CARE Performed by: Kijana Estock K Vince Ainsley  Total critical care time: 33 minutes  Critical care time was exclusive of separately billable procedures and treating other patients.  Critical care was necessary to treat or prevent imminent or life-threatening deterioration.  Critical care was time spent personally by me on the following activities: development of treatment plan with patient and/or surrogate as well as nursing, discussions with consultants, evaluation of patient's response to treatment, examination of patient, obtaining history from patient or surrogate, ordering and performing treatments and interventions, ordering and review of laboratory studies, ordering and review of radiographic studies, pulse oximetry and re-evaluation of patient's condition.  MEDICATIONS ORDERED IN ED: Medications   stroke: early stages of recovery book (has no administration in time range)  0.9 %  sodium chloride  infusion ( Intravenous New Bag/Given 05/11/24 2135)  acetaminophen  (TYLENOL ) tablet 650 mg (has no administration in time range)  Or  acetaminophen  (TYLENOL ) 160 MG/5ML solution 650 mg (has no administration in time range)    Or  acetaminophen  (TYLENOL ) suppository 650 mg (has no administration in time range)  senna-docusate (Senokot-S) tablet 1 tablet (has no administration in time range)  enoxaparin  (LOVENOX ) injection 40 mg (40 mg Subcutaneous Given 05/11/24 2136)  aspirin  EC tablet 81 mg (has no administration in time range)  nitroGLYCERIN  (NITROSTAT ) SL tablet 0.4 mg (has no administration in time range)  isosorbide  mononitrate (IMDUR )  24 hr tablet 30 mg (has no administration in time range)  atorvastatin  (LIPITOR ) tablet 80 mg (has no administration in time range)  clopidogrel  (PLAVIX ) tablet 75 mg (75 mg Oral Given 05/11/24 2213)  Oral care mouth rinse (has no administration in time range)  metoprolol  succinate (TOPROL -XL) 24 hr tablet 25 mg (has no administration in time range)  losartan  (COZAAR ) tablet 25 mg (has no administration in time range)  sodium chloride  flush (NS) 0.9 % injection 3 mL (3 mLs Intravenous Given 05/11/24 1818)  iohexol  (OMNIPAQUE ) 350 MG/ML injection 75 mL (75 mLs Intravenous Contrast Given 05/11/24 2025)   IMPRESSION / MDM / ASSESSMENT AND PLAN / ED COURSE  I reviewed the triage vital signs and the nursing notes.                             The patient is on the cardiac monitor to evaluate for evidence of arrhythmia and/or significant heart rate changes. Patient's presentation is most consistent with acute presentation with potential threat to life or bodily function. Acute CVA PMH risk factors: Hypertension, hyperlipidemia Neurologic Deficits: Left-sided facial droop, slurred speech, left upper extremity weakness Last known Well Time: 3 days prior to arrival Given History and Exam I have lower suspicion for infectious etiology, neurologic changes secondary to toxicologic ingestion, seizure, complex migraine. Presentation concerning for possible stroke requiring workup.  Workup: Labs: POC glucose, CBC, BMP, LFTs, Troponin, PT/INR, PTT, Type and Screen Other Diagnostics: ECG, CXR, non-contrast head CT followed by CTA brain and neck (to r/o large vessel occlusion amenable to thrombectomy) Interventions: Patient low on NIH scale and out of the window for tpa.  Consult: Neurology. Discussed with Dr. Matthews regarding patient's neurological symptoms and last well-known time and eligibility for TPA criteria.  Patient is well outside the window for tPA Disposition: Admission to medicine   FINAL CLINICAL  IMPRESSION(S) / ED DIAGNOSES   Final diagnoses:  Acute CVA (cerebrovascular accident) (HCC)  Facial droop  Slurred speech  Left arm weakness   Rx / DC Orders   ED Discharge Orders     None      Note:  This document was prepared using Dragon voice recognition software and may include unintentional dictation errors.   Tabita Corbo K, MD 05/11/24 [phone]

## 2024-05-11 NOTE — H&P (Signed)
 History and Physical    Patient: Sylvia Wiggins FMW:[id number] DOB: Aug 21, 1961 DOA: 05/11/2024 DOS: the patient was seen and examined on 05/11/2024 PCP: Patient, [name]  Patient coming from: Home  Chief Complaint:  Chief Complaint  Patient presents with   Altered Mental Status   HPI: Sylvia Wiggins is a 63 y.o. female with medical history significant of CAD s/p NSTEMI, HTN, HLD, depression, chronic back pain who presented to the ED for evaluation of confusion, difficulty speaking, left upper extremity weakness.  Symptoms reportedly started over 3 days ago.  Pt herself tells me she feels normal, but having obvious word-finding difficulty.  Her friend present at bedside reports pt had difficulty finding words, slurred speech, confusion, staggering gait, with left facial droop and left upper extremity weakness. Pt denies other recent illnesses including fever/chills, cough, sore throat, congestion, nausea/vomiting, diarrhea, abdominal pain, dysuria or other complaints.  She denies noticing heart racing or palpitations any time recently.  No chest pain or tightness.  No dizziness or lightheadedness.    ED course - initial vitals temp 97.5 F, HR 100 >> 74 bpm, RR 15, initial BP 141/102, spO2 100% on room air. Labs obtained including CMP, CBC were notable for bicarb 21, non-fasting glucose 102, Hbg concentrated at 16.9, INR 1.0, EtOH < 15.   EKG normal sinus rhythm 92 bpm, no ST elevation or other acute ischemic changes.  CT head w/o contrast shows a left frontal lobe acute vs subacute infarct, as well as atrophy and chronic small vessel ischemic changes.   Review of Systems: As mentioned in the history of present illness. All other systems reviewed and are negative.   Past Medical History:  Diagnosis Date   Abscess, gluteal, left    Recurrent drainage, followed by [facility]   Arthritis    CAD (coronary artery disease)    Stents in Clogged artery at [facility] June 2014   Depression    Elevated  lipids    Hyperlipidemia    Hypertension    Myocardial infarction [facility]) June 2014   Past Surgical History:  Procedure Laterality Date   CARDIAC CATHETERIZATION N/A 08/30/2016   Procedure: Left Heart Cath and Coronary Angiography;  Surgeon: Marsa Dooms, MD;  Location: [facility] INVASIVE CV LAB;  Service: Cardiovascular;  Laterality: N/A;   CARDIAC SURGERY  03/30/2013   stent placement   CORONARY ANGIOPLASTY     cardiac stents x 2   CORONARY STENT INTERVENTION N/A 08/23/2020   Procedure: CORONARY STENT INTERVENTION;  Surgeon: Florencio Cara BIRCH, MD;  Location: [facility] INVASIVE CV LAB;  Service: Cardiovascular;  Laterality: N/A;   CORONARY/GRAFT ACUTE MI REVASCULARIZATION N/A 07/30/2021   Procedure: Coronary/Graft Acute MI Revascularization;  Surgeon: Lawyer Bernardino Cough, MD;  Location: [facility] INVASIVE CV LAB;  Service: Cardiovascular;  Laterality: N/A;   INGUINAL HERNIA REPAIR Right 02/26/2020   Procedure: HERNIA REPAIR INGUINAL ADULT;  Surgeon: Tye Millet, DO;  Location: [facility] ORS;  Service: General;  Laterality: Right;   LEFT HEART CATH AND CORONARY ANGIOGRAPHY N/A 08/23/2020   Procedure: LEFT HEART CATH AND CORONARY ANGIOGRAPHY and possible PCI and stent;  Surgeon: Florencio Cara BIRCH, MD;  Location: [facility] INVASIVE CV LAB;  Service: Cardiovascular;  Laterality: N/A;   LEFT HEART CATH AND CORONARY ANGIOGRAPHY N/A 07/30/2021   Procedure: LEFT HEART CATH AND CORONARY ANGIOGRAPHY;  Surgeon: Lawyer Bernardino Cough, MD;  Location: [facility] INVASIVE CV LAB;  Service: Cardiovascular;  Laterality: N/A;   LEFT HEART CATH AND CORS/GRAFTS ANGIOGRAPHY N/A 02/18/2018   Procedure:  LEFT HEART CATH AND CORS/GRAFTS ANGIOGRAPHY;  Surgeon: Bosie Vinie LABOR, MD;  Location: [facility] INVASIVE CV LAB;  Service: Cardiovascular;  Laterality: N/A;   RECTAL SURGERY  Anal Fissure   XI ROBOTIC ASSISTED INGUINAL HERNIA REPAIR WITH MESH Right 05/16/2019   Procedure: XI ROBOTIC ASSISTED Right INGUINAL HERNIA REPAIR WITH MESH;  Surgeon:  Tye Millet, DO;  Location: [facility] ORS;  Service: General;  Laterality: Right;   Social History:  reports that she has been smoking cigarettes. She has never used smokeless tobacco. She reports that she does not drink alcohol and does not use drugs.  Allergies  Allergen Reactions   Other Other (See Comments)    Pt states the gas used to put her to sleep made her have a bad headache, out of it for 3 days, made patient easy to anger   Sulfamethoxazole-Trimethoprim Nausea Only   Penicillins Nausea Only    Did it involve swelling of the face/tongue/throat, SOB, or low BP? No Did it involve sudden or severe rash/hives, skin peeling, or any reaction on the inside of your mouth or nose? No Did you need to seek medical attention at a hospital or doctor's office? No When did it last happen?      1990 If all above answers are NO, may proceed with cephalosporin use.     Family History  Problem Relation Age of Onset   Cancer Mother        colon   Heart disease Father        Died of Heart Attack    Prior to Admission medications   Medication Sig Start Date End Date Taking? Authorizing Provider  aspirin  EC 81 MG EC tablet Take 1 tablet (81 mg total) by mouth daily. Swallow whole. 08/25/20   Ezenduka, Nkeiruka J, MD  atorvastatin  (LIPITOR ) 80 MG tablet Take 1 tablet (80 mg total) by mouth daily. 08/01/21 08/31/21  Patel, Sona, MD  gabapentin  (NEURONTIN ) 300 MG capsule Take 300 mg by mouth 3 (three) times daily. 09/23/20   [provider]  isosorbide  mononitrate (IMDUR ) 30 MG 24 hr tablet Take 1 tablet (30 mg total) by mouth daily. 08/02/21   Patel, Sona, MD  losartan  (COZAAR ) 25 MG tablet Take 1 tablet (25 mg total) by mouth daily. 08/01/21 08/31/21  Patel, Sona, MD  metoprolol  succinate (TOPROL -XL) 25 MG 24 hr tablet Take 1 tablet (25 mg total) by mouth daily with breakfast. Take with or immediately following a meal. 08/02/21   Tobie Calix, MD  nitroGLYCERIN  (NITROSTAT ) 0.4 MG SL  tablet Place 1 tablet (0.4 mg total) under the tongue every 5 (five) minutes x 3 doses as needed for chest pain. 08/01/21 08/31/21  Tobie Calix, MD    Physical Exam: Vitals:   05/11/24 1751 05/11/24 1752 05/11/24 1939  BP: (!) 141/102  (!) 144/83  Pulse: 100  74  Resp: 17  14  Temp: (!) 97.5 F (36.4 C)    SpO2: 100%  100%  Weight:  66.1 kg   Height:  5' 2 (1.575 m)    General exam: awake, alert, no acute distress HEENT: atraumatic, clear conjunctiva, anicteric sclera, moist mucus membranes, hearing grossly normal  Respiratory system: CTAB, no wheezes, rales or rhonchi, normal respiratory effort. Cardiovascular system: normal S1/S2, RRR, no JVD, murmurs, rubs, gallops, no pedal edema.   Gastrointestinal system: soft, NT, ND, no HSM felt, +bowel sounds. Central nervous system: A&O x3. no gross focal neurologic deficits, normal speech but apparent word-finding difficulty Extremities: moves all, no  edema, normal tone Skin: dry, intact, normal temperature, normal color, No rashes, lesions or ulcers seen on visualized skin Psychiatry: normal mood, congruent affect, judgement and insight appear normal    Data Reviewed:  As reviewed above  Assessment and Plan:    Acute ischemic stroke - left frontal lobe on CT head Last known well > 3 days ago --MRI brain --CTA head & neck --Neurology to see tomorrow AM --No need for permissive HTN over 3 days from symptom onset --Echo --Telemetry --Continue home ASA  --Start Plavix  --Check Hb A1c and lipid panel --PT/OT/SLP evaluations --Diet okay if bedside swallow screen passed  CAD - stable, no chest pain, EKG non-acute Hx of NSTEMI  HTN HLD --Continue home ASA, Lipitor , metoprolol , Imdur , losartan  --PRN SL  nitroglycerin      Advance Care Planning: Code status - full code  Consults: Per EDP, Neurology is aware of patient, Dr. Matthews  Family Communication: Friend present at bedside  Severity of Illness: The appropriate  patient status for this patient is OBSERVATION. Observation status is judged to be reasonable and necessary in order to provide the required intensity of service to ensure the patient's safety. The patient's presenting symptoms, physical exam findings, and initial radiographic and laboratory data in the context of their medical condition is felt to place them at decreased risk for further clinical deterioration. Furthermore, it is anticipated that the patient will be medically stable for discharge from the hospital within 2 midnights of admission.   Author: Burnard DELENA Cunning, DO 05/11/2024 7:58 PM  For on call review [email or web address].

## 2024-05-11 NOTE — ED Triage Notes (Signed)
 PT comes in via pov with with complaints of confusion and difficulty forming words. Pt's last known well was 3 days ago. Pt with left sided weakness and left sided facial droop, and reports slurred speech, and staggering 3 days ago. Pt with no complaints of pain at this time. Pt is alert and oriented x2.

## 2024-05-12 ENCOUNTER — Observation Stay
Admit: 2024-05-12 | Discharge: 2024-05-12 | Disposition: A | Discharge status: Home / Self Care | Attending: Internal Medicine

## 2024-05-12 DIAGNOSIS — I639 Cerebral infarction, unspecified: Secondary | ICD-10-CM | POA: Diagnosis not present

## 2024-05-12 DIAGNOSIS — I6340 Cerebral infarction due to embolism of unspecified cerebral artery: Secondary | ICD-10-CM | POA: Diagnosis not present

## 2024-05-12 LAB — CBC WITH DIFFERENTIAL/PLATELET
Abs Immature Granulocytes: 0.02 K/uL (ref 0.00–0.07)
Basophils Absolute: 0.1 K/uL (ref 0.0–0.1)
Basophils Relative: 1 %
Eosinophils Absolute: 0.1 K/uL (ref 0.0–0.5)
Eosinophils Relative: 2 %
HCT: 41.1 % (ref 36.0–46.0)
Hemoglobin: 14.5 g/dL (ref 12.0–15.0)
Immature Granulocytes: 0 %
Lymphocytes Relative: 20 %
Lymphs Abs: 1.6 K/uL (ref 0.7–4.0)
MCH: 34.8 pg — ABNORMAL HIGH (ref 26.0–34.0)
MCHC: 35.3 g/dL (ref 30.0–36.0)
MCV: 98.6 fL (ref 80.0–100.0)
Monocytes Absolute: 0.6 K/uL (ref 0.1–1.0)
Monocytes Relative: 7 %
Neutro Abs: 5.5 K/uL (ref 1.7–7.7)
Neutrophils Relative %: 70 %
Platelets: 245 K/uL (ref 150–400)
RBC: 4.17 MIL/uL (ref 3.87–5.11)
RDW: 12.4 % (ref 11.5–15.5)
WBC: 7.8 K/uL (ref 4.0–10.5)
nRBC: 0 % (ref 0.0–0.2)

## 2024-05-12 LAB — URINALYSIS, COMPLETE (UACMP) WITH MICROSCOPIC
Bilirubin Urine: NEGATIVE
Glucose, UA: NEGATIVE mg/dL
Hgb urine dipstick: NEGATIVE
Ketones, ur: NEGATIVE mg/dL
Nitrite: NEGATIVE
Protein, ur: NEGATIVE mg/dL
Specific Gravity, Urine: 1.026 (ref 1.005–1.030)
pH: 6 (ref 5.0–8.0)

## 2024-05-12 LAB — LIPID PANEL
Cholesterol: 309 mg/dL — ABNORMAL HIGH (ref 0–200)
HDL: 37 mg/dL — ABNORMAL LOW (ref >40)
LDL Cholesterol: 211 mg/dL — ABNORMAL HIGH (ref 0–99)
Total CHOL/HDL Ratio: 8.4 ratio
Triglycerides: 307 mg/dL — ABNORMAL HIGH (ref <150)
VLDL: 61 mg/dL — ABNORMAL HIGH (ref 0–40)

## 2024-05-12 LAB — ECHOCARDIOGRAM COMPLETE
AV Mean grad: 3 mmHg
AV Peak grad: 4.8 mmHg
Ao pk vel: 1.1 m/s
Area-P 1/2: 3.79 cm2
Height: 62 in
S' Lateral: 3.4 cm
Weight: 2186.96 [oz_av]

## 2024-05-12 LAB — HEMOGLOBIN A1C
Hgb A1c MFr Bld: 5.3 % (ref 4.8–5.6)
Mean Plasma Glucose: 105 mg/dL

## 2024-05-12 LAB — HIV ANTIBODY (ROUTINE TESTING W REFLEX): HIV Screen 4th Generation wRfx: NONREACTIVE

## 2024-05-12 NOTE — Progress Notes (Signed)
 Progress Note   Patient: Sylvia Wiggins FMW:[id number] DOB: Sep 11, 1961 DOA: 05/11/2024     0 DOS: the patient was seen and examined on 05/12/2024   Brief hospital course: Sylvia Wiggins is a 63 y.o. female with medical history significant of CAD s/p NSTEMI, HTN, HLD, depression, chronic back pain who presented to the ED for evaluation of confusion, difficulty speaking, left upper extremity weakness.  Symptoms reportedly started over 3 days ago.  Pt herself tells me she feels normal, but having obvious word-finding difficulty.  Her friend present at bedside reports pt had difficulty finding words, slurred speech, confusion, staggering gait, with left facial droop and left upper extremity weakness. Pt denies other recent illnesses including fever/chills, cough, sore throat, congestion, nausea/vomiting, diarrhea, abdominal pain, dysuria or other complaints.  She denies noticing heart racing or palpitations any time recently.  No chest pain or tightness.  No dizziness or lightheadedness.     ED course - initial vitals temp 97.5 F, HR 100 >> 74 bpm, RR 15, initial BP 141/102, spO2 100% on room air. Labs obtained including CMP, CBC were notable for bicarb 21, non-fasting glucose 102, Hbg concentrated at 16.9, INR 1.0, EtOH < 15.   EKG normal sinus rhythm 92 bpm, no ST elevation or other acute ischemic changes.   CT head w/o contrast shows a left frontal lobe acute vs subacute infarct, as well as atrophy and chronic small vessel ischemic changes.  Assessment and Plan:  Acute ischemic stroke - left frontal lobe on CT head Last known well > 3 days ago Intermediate sized acute infarct of the left frontal lobe and punctate foci of acute ischemia within both occipital lobes Patient has been seen by neurologist and case discussed Echocardiogram showed normal EF with grade 1 diastolic dysfunction Continue aspirin  and statin therapy as well as Plavix  Continue PT OT  Acute metabolic encephalopathy Follow-up on  urinalysis Monitor neurostatus closely Repeat CBC   CAD - stable, no chest pain, EKG non-acute HTN HLD Continue home ASA, Lipitor , metoprolol , Imdur , losartan  Continue atorvastatin  --PRN SL  nitroglycerin     Advance Care Planning: Code status - full code   Consults: Per EDP, Neurology is aware of patient, Dr. Matthews   Family Communication: Friend present at bedside    Subjective:  Patient seen and examined at bedside this morning Confused compared to baseline Denies chest pain nausea vomiting abdominal pain  Physical Exam: General exam: Age female laying in bed in no acute distress HEENT: atraumatic, clear conjunctiva, anicteric sclera, moist mucus membranes, hearing grossly normal  Respiratory system: CTAB, no wheezes, rales or rhonchi, normal respiratory effort. Cardiovascular system: normal S1/S2, RRR, no JVD, murmurs, rubs, gallops, no pedal edema.   Gastrointestinal system: soft, NT, ND, no HSM felt, +bowel sounds. Central nervous system: Alert but confused oriented x 1 and disoriented to place and time Extremities: moves all, no edema, normal tone Skin: dry, intact, normal temperature, normal color, No rashes, lesions or ulcers seen on visualized skin     Vitals:   05/12/24 0018 05/12/24 0405 05/12/24 0904 05/12/24 1541  BP: 121/71 137/83 115/76 94/73  Pulse: 63 68 70 71  Resp: 12 16    Temp: 98.3 F (36.8 C) 98.3 F (36.8 C) (!) 97.4 F (36.3 C) 98.1 F (36.7 C)  TempSrc: Oral Oral    SpO2: 100% 100% 100% 100%  Weight:      Height:        Data Reviewed: CTA head and neck did not show  any evidence of large vessel occlusion    Latest Ref Rng & Units 05/11/2024    5:54 PM 07/31/2021    5:19 AM 07/30/2021    7:22 PM  CBC  WBC 4.0 - 10.5 K/uL 7.8  12.0  15.4   Hemoglobin 12.0 - 15.0 g/dL 83.0  83.4  83.8   Hematocrit 36.0 - 46.0 % 48.5  47.7  46.5   Platelets 150 - 400 K/uL 258  253  287        Latest Ref Rng & Units 05/11/2024    5:54 PM 08/01/2021     5:38 AM 07/31/2021    5:19 AM  BMP  Glucose 70 - 99 mg/dL 897  875  830   BUN 8 - 23 mg/dL 14  13  10    Creatinine 0.44 - 1.00 mg/dL 9.10  9.24  9.31   Sodium 135 - 145 mmol/L 137  136  141   Potassium 3.5 - 5.1 mmol/L 3.6  3.6  4.1   Chloride 98 - 111 mmol/L 102  102  103   CO2 22 - 32 mmol/L 21  27  27    Calcium  8.9 - 10.3 mg/dL 89.9  8.9  9.3       Family Communication: None present at bedside  Disposition: Possibly home when medically stable  Time spent: 54 minutes  Author: Drue ONEIDA Potter, MD 05/12/2024 4:26 PM  For on call review [email or web address].

## 2024-05-12 NOTE — TOC Initial Note (Signed)
 Transition of Care [facility]) - Initial/Assessment Note    Patient Details  Name: Sylvia Wiggins MRN: [id number] Date of Birth: 10-31-1960  Transition [facility]) CM/SW Contact:    Dalia GORMAN Fuse, RN Phone Number: 05/12/2024, 9:50 AM  Clinical Narrative:                  Patient admitted for a stroke w/u. Neuro consulted. TOC placed a list of PCPs on the patient's AVS. No other TOC needs, please outreach if additional TOC needs identified.        Patient Goals and CMS Choice            Expected Discharge Plan and Services                                              Prior Living Arrangements/Services                       Activities of Daily Living   ADL Screening (condition at time of admission) Independently performs ADLs?: Yes (appropriate for developmental age) Is the patient deaf or have difficulty hearing?: No Does the patient have difficulty seeing, even when wearing glasses/contacts?: No Does the patient have difficulty concentrating, remembering, or making decisions?: No  Permission Sought/Granted                  Emotional Assessment              Admission diagnosis:  Acute ischemic stroke [facility]) [I63.9] Patient Active Problem List   Diagnosis Date Noted   Acute ischemic stroke (HCC) 05/11/2024   STEMI (ST elevation myocardial infarction) (HCC) 07/30/2021   Unstable angina (HCC) 08/21/2020   Nicotine  dependence 08/21/2020   HTN (hypertension) 02/16/2018   Abnormal EKG 02/16/2018   Chest pain 02/16/2018   Chest pain, rule out acute myocardial infarction 08/30/2016   NSTEMI (non-ST elevated myocardial infarction) (HCC) 08/30/2016   CAD (coronary artery disease) 01/26/2016   Hypertension 01/26/2016   Depression 01/26/2016   Hyperlipidemia 01/26/2016   Tremor of both hands 01/26/2016   Chronic right-sided low back pain with right-sided sciatica 01/26/2016   Major depression 10/05/2015   PCP:  Patient, No Pcp  Per Pharmacy:   [facility] - [place], [place] - [address] [place] [address] Phone: [phone] Fax: [phone]  [facility] - [facility] [address] [place] [address] Phone: [phone] Fax: [phone]     Social Drivers of Health (SDOH) Social History: SDOH Screenings   Food Insecurity: No Food Insecurity (05/11/2024)  Housing: Low Risk  (05/11/2024)  Transportation Needs: No Transportation Needs (05/11/2024)  Utilities: Not At Risk (05/11/2024)  Tobacco Use: High Risk (05/11/2024)   SDOH Interventions:     Readmission Risk Interventions     No data to display

## 2024-05-12 NOTE — Progress Notes (Signed)
 Physical Therapy Evaluation Patient Details Name: Sylvia Wiggins MRN: [id number] DOB: 01/09/1961 Today's Date: 05/12/2024  History of Present Illness  Pt is a 63 y/o female admitted secondary to L sided weakness, word finding difficulties and AMS. MRI of the brain revealed an intermediate sized acute infarct of the left frontal lobe and punctate foci  of acute ischemia within both occipital lobes. PMH including but not limited to CAD s/p NSTEMI, HTN, HLD, depression, chronic back pain.   Clinical Impression  Pt presented supine in bed with HOB elevated, awake and willing to participate in therapy session. Prior to admission, pt reported that she was independent with all functional mobility and ADLs. Pt reported that she lives with someone but they are not a family member and pt emphatically stating several times I keep to myself. Pt lives in a single level home with 5-6 steps to enter (with rail). At the time of evaluation, pt able to complete bed mobility independently, transfers at a mod Ind level and ambulated in the hallway with CGA without use of an AD. Pt was also able to participate in a higher level balance assessment and scored a 22/24 on the DGI, indicating that she is a safe community ambulator. Pt would continue to benefit from skilled physical therapy services at this time while admitted and after d/c to address the below listed limitations in order to improve overall safety and independence with functional mobility.       If plan is discharge home, recommend the following: Assist for transportation;Help with stairs or ramp for entrance   Can travel by private vehicle        Equipment Recommendations None recommended by PT  Recommendations for Other Services       Functional Status Assessment Patient has had a recent decline in their functional status and demonstrates the ability to make significant improvements in function in a reasonable and predictable amount of time.      Precautions / Restrictions Precautions Precautions: Fall Recall of Precautions/Restrictions: Intact Restrictions Weight Bearing Restrictions Per Provider Order: No      Mobility  Bed Mobility Overal bed mobility: Independent                  Transfers Overall transfer level: Modified independent Equipment used: None                    Ambulation/Gait Ambulation/Gait assistance: Contact guard assist Gait Distance (Feet): 200 Feet Assistive device: None Gait Pattern/deviations: Step-through pattern, Decreased stride length Gait velocity: able to fluctuate     General Gait Details: pt steady with normal gait without use of an AD. she was able to participate in higher level balance assessment with gait without difficulties  Stairs            Wheelchair Mobility     Tilt Bed    Modified Rankin (Stroke Patients Only) Modified Rankin (Stroke Patients Only) Pre-Morbid Rankin Score: No symptoms Modified Rankin: Slight disability     Balance Overall balance assessment: Needs assistance Sitting-balance support: Feet supported Sitting balance-Leahy Scale: Good     Standing balance support: During functional activity, No upper extremity supported Standing balance-Leahy Scale: Good                   Standardized Balance Assessment Standardized Balance Assessment : Dynamic Gait Index   Dynamic Gait Index Level Surface: Normal Change in Gait Speed: Normal Gait with Horizontal Head Turns: Mild Impairment Gait with Vertical Head  Turns: Normal Gait and Pivot Turn: Normal Step Over Obstacle: Normal Step Around Obstacles: Normal Steps: Mild Impairment Total Score: 22       Pertinent Vitals/Pain Pain Assessment Pain Assessment: No/denies pain    Home Living Family/patient expects to be discharged to:: Private residence Living Arrangements: Non-relatives/Friends   Type of Home: House Home Access: Stairs to enter Entrance  Stairs-Rails: Doctor, general practice of Steps: 5-6   Home Layout: One level Home Equipment: Shower seat - built in;Cane - single point;Crutches      Prior Function Prior Level of Function : Independent/Modified Independent;Driving             Mobility Comments: independent ADLs Comments: independent     Extremity/Trunk Assessment   Upper Extremity Assessment Upper Extremity Assessment: Defer to OT evaluation    Lower Extremity Assessment Lower Extremity Assessment: Overall WFL for tasks assessed       Communication   Communication Communication: Impaired Factors Affecting Communication: Difficulty expressing self    Cognition Arousal: Alert Behavior During Therapy: WFL for tasks assessed/performed   PT - Cognitive impairments: Difficult to assess Difficult to assess due to: Impaired communication                     PT - Cognition Comments: pt with word finding difficulties throughout, but cognition was [facility] for general conversation and she was able to follow multi-step commands but appeared to have some difficulties with motor planning Following commands: Intact       Cueing Cueing Techniques: Verbal cues, Gestural cues     General Comments      Exercises     Assessment/Plan    PT Assessment Patient needs continued PT services  PT Problem List Decreased balance;Decreased coordination;Decreased knowledge of precautions;Decreased knowledge of use of DME       PT Treatment Interventions DME instruction;Gait training;Stair training;Functional mobility training;Therapeutic activities;Therapeutic exercise;Balance training;Neuromuscular re-education;Patient/family education    PT Goals (Current goals can be found in the Care Plan section)  Acute Rehab PT Goals Patient Stated Goal: return home PT Goal Formulation: With patient Time For Goal Achievement: 05/26/24 Potential to Achieve Goals: Good    Frequency Min 2X/week      Co-evaluation               AM-PAC PT 6 Clicks Mobility  Outcome Measure Help needed turning from your back to your side while in a flat bed without using bedrails?: None Help needed moving from lying on your back to sitting on the side of a flat bed without using bedrails?: None Help needed moving to and from a bed to a chair (including a wheelchair)?: None Help needed standing up from a chair using your arms (e.g., wheelchair or bedside chair)?: None Help needed to walk in hospital room?: A Little Help needed climbing 3-5 steps with a railing? : A Little 6 Click Score: 22    End of Session Equipment Utilized During Treatment: Gait belt Activity Tolerance: Patient tolerated treatment well Patient left: in bed;with call bell/phone within reach;with bed alarm set Nurse Communication: Mobility status PT Visit Diagnosis: Other abnormalities of gait and mobility (R26.89)    Time: 9071-9057 PT Time Calculation (min) (ACUTE ONLY): 14 min   Charges:   PT Evaluation $PT Eval Low Complexity: 1 Low   PT General Charges $$ ACUTE PT VISIT: 1 Visit         Sylvia Wiggins, DPT  Acute Rehabilitation Services Office [phone]   Sylvia HERO  Zayne Marovich 05/12/2024, 10:24 AM

## 2024-05-12 NOTE — Evaluation (Signed)
 Speech Language Pathology Evaluation Patient Details Name: Sylvia Wiggins MRN: [id number] DOB: 1961-07-20 Today's Date: 05/12/2024 Time: 1110-1140 SLP Time Calculation (min) (ACUTE ONLY): 30 min  Problem List:  Patient Active Problem List   Diagnosis Date Noted   Acute ischemic stroke (HCC) 05/11/2024   STEMI (ST elevation myocardial infarction) (HCC) 07/30/2021   Unstable angina (HCC) 08/21/2020   Nicotine  dependence 08/21/2020   HTN (hypertension) 02/16/2018   Abnormal EKG 02/16/2018   Chest pain 02/16/2018   Chest pain, rule out acute myocardial infarction 08/30/2016   NSTEMI (non-ST elevated myocardial infarction) (HCC) 08/30/2016   CAD (coronary artery disease) 01/26/2016   Hypertension 01/26/2016   Depression 01/26/2016   Hyperlipidemia 01/26/2016   Tremor of both hands 01/26/2016   Chronic right-sided low back pain with right-sided sciatica 01/26/2016   Major depression 10/05/2015   Past Medical History:  Past Medical History:  Diagnosis Date   Abscess, gluteal, left    Recurrent drainage, [facility] by [facility].   Arthritis    CAD (coronary artery disease)    Stents in Clogged artery at [facility] June 2014   Depression    Elevated lipids    Hyperlipidemia    Hypertension    Myocardial infarction [facility]) June 2014   Past Surgical History:  Past Surgical History:  Procedure Laterality Date   CARDIAC CATHETERIZATION N/A 08/30/2016   Procedure: Left Heart Cath and Coronary Angiography;  Surgeon: Marsa Dooms, MD;  Location: [facility] INVASIVE CV LAB;  Service: Cardiovascular;  Laterality: N/A;   CARDIAC SURGERY  03/30/2013   stent placement   CORONARY ANGIOPLASTY     cardiac stents x 2   CORONARY STENT INTERVENTION N/A 08/23/2020   Procedure: CORONARY STENT INTERVENTION;  Surgeon: Florencio Cara BIRCH, MD;  Location: [facility] INVASIVE CV LAB;  Service: Cardiovascular;  Laterality: N/A;   CORONARY/GRAFT ACUTE MI REVASCULARIZATION N/A 07/30/2021   Procedure: Coronary/Graft Acute  MI Revascularization;  Surgeon: Lawyer Bernardino Cough, MD;  Location: [facility] INVASIVE CV LAB;  Service: Cardiovascular;  Laterality: N/A;   INGUINAL HERNIA REPAIR Right 02/26/2020   Procedure: HERNIA REPAIR INGUINAL ADULT;  Surgeon: Tye Millet, DO;  Location: [facility] ORS;  Service: General;  Laterality: Right;   LEFT HEART CATH AND CORONARY ANGIOGRAPHY N/A 08/23/2020   Procedure: LEFT HEART CATH AND CORONARY ANGIOGRAPHY and possible PCI and stent;  Surgeon: Florencio Cara BIRCH, MD;  Location: [facility] INVASIVE CV LAB;  Service: Cardiovascular;  Laterality: N/A;   LEFT HEART CATH AND CORONARY ANGIOGRAPHY N/A 07/30/2021   Procedure: LEFT HEART CATH AND CORONARY ANGIOGRAPHY;  Surgeon: Lawyer Bernardino Cough, MD;  Location: [facility] INVASIVE CV LAB;  Service: Cardiovascular;  Laterality: N/A;   LEFT HEART CATH AND CORS/GRAFTS ANGIOGRAPHY N/A 02/18/2018   Procedure: LEFT HEART CATH AND CORS/GRAFTS ANGIOGRAPHY;  Surgeon: Bosie Vinie LABOR, MD;  Location: [facility] INVASIVE CV LAB;  Service: Cardiovascular;  Laterality: N/A;   RECTAL SURGERY  Anal Fissure   XI ROBOTIC ASSISTED INGUINAL HERNIA REPAIR WITH MESH Right 05/16/2019   Procedure: XI ROBOTIC ASSISTED Right INGUINAL HERNIA REPAIR WITH MESH;  Surgeon: Tye Millet, DO;  Location: [facility] ORS;  Service: General;  Laterality: Right;   HPI:  Per H&P: Sylvia Wiggins is a 63 y.o. female with medical history significant of CAD s/p NSTEMI, HTN, HLD, depression, chronic back pain who presented to the ED for evaluation of confusion, difficulty speaking, left upper extremity weakness.  Symptoms reportedly started over 3 days ago.  Pt herself tells me she feels normal, but having obvious word-finding difficulty.  Her friend present at bedside reports pt had difficulty finding words, slurred speech, confusion, staggering gait, with left facial droop and left upper extremity weakness. MRI:  Intermediate sized acute infarct of the left frontal lobe and punctate foci  of acute ischemia within both  occipital lobes.  2. Multifocal hyperintense T2-weighted signal within the cerebral white matter,  most commonly due to chronic small vessel disease.  3. Mild generalized volume loss.   Assessment / Plan / Recommendation Clinical Impression  Pt seen for cognitive linguistic evaluation in the setting of acute CVA. Assessment consisting of pt interview and completion of the Mini Mental State Examination (MMSE). Score of 16/30 obtained on MMSE falling outside the criterion of normal limits. Pt presents with grossly moderate cognitive communication deficit, c/b reduced short term recall, complex attention, executive function, problem solving, expressive language, and orientation. Verbal cues (binary options and semantic cues) and repetition bolstered cognitive processing and anomia during confrontational naming. Further assessment indicated for reading/writing and complex expressive language. Independent visual processing demonstrated during assessment. Motor speech notable for mildly reduced articulation precision, related to mild left labial weakness/asymmetry. Education provided for use of compensatory strategies (slow rate and over articulation to aid precision. Pt with emerging awareness for current deficits. Pt reports baseline independence across iADLs. Therefore, recommend continued assessment to determine approximation to baseline and follow up recommendations. Pt likely to benefit from follow up SLP services at discharge. Pt in agreement with stated plan.      SLP Assessment  SLP Recommendation/Assessment: Patient needs continued Speech Language Pathology Services SLP Visit Diagnosis: Cognitive communication deficit (R41.841)     Assistance Recommended at Discharge  Intermittent Supervision/Assistance  Functional Status Assessment Patient has had a recent decline in their functional status and demonstrates the ability to make significant improvements in function in a reasonable and predictable  amount of time.  Frequency and Duration min 2x/week  2 weeks      SLP Evaluation Cognition  Overall Cognitive Status: Impaired/Different from baseline Arousal/Alertness: Awake/alert Orientation Level: Oriented to person;Oriented to place;Disoriented to time;Disoriented to situation (MMSE 5/10) Attention: Sustained Sustained Attention: Impaired Sustained Attention Impairment: Verbal basic Memory: Impaired Memory Impairment: Decreased short term memory Decreased Short Term Memory: Verbal basic (lexical recall 0/3) Awareness: Impaired Awareness Impairment: Intellectual impairment Problem Solving: Impaired Problem Solving Impairment: Verbal basic (numerical calculation 0/5) Executive Function: Organizing;Sequencing Sequencing: Impaired Sequencing Impairment: Verbal basic (clock drawing 0/4) Organizing: Impaired Organizing Impairment: Verbal basic Behaviors: Impulsive Safety/Judgment: Impaired (secondary to impulsivity)       Comprehension  Auditory Comprehension Overall Auditory Comprehension: Other (comment) (simple commands intact- 3/3 for command following- question higher level comprehension) Yes/No Questions: Within Functional Limits Commands: Within Functional Limits Conversation: Simple Interfering Components: Attention;Processing speed;Working Radio broadcast assistant: Repetition Counsellor: Within [employer] Reading Comprehension Reading Status:  (visual/verbal cues for simple comprehension)    Expression Expression Primary Mode of Expression: Verbal Verbal Expression Overall Verbal Expression: Impaired Initiation: No impairment Automatic Speech: Social Response Level of Generative/Spontaneous Verbalization: Conversation Repetition: No impairment Naming: Impairment Confrontation: Impaired Convergent: 50-74% accurate Verbal Errors: Perseveration;Aware of errors Pragmatics: No impairment Interfering Components:  Attention Effective Techniques: Semantic cues;Sentence completion Non-Verbal Means of Communication: Not applicable Written Expression Dominant Hand: Right Written Expression:  (intact at word level)   Oral / Motor  Oral Motor/Sensory Function Overall Oral Motor/Sensory Function: Mild impairment Facial ROM: Reduced left Facial Symmetry: Abnormal symmetry left Facial Strength: Reduced left Lingual ROM: Within Functional Limits Lingual Symmetry: Within Functional Limits Lingual Strength: Within  Functional Limits Velum: Within Functional Limits Mandible: Within Functional Limits Motor Speech Overall Motor Speech: Impaired Respiration: Within functional limits Phonation: Normal Resonance: Within functional limits Articulation: Impaired Level of Impairment: Conversation Intelligibility: Intelligibility reduced Word: 75-100% accurate Phrase: 75-100% accurate Sentence: 75-100% accurate Conversation: 75-100% accurate Motor Planning: Within functional limits Effective Techniques: Slow rate;Over-articulate           [place] Ilian Wessell Clapp, MS, CCC-SLP Speech Language Pathologist Rehab Services; [facility] [phone] (ascom)   [place] J Clapp 05/12/2024, 1:21 PM

## 2024-05-12 NOTE — Evaluation (Signed)
 Occupational Therapy Evaluation Patient Details Name: Sylvia Wiggins MRN: [id number] DOB: 1961-02-05 Today's Date: 05/12/2024   History of Present Illness   Pt is a 63 y.o. female who presented to the ED for evaluation of confusion, difficulty speaking, left upper extremity weakness for last 3-4 days. 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 PMH of CAD s/p NSTEMI, HTN, HLD, depression, chronic back pain     Clinical Impressions Pt was seen for OT evaluation this date. PTA, pt reports living with roommates and being IND with all tasks. She was driving, etc. Pt with most notable deficits in word finding. Found in hallway ambulating with RW and NT with handoff to OT. Pt ambulated back to her room and ambulated without RW with distant supervision. Able to stand to perform oral care and all aspects of toileting with Mod I/IND. Mild L hand weakness noted, but full sensation and coordination intact. She is alert and oriented. Able to name common items in room and read sentences on paper without difficulty. Pt left seated in recliner at end of session. Pt with no acute OT needs and no follow up OT recommended.      If plan is discharge home, recommend the following:   Direct supervision/assist for medications management;Direct supervision/assist for financial management     Functional Status Assessment   Patient has not had a recent decline in their functional status     Equipment Recommendations         Recommendations for Other Services         Precautions/Restrictions   Precautions Precautions: Fall Recall of Precautions/Restrictions: Intact Restrictions Weight Bearing Restrictions Per Provider Order: No     Mobility Bed Mobility Overal bed mobility: Independent                  Transfers Overall transfer level: Modified independent Equipment used: None               General  transfer comment: was found ambulating hallways with NT using RW, but no AD used within her room      Balance Overall balance assessment: Modified Independent Sitting-balance support: Feet supported Sitting balance-Leahy Scale: Normal     Standing balance support: During functional activity, No upper extremity supported Standing balance-Leahy Scale: Good                             ADL either performed or assessed with clinical judgement   ADL Overall ADL's : Independent;Modified independent                                       General ADL Comments: MOD I in room using RW in hallway with NT, then IND without AD to stand at sink to perform oral care and ambulate to the bathroom and back to recliner; MOd I with all aspects of toileting     Vision         Perception         Praxis         Pertinent Vitals/Pain Pain Assessment Pain Assessment: No/denies pain     Extremity/Trunk Assessment Upper Extremity Assessment Upper Extremity Assessment: Overall WFL for tasks assessed;Right hand dominant (reports very mild weakness to L hand)   Lower Extremity Assessment Lower Extremity Assessment: Overall  WFL for tasks assessed       Communication Communication Communication: Impaired Factors Affecting Communication: Difficulty expressing self   Cognition Arousal: Alert Behavior During Therapy: WFL for tasks assessed/performed                                 Following commands: Intact       Cueing  General Comments   Cueing Techniques: Verbal cues;Gestural cues      Exercises     Shoulder Instructions      Home Living Family/patient expects to be discharged to:: Private residence Living Arrangements: Non-relatives/Friends   Type of Home: House Home Access: Stairs to enter [employer] of Steps: 5-6; reports additional entry to the home that is level entry Entrance Stairs-Rails: Right;Left Home Layout:  One level     Bathroom Shower/Tub: Tub/shower unit (walk-in tub?)         Home Equipment: Shower seat - built in;Cane - single point;Crutches          Prior Functioning/Environment Prior Level of Function : Independent/Modified Independent;Driving             Mobility Comments: independent ADLs Comments: independent    OT Problem List:     OT Treatment/Interventions:        OT Goals(Current goals can be found in the care plan section)       OT Frequency:       Co-evaluation              AM-PAC OT 6 Clicks Daily Activity     Outcome Measure Help from another person eating meals?: None Help from another person taking care of personal grooming?: None Help from another person toileting, which includes using toliet, bedpan, or urinal?: None Help from another person bathing (including washing, rinsing, drying)?: None Help from another person to put on and taking off regular upper body clothing?: None Help from another person to put on and taking off regular lower body clothing?: None 6 Click Score: 24   End of Session Nurse Communication: Mobility status  Activity Tolerance: Patient tolerated treatment well Patient left: in chair;with call bell/phone within reach  OT Visit Diagnosis: Other abnormalities of gait and mobility (R26.89)                Time: 9045-8991 OT Time Calculation (min): 14 min Charges:  OT General Charges $OT Visit: 1 Visit OT Evaluation $OT Eval Low Complexity: 1 Low  Wojciech Willetts, OTR/L 05/12/24, 11:58 AM  Kahari Critzer E Mahogony Gilchrest 05/12/2024, 11:57 AM

## 2024-05-12 NOTE — Consult Note (Signed)
 NEUROLOGY CONSULT NOTE   Date of service: May 12, 2024 Patient Name: Sylvia Wiggins MRN:  [id number] DOB:  1961/04/10 Chief Complaint: Confusion, difficulty speaking and LUE weakness Requesting Provider: Dorinda Drue DASEN, MD  History of Present Illness  Sylvia Wiggins is a 63 y.o. female with PMHx of left gluteal abscess requiring recurrent drainage, arthritis, CAD s/p stenting, depression, hyperlipidemia and HTN who presented to the ED yesterday evening with confusion, difficulty speaking and LUE weakness. Her symptoms started over 3 days prior to presentation. She was noted to have obvious word-finding difficulty on EDP exam.  CT head revealed a left frontal lobe acute vs subacute infarct, as well as atrophy and chronic small vessel ischemic changes.   The patient is a poor historian with no informants at the bedside. Additional history from Hospitalist HPI has been reviewed:  Her friend present at bedside reports pt had difficulty finding words, slurred speech, confusion, staggering gait, with left facial droop and left upper extremity weakness. Pt denies other recent illnesses including fever/chills, cough, sore throat, congestion, nausea/vomiting, diarrhea, abdominal pain, dysuria or other complaints. She denies noticing heart racing or palpitations any time recently. No chest pain or tightness. No dizziness or lightheadedness.    ROS  Unable to ascertain due to cognitive impairment.   Past History   Past Medical History:  Diagnosis Date   Abscess, gluteal, left    Recurrent drainage, followed by [facility].   Arthritis    CAD (coronary artery disease)    Stents in Clogged artery at [facility] June 2014   Depression    Elevated lipids    Hyperlipidemia    Hypertension    Myocardial infarction [facility]) June 2014    Past Surgical History:  Procedure Laterality Date   CARDIAC CATHETERIZATION N/A 08/30/2016   Procedure: Left Heart Cath and Coronary Angiography;  Surgeon: Marsa Dooms,  MD;  Location: [facility] INVASIVE CV LAB;  Service: Cardiovascular;  Laterality: N/A;   CARDIAC SURGERY  03/30/2013   stent placement   CORONARY ANGIOPLASTY     cardiac stents x 2   CORONARY STENT INTERVENTION N/A 08/23/2020   Procedure: CORONARY STENT INTERVENTION;  Surgeon: Florencio Cara BIRCH, MD;  Location: [facility] INVASIVE CV LAB;  Service: Cardiovascular;  Laterality: N/A;   CORONARY/GRAFT ACUTE MI REVASCULARIZATION N/A 07/30/2021   Procedure: Coronary/Graft Acute MI Revascularization;  Surgeon: Lawyer Bernardino Cough, MD;  Location: [facility] INVASIVE CV LAB;  Service: Cardiovascular;  Laterality: N/A;   INGUINAL HERNIA REPAIR Right 02/26/2020   Procedure: HERNIA REPAIR INGUINAL ADULT;  Surgeon: Tye Millet, DO;  Location: [facility] ORS;  Service: General;  Laterality: Right;   LEFT HEART CATH AND CORONARY ANGIOGRAPHY N/A 08/23/2020   Procedure: LEFT HEART CATH AND CORONARY ANGIOGRAPHY and possible PCI and stent;  Surgeon: Florencio Cara BIRCH, MD;  Location: [facility] INVASIVE CV LAB;  Service: Cardiovascular;  Laterality: N/A;   LEFT HEART CATH AND CORONARY ANGIOGRAPHY N/A 07/30/2021   Procedure: LEFT HEART CATH AND CORONARY ANGIOGRAPHY;  Surgeon: Lawyer Bernardino Cough, MD;  Location: [facility] INVASIVE CV LAB;  Service: Cardiovascular;  Laterality: N/A;   LEFT HEART CATH AND CORS/GRAFTS ANGIOGRAPHY N/A 02/18/2018   Procedure: LEFT HEART CATH AND CORS/GRAFTS ANGIOGRAPHY;  Surgeon: Bosie Vinie LABOR, MD;  Location: [facility] INVASIVE CV LAB;  Service: Cardiovascular;  Laterality: N/A;   RECTAL SURGERY  Anal Fissure   XI ROBOTIC ASSISTED INGUINAL HERNIA REPAIR WITH MESH Right 05/16/2019   Procedure: XI ROBOTIC ASSISTED Right INGUINAL HERNIA REPAIR WITH MESH;  Surgeon: Sakai, Isami,  DO;  Location: [facility] ORS;  Service: General;  Laterality: Right;    Family History: Family History  Problem Relation Age of Onset   Cancer Mother        colon   Heart disease Father        Died of Heart Attack    Social History  reports that she  has been smoking cigarettes. She has never used smokeless tobacco. She reports that she does not drink alcohol and does not use drugs.  Allergies  Allergen Reactions   Other Other (See Comments)    Pt states the gas used to put her to sleep made her have a bad headache, out of it for 3 days, made patient easy to anger   Sulfamethoxazole-Trimethoprim Nausea Only   Penicillins Nausea Only    Did it involve swelling of the face/tongue/throat, SOB, or low BP? No Did it involve sudden or severe rash/hives, skin peeling, or any reaction on the inside of your mouth or nose? No Did you need to seek medical attention at a hospital or doctor's office? No When did it last happen?      1990 If all above answers are NO, may proceed with cephalosporin use.     Medications   Current Facility-Administered Medications:    0.9 %  sodium chloride  infusion, , Intravenous, Continuous, Fausto Burnard LABOR, DO, Last Rate: 40 mL/hr at 05/11/24 2135, New Bag at 05/11/24 2135   acetaminophen  (TYLENOL ) tablet 650 mg, 650 mg, Oral, Q4H PRN **OR** acetaminophen  (TYLENOL ) 160 MG/5ML solution 650 mg, 650 mg, Per Tube, Q4H PRN **OR** acetaminophen  (TYLENOL ) suppository 650 mg, 650 mg, Rectal, Q4H PRN, Fausto Burnard LABOR, DO   aspirin  EC tablet 81 mg, 81 mg, Oral, Daily, Fausto Burnard A, DO, 81 mg at 05/12/24 0800   atorvastatin  (LIPITOR ) tablet 80 mg, 80 mg, Oral, Daily, Fausto Burnard A, DO, 80 mg at 05/12/24 0801   clopidogrel  (PLAVIX ) tablet 75 mg, 75 mg, Oral, Daily, Fausto Burnard A, DO, 75 mg at 05/12/24 0801   enoxaparin  (LOVENOX ) injection 40 mg, 40 mg, Subcutaneous, Q24H, Fausto Burnard A, DO, 40 mg at 05/11/24 2136   isosorbide  mononitrate (IMDUR ) 24 hr tablet 30 mg, 30 mg, Oral, Daily, Fausto Burnard A, DO, 30 mg at 05/12/24 0800   losartan  (COZAAR ) tablet 25 mg, 25 mg, Oral, Daily, Lenon Elsie HERO, RPH, 25 mg at 05/12/24 0800   metoprolol  succinate (TOPROL -XL) 24 hr tablet 25 mg, 25 mg, Oral, Q  breakfast, Lenon Elsie HERO, RPH, 25 mg at 05/12/24 9198   nitroGLYCERIN  (NITROSTAT ) SL tablet 0.4 mg, 0.4 mg, Sublingual, Q5 Min x 3 PRN, Fausto Burnard LABOR, DO   Oral care mouth rinse, 15 mL, Mouth Rinse, PRN, Fausto Burnard A, DO   senna-docusate (Senokot-S) tablet 1 tablet, 1 tablet, Oral, QHS PRN, Fausto Burnard A, DO  No current facility-administered medications on file prior to encounter.   Current Outpatient Medications on File Prior to Encounter  Medication Sig Dispense Refill   aspirin  EC 81 MG EC tablet Take 1 tablet (81 mg total) by mouth daily. Swallow whole. 30 tablet 11   atorvastatin  (LIPITOR ) 80 MG tablet Take 1 tablet (80 mg total) by mouth daily. 30 tablet 3   gabapentin  (NEURONTIN ) 300 MG capsule Take 300 mg by mouth 3 (three) times daily.     isosorbide  mononitrate (IMDUR ) 30 MG 24 hr tablet Take 1 tablet (30 mg total) by mouth daily. 30 tablet 3   losartan  (COZAAR ) 25 MG tablet Take  1 tablet (25 mg total) by mouth daily. 30 tablet 3   metoprolol  succinate (TOPROL -XL) 25 MG 24 hr tablet Take 1 tablet (25 mg total) by mouth daily with breakfast. Take with or immediately following a meal. 30 tablet 3   nitroGLYCERIN  (NITROSTAT ) 0.4 MG SL tablet Place 1 tablet (0.4 mg total) under the tongue every 5 (five) minutes x 3 doses as needed for chest pain. 30 tablet 0     Vitals   Vitals:   May 19, 2024 2053 05/12/24 0018 05/12/24 0405 05/12/24 0904  BP: (!) 156/86 121/71 137/83 115/76  Pulse: 79 63 68 70  Resp: 12 12 16    Temp: 98.1 F (36.7 C) 98.3 F (36.8 C) 98.3 F (36.8 C) (!) 97.4 F (36.3 C)  TempSrc:  Oral Oral   SpO2: 100% 100% 100% 100%  Weight:      Height:        Body mass index is 25 kg/m.   Physical Exam   Constitutional: Appears well-developed and well-nourished.  Psych: Bright affect  Eyes: No scleral injection.  HENT: No OP obstruction.  Head: Normocephalic.  Respiratory: Effort normal, non-labored breathing.    Neurologic Examination    Mental Status: Awake and alert. Poorly oriented (July 23 for the month and day, unable to recall the city or the state, but seems to be trying hard to get the right answer. Does give the correct year. Poor insight. Speech is fluent, but she has paucity of ideas and misses multiple items during a naming task. Able to follow all commands without difficulty. Cranial Nerves: II: Temporal visual fields intact with no extinction to DSS. PERRL. III,IV, VI: No ptosis. EOMI. No nystagmus. V: Temp sensation equal bilaterally VII: Smile symmetric VIII: Hearing intact to voice IX,X: No hypophonia or hoarseness XI: Symmetric XII: Midline tongue extension Motor: RUE: 5/5 LUE: 5/5 RLE: 5/5 LLE: 5/5 Sensory: FT intact x 4.  Deep Tendon Reflexes: 2+ and symmetric bilateral biceps, brachioradialis and patellae Cerebellar: No ataxia with FNF bilaterally Gait: Deferred   Labs/Imaging/Neurodiagnostic studies   CBC:  Recent Labs  Lab 05/19/24 1754  WBC 7.8  NEUTROABS 5.5  HGB 16.9*  HCT 48.5*  MCV 98.6  PLT 258   Basic Metabolic Panel:  Lab Results  Component Value Date   NA 137 19-May-2024   K 3.6 19-May-2024   CO2 21 (L) 2024/05/19   GLUCOSE 102 (H) 19-May-2024   BUN 14 05-19-24   CREATININE 0.89 May 19, 2024   CALCIUM  10.0 05/19/24   GFRNONAA >60 19-May-2024   GFRAA >60 12/03/2019   Lipid Panel:  Lab Results  Component Value Date   LDLCALC 211 (H) 05/12/2024   HgbA1c:  Lab Results  Component Value Date   HGBA1C 6.9 (H) 07/30/2021   Urine Drug Screen: No results found for: LABOPIA, COCAINSCRNUR, LABBENZ, AMPHETMU, THCU, LABBARB  Alcohol Level     Component Value Date/Time   [facility] <15 05-19-24 1754   INR  Lab Results  Component Value Date   INR 1.0 05-19-2024   APTT  Lab Results  Component Value Date   APTT <22 (L) May 19, 2024   TTE: 1. Left ventricular ejection fraction, by estimation, is 50 to 55%. The  left ventricle has low normal function. The  left ventricle has no regional  wall motion abnormalities. There is mild concentric left ventricular  hypertrophy. Left ventricular diastolic parameters are consistent with  Grade I diastolic dysfunction (impaired relaxation).   2. Right ventricular systolic function is normal. The right ventricular  size is normal. Moderately increased right ventricular wall thickness.   3. The mitral valve is normal in structure. Trivial mitral valve  regurgitation.   4. The aortic valve is normal in structure. Aortic valve regurgitation is  not visualized.     ASSESSMENT  63 y.o. female with PMHx of left gluteal abscess requiring recurrent drainage, arthritis, CAD s/p stenting, depression, hyperlipidemia and HTN who presented to the ED yesterday evening with confusion, difficulty speaking and LUE weakness. Her symptoms started over 3 days prior to presentation. She was noted to have obvious word-finding difficulty on EDP exam.CT head revealed a left frontal lobe acute vs subacute infarct, as well as atrophy and chronic small vessel ischemic changes.  - Exam reveals a poorly oriented female appearing older than her chronological age. No focal motor weakness is appreciated.  - MRI brain: Intermediate sized acute infarct of the left frontal lobe and punctate foci of acute ischemia within both occipital lobes. Multifocal hyperintense T2-weighted signal within the cerebral white matter, most commonly due to chronic small vessel disease. Mild generalized volume loss. - CTA of head and neck: No large vessel occlusion or hemodynamically significant stenosis in the head or neck. 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  - TTE is documented above. No mural thrombus or valvular vegetation mentioned in the report.  - EKG: NSR; inferior and anterior infarcts, age undetermined.  - Impression: Acute ischemic left frontal lobe infarction in conjunction with punctate  infarctions in bilateral occipital lobes. Given separate vascular territories, a cardioembolic mechanism is suspected.   RECOMMENDATIONS  - Agree with adding Plavix  to her home ASA regimen.  - Continue home atorvastatin .  - BP management with goal of 120/80. Out of the permissive HTN time window.  MRI brain - Cardiac telemetry.  - HgbA1c and lipid panel - PT consult, OT consult, Speech consult - Risk factor modification - Frequent neuro checks - NPO until passes stroke swallow screen - Zio patch or loop recorder placement if telemetry here is negative for episodes of atrial fibrillation.  - TEE should also be obtained given no clear structural etiology for her stroke so far on imaging and TTE.  ______________________________________________________________________    Bonney SHARK, Jeannie Mallinger, MD [facility]

## 2024-05-12 NOTE — Progress Notes (Signed)
*  PRELIMINARY RESULTS* Echocardiogram 2D Echocardiogram has been performed.  Sylvia Wiggins 05/12/2024, 8:51 AM

## 2024-05-12 NOTE — Care Management Obs Status (Signed)
 MEDICARE OBSERVATION STATUS NOTIFICATION   Patient Details  Name: Sylvia Wiggins MRN: [id number] Date of Birth: 1961/07/28   Medicare Observation Status Notification Given:  Chaney BRANDY CHRISTIANE LELON, CMA 05/12/2024, 10:10 AM

## 2024-05-12 NOTE — Discharge Instructions (Signed)
 Some PCP options in [place] area- not a comprehensive list  [facility]- [phone] [facility]- [phone] [facility]- [phone] [facility]- [phone] [facility]- [phone] [facility]- [phone]  or [facility] Physician Referral Line [phone]

## 2024-05-13 DIAGNOSIS — F32A Depression, unspecified: Secondary | ICD-10-CM | POA: Diagnosis present

## 2024-05-13 DIAGNOSIS — F17210 Nicotine dependence, cigarettes, uncomplicated: Secondary | ICD-10-CM | POA: Diagnosis present

## 2024-05-13 DIAGNOSIS — R4781 Slurred speech: Secondary | ICD-10-CM | POA: Diagnosis present

## 2024-05-13 DIAGNOSIS — R29703 NIHSS score 3: Secondary | ICD-10-CM | POA: Diagnosis present

## 2024-05-13 DIAGNOSIS — E785 Hyperlipidemia, unspecified: Secondary | ICD-10-CM | POA: Diagnosis present

## 2024-05-13 DIAGNOSIS — Z880 Allergy status to penicillin: Secondary | ICD-10-CM | POA: Diagnosis not present

## 2024-05-13 DIAGNOSIS — I2510 Atherosclerotic heart disease of native coronary artery without angina pectoris: Secondary | ICD-10-CM | POA: Diagnosis present

## 2024-05-13 DIAGNOSIS — I6389 Other cerebral infarction: Secondary | ICD-10-CM | POA: Diagnosis present

## 2024-05-13 DIAGNOSIS — Z7982 Long term (current) use of aspirin: Secondary | ICD-10-CM | POA: Diagnosis not present

## 2024-05-13 DIAGNOSIS — G8324 Monoplegia of upper limb affecting left nondominant side: Secondary | ICD-10-CM | POA: Diagnosis present

## 2024-05-13 DIAGNOSIS — Z79899 Other long term (current) drug therapy: Secondary | ICD-10-CM | POA: Diagnosis not present

## 2024-05-13 DIAGNOSIS — Z955 Presence of coronary angioplasty implant and graft: Secondary | ICD-10-CM | POA: Diagnosis not present

## 2024-05-13 DIAGNOSIS — I252 Old myocardial infarction: Secondary | ICD-10-CM | POA: Diagnosis not present

## 2024-05-13 DIAGNOSIS — G9341 Metabolic encephalopathy: Secondary | ICD-10-CM | POA: Diagnosis present

## 2024-05-13 DIAGNOSIS — Z8249 Family history of ischemic heart disease and other diseases of the circulatory system: Secondary | ICD-10-CM | POA: Diagnosis not present

## 2024-05-13 DIAGNOSIS — I5189 Other ill-defined heart diseases: Secondary | ICD-10-CM | POA: Diagnosis present

## 2024-05-13 DIAGNOSIS — I10 Essential (primary) hypertension: Secondary | ICD-10-CM | POA: Diagnosis present

## 2024-05-13 DIAGNOSIS — Z1152 Encounter for screening for COVID-19: Secondary | ICD-10-CM | POA: Diagnosis not present

## 2024-05-13 DIAGNOSIS — I639 Cerebral infarction, unspecified: Secondary | ICD-10-CM | POA: Diagnosis present

## 2024-05-13 DIAGNOSIS — R29810 Facial weakness: Secondary | ICD-10-CM | POA: Diagnosis present

## 2024-05-13 DIAGNOSIS — Z882 Allergy status to sulfonamides status: Secondary | ICD-10-CM | POA: Diagnosis not present

## 2024-05-13 LAB — BASIC METABOLIC PANEL WITH GFR
Anion gap: 8 (ref 5–15)
BUN: 17 mg/dL (ref 8–23)
CO2: 27 mmol/L (ref 22–32)
Calcium: 9.6 mg/dL (ref 8.9–10.3)
Chloride: 102 mmol/L (ref 98–111)
Creatinine, Ser: 0.9 mg/dL (ref 0.44–1.00)
GFR, Estimated: >60 mL/min (ref >60)
Glucose, Bld: 91 mg/dL (ref 70–99)
Potassium: 3.7 mmol/L (ref 3.5–5.1)
Sodium: 137 mmol/L (ref 135–145)

## 2024-05-13 LAB — CBC WITH DIFFERENTIAL/PLATELET
Abs Immature Granulocytes: 0.03 K/uL (ref 0.00–0.07)
Basophils Absolute: 0.1 K/uL (ref 0.0–0.1)
Basophils Relative: 1 %
Eosinophils Absolute: 0.2 K/uL (ref 0.0–0.5)
Eosinophils Relative: 3 %
HCT: 41.5 % (ref 36.0–46.0)
Hemoglobin: 14 g/dL (ref 12.0–15.0)
Immature Granulocytes: 1 %
Lymphocytes Relative: 30 %
Lymphs Abs: 1.8 K/uL (ref 0.7–4.0)
MCH: 33.7 pg (ref 26.0–34.0)
MCHC: 33.7 g/dL (ref 30.0–36.0)
MCV: 99.8 fL (ref 80.0–100.0)
Monocytes Absolute: 0.5 K/uL (ref 0.1–1.0)
Monocytes Relative: 8 %
Neutro Abs: 3.5 K/uL (ref 1.7–7.7)
Neutrophils Relative %: 57 %
Platelets: 215 K/uL (ref 150–400)
RBC: 4.16 MIL/uL (ref 3.87–5.11)
RDW: 12.2 % (ref 11.5–15.5)
WBC: 6.1 K/uL (ref 4.0–10.5)
nRBC: 0 % (ref 0.0–0.2)

## 2024-05-13 MED ORDER — SENNOSIDES-DOCUSATE SODIUM 8.6-50 MG PO TABS: 1.0000 | ORAL_TABLET | Freq: Every evening | ORAL | 0 refills | Status: AC | PRN

## 2024-05-13 MED ORDER — CLOPIDOGREL BISULFATE 75 MG PO TABS: 75.0000 mg | ORAL_TABLET | Freq: Every day | ORAL | 0 refills | Status: AC

## 2024-05-13 NOTE — Progress Notes (Signed)
 Orders only for long term cardiac monitor for acute stroke.   Sylvia COMUNALE DECOSTE, PA

## 2024-05-13 NOTE — TOC Transition Note (Signed)
 Transition of Care [facility]) - Discharge Note   Patient Details  Name: Sylvia Wiggins MRN: [id number] Date of Birth: 06-28-1961  [facility] of [facility]) CM/SW Contact:  Dalia GORMAN Fuse, RN Phone Number: 05/13/2024, 11:51 AM   Clinical Narrative:     Patient is from home with roommates. She drives and is able to get to and from appointments without difficulty. Recommendations are for outpatient PT/OT, but the patient doesn't have a PCP. TOC placed a list of PCPs on the patient's AVS and encouraged her to call to schedule an appointment. No other TOC needs identified.    Final next level of care: Home/Self Care Barriers to Discharge: Barriers Resolved  Patient Goals and CMS Choice            Discharge Placement                       Discharge Plan and Services Additional resources added to the After Visit Summary for                                       Social Drivers of Health (SDOH) Interventions SDOH Screenings   Food Insecurity: No Food Insecurity (05/11/2024)  Housing: Low Risk  (05/11/2024)  Transportation Needs: No Transportation Needs (05/11/2024)  Utilities: Not At Risk (05/11/2024)  Tobacco Use: High Risk (05/11/2024)     Readmission Risk Interventions     No data to display

## 2024-05-13 NOTE — Discharge Summary (Addendum)
 Physician Discharge Summary   Patient: Sylvia Wiggins MRN: [id number] DOB: 02/23/1961  Admit date:     05/11/2024  Discharge date: 05/13/24  Discharge Physician: Drue ONEIDA Potter   PCP: Patient, [name]   Recommendations at discharge:  Follow-up with cardiology as well as cardiology  Discharge Diagnoses:  Acute ischemic stroke  Acute metabolic encephalopathy CAD  HTN HLD  Hospital Course:  Sylvia Wiggins is a 63 y.o. female with medical history significant of CAD s/p NSTEMI, HTN, HLD, depression, chronic back pain who presented to the ED for evaluation of confusion, difficulty speaking, left upper extremity weakness.  Symptoms reportedly started over 3 days ago.  Pt herself tells me she feels normal, but having obvious word-finding difficulty.  Her friend present at bedside reports pt had difficulty finding words, slurred speech, confusion, staggering gait, with left facial droop and left upper extremity weakness. CT head w/o contrast shows a left frontal lobe acute vs subacute infarct, as well as atrophy and chronic small vessel ischemic changes. Patient was seen by neurologist , echocardiogram showed normal EF with grade 1 diastolic dysfunction.  Patient has been cleared for discharge today by neurologist and patient will have an outpatient TEE that have been arranged by cardiology.  Patient have had a cardiac monitor also placed by cardiology before discharge.  Consultants: Cardiology, neurology Procedures performed: As mentioned above Disposition: Home Diet recommendation:  Cardiac diet DISCHARGE MEDICATION: Allergies as of 05/13/2024       Reactions   Other Other (See Comments)   Pt states the gas used to put her to sleep made her have a bad headache, out of it for 3 days, made patient easy to anger   Sulfamethoxazole-trimethoprim Nausea Only   Penicillins Nausea Only   Did it involve swelling of the face/tongue/throat, SOB, or low BP? No Did it involve sudden or severe  rash/hives, skin peeling, or any reaction on the inside of your mouth or nose? No Did you need to seek medical attention at a hospital or doctor's office? No When did it last happen?      1990 If all above answers are NO, may proceed with cephalosporin use.        Medication List     TAKE these medications    aspirin  EC 81 MG tablet Take 1 tablet (81 mg total) by mouth daily. Swallow whole.   atorvastatin  80 MG tablet Commonly known as: LIPITOR  Take 1 tablet (80 mg total) by mouth daily.   clopidogrel  75 MG tablet Commonly known as: PLAVIX  Take 1 tablet (75 mg total) by mouth daily for 21 days. Start taking on: May 14, 2024   gabapentin  300 MG capsule Commonly known as: NEURONTIN  Take 300 mg by mouth 3 (three) times daily.   isosorbide  mononitrate 30 MG 24 hr tablet Commonly known as: IMDUR  Take 1 tablet (30 mg total) by mouth daily.   losartan  25 MG tablet Commonly known as: COZAAR  Take 1 tablet (25 mg total) by mouth daily.   metoprolol  succinate 25 MG 24 hr tablet Commonly known as: TOPROL -XL Take 1 tablet (25 mg total) by mouth daily with breakfast. Take with or immediately following a meal.   nitroGLYCERIN  0.4 MG SL tablet Commonly known as: NITROSTAT  Place 1 tablet (0.4 mg total) under the tongue every 5 (five) minutes x 3 doses as needed for chest pain.   senna-docusate 8.6-50 MG tablet Commonly known as: Senokot-S Take 1 tablet by mouth at bedtime as needed for mild  constipation.        Follow-up Information     Florencio Cara BIRCH, MD. Go on 06/05/2024.   Specialties: Cardiology, Internal Medicine Why: @ 1230 WITH DR. TINNIE MAIDEN Contact information: [address] [place] [address] [phone]                Discharge Exam: Fredricka Weights   05/11/24 1752 05/11/24 2052  Weight: 66.1 kg 62 kg   General exam: Age female laying in bed in no acute distress HEENT: atraumatic, clear conjunctiva, anicteric sclera, moist  mucus membranes, hearing grossly normal  Respiratory system: CTAB, no wheezes, rales or rhonchi, normal respiratory effort. Cardiovascular system: normal S1/S2, RRR, no JVD, murmurs, rubs, gallops, no pedal edema.   Gastrointestinal system: soft, NT, ND, no HSM felt, +bowel sounds. Central nervous system: Alert and oriented, confusion resolved Extremities: moves all, no edema, normal tone Skin: dry, intact, normal temperature, normal color, No rashes, lesions or ulcers seen on visualized skin    Condition at discharge: good  The results of significant diagnostics from this hospitalization (including imaging, microbiology, ancillary and laboratory) are listed below for reference.   Imaging Studies: ECHOCARDIOGRAM COMPLETE Result Date: 05/12/2024    ECHOCARDIOGRAM REPORT   Patient Name:   Sylvia Wiggins Date of Exam: 05/12/2024 Medical Rec #:  [id number]      Height:       62.0 in Accession #:    [id number]     Weight:       136.7 lb Date of Birth:  02/22/1961      BSA:          1.626 m Patient Age:    63 years       BP:           137/68 mmHg Patient Gender: F              HR:           68 bpm. Exam Location:  [facility] Procedure: 2D Echo, Cardiac Doppler, Color Doppler and Saline Contrast Bubble            Study (Both Spectral and Color Flow Doppler were utilized during            procedure). Indications:     Stroke I63.9  History:         Patient has prior history of Echocardiogram examinations, most                  recent 07/31/2021. Previous Myocardial Infarction and CAD; Risk                  Factors:Hypertension.  Sonographer:     Christopher Furnace Referring Phys:  [id number] KELLY A GRIFFITH Diagnosing Phys: Cara BIRCH Florencio MD  Sonographer Comments: Suboptimal parasternal window. IMPRESSIONS  1. Left ventricular ejection fraction, by estimation, is 50 to 55%. The left ventricle has low normal function. The left ventricle has no regional wall motion abnormalities. There is mild concentric left ventricular  hypertrophy. Left ventricular diastolic parameters are consistent with Grade I diastolic dysfunction (impaired relaxation).  2. Right ventricular systolic function is normal. The right ventricular size is normal. Moderately increased right ventricular wall thickness.  3. The mitral valve is normal in structure. Trivial mitral valve regurgitation.  4. The aortic valve is normal in structure. Aortic valve regurgitation is not visualized. FINDINGS  Left Ventricle: Left ventricular ejection fraction, by estimation, is 50 to 55%. The left ventricle has low normal function.  The left ventricle has no regional wall motion abnormalities. Strain was performed and the global longitudinal strain is indeterminate. The left ventricular internal cavity size was normal in size. There is mild concentric left ventricular hypertrophy. Left ventricular diastolic parameters are consistent with Grade I diastolic dysfunction (impaired relaxation). Right Ventricle: The right ventricular size is normal. Moderately increased right ventricular wall thickness. Right ventricular systolic function is normal. Left Atrium: Left atrial size was normal in size. Right Atrium: Right atrial size was normal in size. Pericardium: There is no evidence of pericardial effusion. Mitral Valve: The mitral valve is normal in structure. Trivial mitral valve regurgitation. Tricuspid Valve: The tricuspid valve is normal in structure. Tricuspid valve regurgitation is mild. Aortic Valve: The aortic valve is normal in structure. Aortic valve regurgitation is not visualized. Aortic valve mean gradient measures 3.0 mmHg. Aortic valve peak gradient measures 4.8 mmHg. Pulmonic Valve: The pulmonic valve was normal in structure. Pulmonic valve regurgitation is not visualized. Aorta: The ascending aorta was not well visualized. IAS/Shunts: No atrial level shunt detected by color flow Doppler. Agitated saline contrast was given intravenously to evaluate for intracardiac  shunting. Additional Comments: 3D was performed not requiring image post processing on an independent workstation and was indeterminate.  LEFT VENTRICLE PLAX 2D LVIDd:         4.70 cm LVIDs:         3.40 cm LV PW:         1.00 cm LV IVS:        1.30 cm  RIGHT VENTRICLE RV Basal diam:  2.60 cm RV Mid diam:    2.60 cm LEFT ATRIUM           Index       RIGHT ATRIUM           Index LA diam:      1.80 cm 1.11 cm/m  RA Area:     10.20 cm LA Vol (A2C): 15.2 ml 9.35 ml/m  RA Volume:   23.00 ml  14.14 ml/m LA Vol (A4C): 12.6 ml 7.75 ml/m  AORTIC VALVE AV Vmax:           110.00 cm/s AV Vmean:          78.700 cm/s AV VTI:            0.199 m AV Peak Grad:      4.8 mmHg AV Mean Grad:      3.0 mmHg LVOT Vmax:         64.70 cm/s LVOT Vmean:        47.900 cm/s LVOT VTI:          0.134 m LVOT/AV VTI ratio: 0.67  AORTA Ao Root diam: 3.50 cm MITRAL VALVE               TRICUSPID VALVE MV Area (PHT): 3.79 cm    TR Peak grad:   18.1 mmHg MV Decel Time: 200 msec    TR Vmax:        213.00 cm/s MV E velocity: 57.60 cm/s MV A velocity: 92.80 cm/s  SHUNTS MV E/A ratio:  0.62        Systemic VTI: 0.13 m Cara JONETTA Lovelace MD Electronically signed by Cara JONETTA Lovelace MD Signature Date/Time: 05/12/2024/11:59:51 AM    Final    MR BRAIN WO CONTRAST Result Date: 05/11/2024 EXAM: MRI BRAIN WITHOUT CONTRAST 05/11/2024 10:00:06 PM TECHNIQUE: Multiplanar multisequence MRI of the head/brain was performed without the administration of intravenous contrast.  COMPARISON: 06/02/2016 CLINICAL HISTORY: Stroke, follow up. PT comes in via pov with with complaints of confusion and difficulty forming words. Pt's last known well was 3 days ago. Pt with left sided weakness and left sided facial droop, and reports slurred speech, and staggering 3 days ago. Pt with no complaints of pain at this time. Pt is alert and oriented x2. FINDINGS: BRAIN AND VENTRICLES: Intermediate sized acute infarct of the left frontal lobe. Punctate foci of acute ischemia within both  occipital lobes. Multifocal hyperintense T2-weighted signal within the cerebral white matter, most commonly due to chronic small vessel disease. Mild generalized volume loss. No intracranial hemorrhage. No mass. No midline shift. No hydrocephalus. The sella is unremarkable. Normal flow voids. ORBITS: No acute abnormality. SINUSES AND MASTOIDS: No acute abnormality. BONES AND SOFT TISSUES: Normal marrow signal. No acute soft tissue abnormality. IMPRESSION: 1. Intermediate sized acute infarct of the left frontal lobe and punctate foci of acute ischemia within both occipital lobes. 2. Multifocal hyperintense T2-weighted signal within the cerebral white matter, most commonly due to chronic small vessel disease. 3. Mild generalized volume loss. Electronically signed by: Franky Stanford MD 05/11/2024 10:05 PM EDT RP Workstation: [id number]   DG Abd Portable 1V Result Date: 05/11/2024 CLINICAL DATA:  Stroke EXAM: PORTABLE ABDOMEN - 1 VIEW COMPARISON:  CT 03/29/2013 FINDINGS: Contrast within the renal collecting systems and bladder. Nonobstructed bowel-gas pattern. No unexpected metallic foreign object is seen. IMPRESSION: Nonobstructed bowel-gas pattern. Negative for unexpected metallic foreign object over the included abdomen and pelvis. Electronically Signed   By: Luke Bun M.D.   On: 05/11/2024 21:47   DG Chest Port 1 View Result Date: 05/11/2024 CLINICAL DATA:  Stroke follow-up EXAM: PORTABLE CHEST 1 VIEW COMPARISON:  07/30/2021 FINDINGS: No acute airspace disease or effusion. Normal cardiomediastinal silhouette with aortic atherosclerosis. Coronary stents are noted. Metallic snaps over the supraclavicular soft tissues. Aortic atherosclerosis. IMPRESSION: No active disease. Chronic coronary stents. No unexpected metallic density foreign bodies. Electronically Signed   By: Luke Bun M.D.   On: 05/11/2024 21:45   CT ANGIO HEAD NECK W WO CM Result Date: 05/11/2024 EXAM: CTA HEAD AND NECK WITH AND WITHOUT  05/11/2024 08:25:59 PM TECHNIQUE: CTA of the head and neck was performed with and without the administration of intravenous contrast. Multiplanar 2D and/or 3D reformatted images are provided for review. Automated exposure control, iterative reconstruction, and/or weight based adjustment of the mA/kV was utilized to reduce the radiation dose to as low as reasonably achievable. Stenosis of the internal carotid arteries measured using NASCET criteria. COMPARISON: None available CLINICAL HISTORY: Stroke/TIA, determine embolic source. PT comes in via pov with complaints of confusion and difficulty forming words. Pt's last known well was 3 days ago. Pt with left sided weakness and left sided facial droop, and reports slurred speech, and staggering 3 days ago. Pt with no complaints of pain at this time. Pt is alert and oriented x2. FINDINGS: CTA NECK: AORTIC ARCH AND ARCH VESSELS: Calcific aortic atherosclerosis. CERVICAL CAROTID ARTERIES: Mixed density atherosclerosis at the right carotid bifurcation extending into the ICA without hemodynamically significant stenosis. Moderate calcific atherosclerosis of the left carotid bifurcation with multifocal atherosclerotic calcifications in the ICA but no hemodynamically significant stenosis. CERVICAL VERTEBRAL ARTERIES: Mild atherosclerotic calcification of both vertebral artery V4 segments without significant stenosis. LUNGS AND MEDIASTINUM: Unremarkable. SOFT TISSUES: No acute abnormality. BONES: No acute abnormality. 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 There is  mild atherosclerotic calcification of both ICA cavernous segments without significant stenosis. No stenosis of the middle or anterior cerebral arteries. POSTERIOR CIRCULATION: No significant stenosis of the posterior cerebral arteries. No significant stenosis of the basilar artery. No significant stenosis of the vertebral  arteries. No aneurysm. OTHER: No dural venous sinus thrombosis on this non-dedicated study. IMPRESSION: 1. No large vessel occlusion or hemodynamically significant stenosis in the head or neck. 2. 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 Electronically signed by: Franky Stanford MD 05/11/2024 08:55 PM EDT RP Workstation: [id number]   CT HEAD WO CONTRAST Result Date: 05/11/2024 CLINICAL DATA:  Facial droop confusion EXAM: CT HEAD WITHOUT CONTRAST TECHNIQUE: Contiguous axial images were obtained from the base of the skull through the vertex without intravenous contrast. RADIATION DOSE REDUCTION: This exam was performed according to the departmental dose-optimization program which includes automated exposure control, adjustment of the mA and/or kV according to patient size and/or use of iterative reconstruction technique. COMPARISON:  CT brain 01/28/2019, MRI 06/02/2016 FINDINGS: Brain: Focal hypodensity and loss of gray-white matter differentiation in the left frontal lobe consistent with acute or subacute infarct. No hemorrhage. No significant mass effect or midline shift. Atrophy and chronic small vessel ischemic changes of the white matter. Slight interval ventricular enlargement compared to prior head CT, likely due to atrophy progression. Vascular: No hyperdense vessels.  No unexpected calcification Skull: Normal. Negative for fracture or focal lesion. Sinuses/Orbits: No acute finding. Other: None IMPRESSION: 1. Focal hypodensity and loss of gray-white matter differentiation in the left frontal lobe consistent with acute or subacute infarct. No hemorrhage. No midline shift or significant mass effect 2. Atrophy and chronic small vessel ischemic changes of the white matter. Electronically Signed   By: Luke Bun M.D.   On: 05/11/2024 18:50    Microbiology: Results for orders placed or performed during the hospital encounter of 07/30/21  Resp Panel by  RT-PCR (Flu A&B, Covid) Nasopharyngeal Swab     Status: None   Collection Time: 07/30/21  7:22 PM   Specimen: Nasopharyngeal Swab; Nasopharyngeal(NP) swabs in vial transport medium  Result Value Ref Range Status   SARS Coronavirus 2 by RT PCR NEGATIVE NEGATIVE Final    Comment: (NOTE) SARS-CoV-2 target nucleic acids are NOT DETECTED.  The SARS-CoV-2 RNA is generally detectable in upper respiratory specimens during the acute phase of infection. The lowest concentration of SARS-CoV-2 viral copies this assay can detect is 138 copies/mL. A negative result does not preclude SARS-Cov-2 infection and should not be used as the sole basis for treatment or other patient management decisions. A negative result may occur with  improper specimen collection/handling, submission of specimen other than nasopharyngeal swab, presence of viral mutation(s) within the areas targeted by this assay, and inadequate number of viral copies(<138 copies/mL). A negative result must be combined with clinical observations, patient history, and epidemiological information. The expected result is Negative.  Fact Sheet for Patients:  BloggerCourse.com  Fact Sheet for Healthcare Providers:  SeriousBroker.it  This test is no t yet approved or cleared by the [place]  FDA and  has been authorized for detection and/or diagnosis of SARS-CoV-2 by FDA under an Emergency Use Authorization (EUA). This EUA will remain  in effect (meaning this test can be used) for the duration of the COVID-19 declaration under Section 564(b)(1) of the Act, 21 U.S.C.section 360bbb-3(b)(1), unless the authorization is terminated  or revoked sooner.       Influenza A by PCR  NEGATIVE NEGATIVE Final   Influenza B by PCR NEGATIVE NEGATIVE Final    Comment: (NOTE) The Xpert Xpress SARS-CoV-2/FLU/RSV plus assay is intended as an aid in the diagnosis of influenza from Nasopharyngeal swab  specimens and should not be used as a sole basis for treatment. Nasal washings and aspirates are unacceptable for Xpert Xpress SARS-CoV-2/FLU/RSV testing.  Fact Sheet for Patients: BloggerCourse.com  Fact Sheet for Healthcare Providers: SeriousBroker.it  This test is not yet approved or cleared by the [place]  FDA and has been authorized for detection and/or diagnosis of SARS-CoV-2 by FDA under an Emergency Use Authorization (EUA). This EUA will remain in effect (meaning this test can be used) for the duration of the COVID-19 declaration under Section 564(b)(1) of the Act, 21 U.S.C. section 360bbb-3(b)(1), unless the authorization is terminated or revoked.  Performed at [facility], [address] [place], [place] [address]     Labs: CBC: Recent Labs  Lab 05/11/24 1754 05/12/24 1714 05/13/24 0345  WBC 7.8 7.8 6.1  NEUTROABS 5.5 5.5 3.5  HGB 16.9* 14.5 14.0  HCT 48.5* 41.1 41.5  MCV 98.6 98.6 99.8  PLT 258 245 215   Basic Metabolic Panel: Recent Labs  Lab 05/11/24 1754 05/13/24 0345  NA 137 137  K 3.6 3.7  CL 102 102  CO2 21* 27  GLUCOSE 102* 91  BUN 14 17  CREATININE 0.89 0.90  CALCIUM  10.0 9.6   Liver Function Tests: Recent Labs  Lab 05/11/24 1754  AST 25  ALT 20  ALKPHOS 77  BILITOT 1.2  PROT 8.3*  ALBUMIN 4.6   CBG: No results for input(s): GLUCAP in the last 168 hours.  Discharge time spent:  35 minutes.  Signed: Drue ONEIDA Potter, MD [facility] 05/13/2024

## 2024-05-13 NOTE — Progress Notes (Signed)
 Mobility Specialist - Progress Note    05/13/24 1128  Mobility  Activity Ambulated independently  Level of Assistance Independent  Assistive Device Front wheel walker  Distance Ambulated (ft) 160 ft  Range of Motion/Exercises Active  Activity Response Tolerated well  Mobility Referral Yes  Mobility visit 1 Mobility  Mobility Specialist Start Time (ACUTE ONLY) 1100  Mobility Specialist Stop Time (ACUTE ONLY) 1115  Mobility Specialist Time Calculation (min) (ACUTE ONLY) 15 min   Pt resting in bed on RA upon entry. Pt STS and ambulates to hallway around NS Indep with RW. Pt maintains upright walking mechanics during session. Pt returned to bed and left with needs in reach.   Guido Rumble Mobility Specialist 05/13/24, 11:32 AM

## 2024-05-13 NOTE — Plan of Care (Signed)
 Patient has remained free from any noted signs of acute distress.  Has not required any additional medical intervention(s).  Verbally denied any pain/discomfort.  Did not require prn pain medication.  Remains free from any noted signs of associated symptoms.  Patient to continue to be monitored by hospital staff until discharge.

## 2024-05-13 NOTE — Progress Notes (Signed)
 Physical Therapy Treatment Patient Details Name: Sylvia Wiggins MRN: [id number] DOB: [birth year] Today's Date: 05/13/2024   History of Present Illness Pt is a 63 y.o. female who presented to the ED for evaluation of confusion, difficulty speaking, left upper extremity weakness for last 3-4 days. 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 PMH of CAD s/p NSTEMI, HTN, HLD, depression, chronic back pain    PT Comments  Pt received upright in bed agreeable to PT. Completes all mobility this date either independently or mod-I with the stairs using rail. Pt with normalized gait completing short community distances with good ability to navigate many obstacles in hallway. Pt reports being at baseline. Denies any f/u PT needs. PT in agreement. Pt returns to room and independently fixes bed sheets. Pt left with all needs in reach. PT to sign off in house as pt has no acute PT needs.     If plan is discharge home, recommend the following: Assist for transportation   Can travel by private vehicle        Equipment Recommendations  None recommended by PT    Recommendations for Other Services       Precautions / Restrictions Precautions Precautions: Fall Recall of Precautions/Restrictions: Intact Restrictions Weight Bearing Restrictions Per Provider Order: No     Mobility  Bed Mobility Overal bed mobility: Independent               Patient Response: Cooperative  Transfers Overall transfer level: Independent                      Ambulation/Gait Ambulation/Gait assistance: Independent Gait Distance (Feet): 400 Feet Assistive device: None Gait Pattern/deviations: WFL(Within Functional Limits), Step-through pattern Gait velocity: 10' in 4 sec = 2.5'/sec Gait velocity interpretation: 1.31 - 2.62 ft/sec, indicative of limited community ambulator   General Gait Details: completing short community distances  without changes in pace. Good tolerance in obstacle navigation   Stairs Stairs: Yes Stairs assistance: Modified independent (Device/Increase time) Stair Management: One rail Right, One rail Left, Alternating pattern, Forwards Number of Stairs: 8 General stair comments: asc/desc 4 steps twice without balance deficit   Wheelchair Mobility     Tilt Bed Tilt Bed Patient Response: Cooperative  Modified Rankin (Stroke Patients Only)       Balance Overall balance assessment: Mild deficits observed, not formally tested                                          Communication    Cognition Arousal: Alert Behavior During Therapy: WFL for tasks assessed/performed   PT - Cognitive impairments: No apparent impairments                       PT - Cognition Comments: no evidence of word finding difficulties today during PT session Following commands: Intact      Cueing    Exercises      General Comments        Pertinent Vitals/Pain Pain Assessment Pain Assessment: No/denies pain    Home Living                          Prior Function            PT Goals (current goals  can now be found in the care plan section) Acute Rehab PT Goals Patient Stated Goal: return home PT Goal Formulation: With patient Time For Goal Achievement: 05/26/24 Potential to Achieve Goals: Good Progress towards PT goals: Progressing toward goals    Frequency           PT Plan      Co-evaluation              AM-PAC PT 6 Clicks Mobility   Outcome Measure  Help needed turning from your back to your side while in a flat bed without using bedrails?: None Help needed moving from lying on your back to sitting on the side of a flat bed without using bedrails?: None Help needed moving to and from a bed to a chair (including a wheelchair)?: None Help needed standing up from a chair using your arms (e.g., wheelchair or bedside chair)?: None Help  needed to walk in hospital room?: None Help needed climbing 3-5 steps with a railing? : A Little 6 Click Score: 23    End of Session   Activity Tolerance: Patient tolerated treatment well Patient left: in bed;with call bell/phone within reach;with bed alarm set Nurse Communication: Mobility status PT Visit Diagnosis: Other abnormalities of gait and mobility (R26.89)     Time: 9081-9073 PT Time Calculation (min) (ACUTE ONLY): 8 min  Charges:    $Therapeutic Activity: 8-22 mins PT General Charges $$ ACUTE PT VISIT: 1 Visit                     Dorina HERO. Fairly IV, PT, DPT Physical Therapist- [facility] 05/13/2024, 9:54 AM

## 2024-05-15 ENCOUNTER — Other Ambulatory Visit: Payer: Self-pay

## 2024-05-15 ENCOUNTER — Ambulatory Visit
Admission: RE | Admit: 2024-05-15 | Discharge: 2024-05-15 | Disposition: A | Discharge status: Home / Self Care | Attending: Cardiology | Admitting: Cardiology

## 2024-05-15 ENCOUNTER — Encounter
Admission: RE | Disposition: A | Discharge status: Home / Self Care | Payer: Self-pay | Source: Home / Self Care | Attending: Cardiology

## 2024-05-15 ENCOUNTER — Ambulatory Visit: Admitting: Anesthesiology

## 2024-05-15 ENCOUNTER — Encounter: Payer: Self-pay | Admitting: Cardiology

## 2024-05-15 ENCOUNTER — Ambulatory Visit
Admission: RE | Admit: 2024-05-15 | Discharge: 2024-05-15 | Disposition: A | Discharge status: Home / Self Care | Source: Home / Self Care

## 2024-05-15 ENCOUNTER — Ambulatory Visit: Admission: RE | Admit: 2024-05-15 | Source: Home / Self Care | Admitting: Cardiology

## 2024-05-15 DIAGNOSIS — Z7982 Long term (current) use of aspirin: Secondary | ICD-10-CM | POA: Diagnosis not present

## 2024-05-15 DIAGNOSIS — I2510 Atherosclerotic heart disease of native coronary artery without angina pectoris: Secondary | ICD-10-CM | POA: Insufficient documentation

## 2024-05-15 DIAGNOSIS — I639 Cerebral infarction, unspecified: Secondary | ICD-10-CM | POA: Diagnosis present

## 2024-05-15 DIAGNOSIS — I252 Old myocardial infarction: Secondary | ICD-10-CM | POA: Insufficient documentation

## 2024-05-15 DIAGNOSIS — I10 Essential (primary) hypertension: Secondary | ICD-10-CM | POA: Diagnosis not present

## 2024-05-15 DIAGNOSIS — Z79899 Other long term (current) drug therapy: Secondary | ICD-10-CM | POA: Insufficient documentation

## 2024-05-15 DIAGNOSIS — F17210 Nicotine dependence, cigarettes, uncomplicated: Secondary | ICD-10-CM | POA: Diagnosis not present

## 2024-05-15 DIAGNOSIS — J449 Chronic obstructive pulmonary disease, unspecified: Secondary | ICD-10-CM | POA: Insufficient documentation

## 2024-05-15 DIAGNOSIS — Z955 Presence of coronary angioplasty implant and graft: Secondary | ICD-10-CM | POA: Diagnosis not present

## 2024-05-15 DIAGNOSIS — E785 Hyperlipidemia, unspecified: Secondary | ICD-10-CM | POA: Insufficient documentation

## 2024-05-15 DIAGNOSIS — Z7902 Long term (current) use of antithrombotics/antiplatelets: Secondary | ICD-10-CM | POA: Insufficient documentation

## 2024-05-15 HISTORY — PX: TEE WITHOUT CARDIOVERSION: SHX5443

## 2024-05-15 LAB — ECHO TEE

## 2024-05-15 SURGERY — ECHOCARDIOGRAM, TRANSESOPHAGEAL
Anesthesia: General

## 2024-05-15 MED ORDER — LIDOCAINE HCL (PF) 2 % IJ SOLN
INTRAMUSCULAR | Status: DC | PRN
  Administered 2024-05-15: 40 mg via INTRADERMAL

## 2024-05-15 MED ORDER — SODIUM CHLORIDE 0.9 % IV SOLN: INTRAVENOUS | Status: DC

## 2024-05-15 MED ORDER — PROPOFOL 10 MG/ML IV BOLUS
INTRAVENOUS | Status: DC | PRN
  Administered 2024-05-15: 50 mg via INTRAVENOUS
  Administered 2024-05-15 (×4): 20 mg via INTRAVENOUS

## 2024-05-15 MED ORDER — FENTANYL CITRATE (PF) 100 MCG/2ML IJ SOLN: 25.0000 ug | INTRAMUSCULAR | Status: DC | PRN

## 2024-05-15 MED ORDER — ONDANSETRON HCL 4 MG/2ML IJ SOLN: 4.0000 mg | Freq: Once | INTRAMUSCULAR | Status: DC | PRN

## 2024-05-15 NOTE — Progress Notes (Signed)
*  PRELIMINARY RESULTS* Echocardiogram Echocardiogram Transesophageal has been performed.  Sylvia Wiggins 05/15/2024, 1:33 PM

## 2024-05-15 NOTE — Anesthesia Postprocedure Evaluation (Signed)
 Anesthesia Post Note  Patient: Sylvia Wiggins  Procedure(s) Performed: ECHOCARDIOGRAM, TRANSESOPHAGEAL  Patient location during evaluation: Phase II Anesthesia Type: General Level of consciousness: awake and awake and alert Pain management: satisfactory to patient Vital Signs Assessment: post-procedure vital signs reviewed and stable Respiratory status: spontaneous breathing Cardiovascular status: blood pressure returned to baseline Anesthetic complications: no   No notable events documented.   Last Vitals:  Vitals:   05/15/24 1335 05/15/24 1345  BP: 121/82 138/86  Pulse: 100 94  Resp: 15 16  Temp: 36.7 C   SpO2: 100% 100%    Last Pain:  Vitals:   05/15/24 1335  TempSrc: Temporal  PainSc:                  VAN STAVEREN,Kaleena Corrow

## 2024-05-15 NOTE — Transfer of Care (Signed)
 Immediate Anesthesia Transfer of Care Note  Patient: Sylvia Wiggins  Procedure(s) Performed: ECHOCARDIOGRAM, TRANSESOPHAGEAL  Patient Location: PACU and Cath Lab  Anesthesia Type:MAC  Level of Consciousness: drowsy  Airway & Oxygen Therapy: Patient Spontanous Breathing and Patient connected to nasal cannula oxygen  Post-op Assessment: Report given to RN and Post -op Vital signs reviewed and stable  Post vital signs: Reviewed and stable  Last Vitals:  Vitals Value Taken Time  BP 100/65   Temp    Pulse 105   Resp 16   SpO2 96     Last Pain:  Vitals:   05/15/24 1225  TempSrc: Oral  PainSc: 0-No pain         Complications: No notable events documented.

## 2024-05-15 NOTE — H&P (Signed)
 [facility] CLINIC CARDIOLOGY HISTORY & PHYSICAL        Patient ID: Sylvia Wiggins MRN: [id number] DOB/AGE: April 13, 1961 63 y.o.  Outpatient TEE: 05/15/2024  HPI: Sylvia Wiggins is a 63 y.o. female  with a past medical history of coronary artery disease s/p multiple stents. Hx NSTEMI (2014), STEMI (07/2021) s/p stent to mid LCx, hypertension, hyperlipidemia, tobacco use, chronic back pain, recent admitted on 08/03 for acute ischemic stroke who presented on 05/15/2024 for outpatient TEE for further evaluation of acute ischemic stroke requested by Neurology. Plan for TEE this afternoon with Dr. Wilburn. Patient is agreeable. Consent has been obtained. Patient has remained NPO.  Patient denies any chest pain, palpitations, SOB, lower extremity swelling or lightheadedness. Patient states she feels well today.    Review of systems complete and found to be negative unless listed above    Past Medical History:  Diagnosis Date   Abscess, gluteal, left    Recurrent drainage, followed by [facility].   Arthritis    CAD (coronary artery disease)    Stents in Clogged artery at [facility] June 2014   Depression    Elevated lipids    Hyperlipidemia    Hypertension    Myocardial infarction (HCC) 03/2013   Stroke (HCC) 2025    Past Surgical History:  Procedure Laterality Date   CARDIAC CATHETERIZATION N/A 08/30/2016   Procedure: Left Heart Cath and Coronary Angiography;  Surgeon: Marsa Dooms, MD;  Location: [facility] INVASIVE CV LAB;  Service: Cardiovascular;  Laterality: N/A;   CARDIAC SURGERY  03/30/2013   stent placement   CORONARY ANGIOPLASTY     cardiac stents x 2   CORONARY STENT INTERVENTION N/A 08/23/2020   Procedure: CORONARY STENT INTERVENTION;  Surgeon: Florencio Cara BIRCH, MD;  Location: [facility] INVASIVE CV LAB;  Service: Cardiovascular;  Laterality: N/A;   CORONARY/GRAFT ACUTE MI REVASCULARIZATION N/A 07/30/2021   Procedure: Coronary/Graft Acute MI Revascularization;  Surgeon: Lawyer Bernardino Cough,  MD;  Location: [facility] INVASIVE CV LAB;  Service: Cardiovascular;  Laterality: N/A;   INGUINAL HERNIA REPAIR Right 02/26/2020   Procedure: HERNIA REPAIR INGUINAL ADULT;  Surgeon: Tye Millet, DO;  Location: [facility] ORS;  Service: General;  Laterality: Right;   LEFT HEART CATH AND CORONARY ANGIOGRAPHY N/A 08/23/2020   Procedure: LEFT HEART CATH AND CORONARY ANGIOGRAPHY and possible PCI and stent;  Surgeon: Florencio Cara BIRCH, MD;  Location: [facility] INVASIVE CV LAB;  Service: Cardiovascular;  Laterality: N/A;   LEFT HEART CATH AND CORONARY ANGIOGRAPHY N/A 07/30/2021   Procedure: LEFT HEART CATH AND CORONARY ANGIOGRAPHY;  Surgeon: Lawyer Bernardino Cough, MD;  Location: [facility] INVASIVE CV LAB;  Service: Cardiovascular;  Laterality: N/A;   LEFT HEART CATH AND CORS/GRAFTS ANGIOGRAPHY N/A 02/18/2018   Procedure: LEFT HEART CATH AND CORS/GRAFTS ANGIOGRAPHY;  Surgeon: Bosie Vinie LABOR, MD;  Location: [facility] INVASIVE CV LAB;  Service: Cardiovascular;  Laterality: N/A;   RECTAL SURGERY  Anal Fissure   XI ROBOTIC ASSISTED INGUINAL HERNIA REPAIR WITH MESH Right 05/16/2019   Procedure: XI ROBOTIC ASSISTED Right INGUINAL HERNIA REPAIR WITH MESH;  Surgeon: Tye Millet, DO;  Location: [facility] ORS;  Service: General;  Laterality: Right;    Medications Prior to Admission  Medication Sig Dispense Refill Last Dose/Taking   aspirin  EC 81 MG EC tablet Take 1 tablet (81 mg total) by mouth daily. Swallow whole. 30 tablet 11 Past Month   clopidogrel  (PLAVIX ) 75 MG tablet Take 1 tablet (75 mg total) by mouth daily for 21 days. 21 tablet 0 Past Week  isosorbide  mononitrate (IMDUR ) 30 MG 24 hr tablet Take 1 tablet (30 mg total) by mouth daily. 30 tablet 3 Past Week   metoprolol  succinate (TOPROL -XL) 25 MG 24 hr tablet Take 1 tablet (25 mg total) by mouth daily with breakfast. Take with or immediately following a meal. 30 tablet 3 Past Week   senna-docusate (SENOKOT-S) 8.6-50 MG tablet Take 1 tablet by mouth at bedtime as needed for mild  constipation. 30 tablet 0 Past Week   atorvastatin  (LIPITOR ) 80 MG tablet Take 1 tablet (80 mg total) by mouth daily. (Patient not taking: Reported on 05/14/2024) 30 tablet 3 Not Taking   losartan  (COZAAR ) 25 MG tablet Take 1 tablet (25 mg total) by mouth daily. (Patient not taking: Reported on 05/14/2024) 30 tablet 3 Not Taking   nitroGLYCERIN  (NITROSTAT ) 0.4 MG SL tablet Place 1 tablet (0.4 mg total) under the tongue every 5 (five) minutes x 3 doses as needed for chest pain. (Patient not taking: Reported on 05/14/2024) 30 tablet 0 Not Taking   Social History   Socioeconomic History   Marital status: Legally Separated    Spouse name: Not on file   Number of children: Not on file   Years of education: Not on file   Highest education level: Not on file  Occupational History   Not on file  Tobacco Use   Smoking status: Every Day    Current packs/day: 0.25    Types: Cigarettes   Smokeless tobacco: Never  Vaping Use   Vaping status: Never Used  Substance and Sexual Activity   Alcohol use: No    Alcohol/week: 0.0 standard drinks of alcohol   Drug use: No   Sexual activity: Never  Other Topics Concern   Not on file  Social History Narrative   Not on file   Social Drivers of Health   Financial Resource Strain: Not on file  Food Insecurity: No Food Insecurity (05/11/2024)   Hunger Vital Sign    Worried About Running Out of Food in the Last Year: Never true    Ran Out of Food in the Last Year: Never true  Transportation Needs: No Transportation Needs (05/11/2024)   PRAPARE - Administrator, Civil Service (Medical): No    Lack of Transportation (Non-Medical): No  Physical Activity: Not on file  Stress: Not on file  Social Connections: Not on file  Intimate Partner Violence: Not At Risk (05/11/2024)   Humiliation, Afraid, Rape, and Kick questionnaire    Fear of Current or Ex-Partner: No    Emotionally Abused: No    Physically Abused: No    Sexually Abused: No    Family  History  Problem Relation Age of Onset   Cancer Mother        colon   Heart disease Father        Died of Heart Attack     Vitals:   05/15/24 1225  BP: (!) 157/94  Pulse: (!) 108  Resp: 17  Temp: 97.9 F (36.6 C)  TempSrc: Oral  SpO2: 100%    PHYSICAL EXAM General: chronically ill appearing female, well nourished, in no acute distress. HEENT: Normocephalic and atraumatic. Neck: No JVD.   Lungs: Normal respiratory effort on room air. Clear bilaterally to auscultation. No wheezes, crackles, rhonchi.  Heart: HRRR. Normal S1 and S2 without gallops or murmurs.  Abdomen: Non-distended appearing.  Msk: Normal strength and tone for age. Extremities: Warm and well perfused. No clubbing, cyanosis, edema.  Neuro: Alert and oriented  X 3. Psych: Answers questions appropriately.   Labs: Basic Metabolic Panel: Recent Labs    05/13/24 0345  NA 137  K 3.7  CL 102  CO2 27  GLUCOSE 91  BUN 17  CREATININE 0.90  CALCIUM  9.6   Liver Function Tests: No results for input(s): AST, ALT, ALKPHOS, BILITOT, PROT, ALBUMIN in the last 72 hours. No results for input(s): LIPASE, AMYLASE in the last 72 hours. CBC: Recent Labs    05/12/24 1714 05/13/24 0345  WBC 7.8 6.1  NEUTROABS 5.5 3.5  HGB 14.5 14.0  HCT 41.1 41.5  MCV 98.6 99.8  PLT 245 215   Cardiac Enzymes: No results for input(s): CKTOTAL, CKMB, CKMBINDEX, TROPONINIHS in the last 72 hours. BNP: No results for input(s): BNP in the last 72 hours. D-Dimer: No results for input(s): DDIMER in the last 72 hours. Hemoglobin A1C: No results for input(s): HGBA1C in the last 72 hours. Fasting Lipid Panel: No results for input(s): CHOL, HDL, LDLCALC, TRIG, CHOLHDL, LDLDIRECT in the last 72 hours. Thyroid Function Tests: No results for input(s): TSH, T4TOTAL, T3FREE, THYROIDAB in the last 72 hours.  Invalid input(s): FREET3 Anemia Panel: No results for input(s): VITAMINB12,  FOLATE, FERRITIN, TIBC, IRON, RETICCTPCT in the last 72 hours.   Radiology: ECHOCARDIOGRAM COMPLETE Result Date: 05/12/2024    ECHOCARDIOGRAM REPORT   Patient Name:   Sylvia Wiggins Date of Exam: 05/12/2024 Medical Rec #:  [id number]      Height:       62.0 in Accession #:    [id number]     Weight:       136.7 lb Date of Birth:  [birth year]      BSA:          1.626 m Patient Age:    63 years       BP:           137/68 mmHg Patient Gender: F              HR:           68 bpm. Exam Location:  [facility] Procedure: 2D Echo, Cardiac Doppler, Color Doppler and Saline Contrast Bubble            Study (Both Spectral and Color Flow Doppler were utilized during            procedure). Indications:     Stroke I63.9  History:         Patient has prior history of Echocardiogram examinations, most                  recent 07/31/2021. Previous Myocardial Infarction and CAD; Risk                  Factors:Hypertension.  Sonographer:     Christopher Furnace Referring Phys:  [id number] KELLY A GRIFFITH Diagnosing Phys: Cara JONETTA Lovelace MD  Sonographer Comments: Suboptimal parasternal window. IMPRESSIONS  1. Left ventricular ejection fraction, by estimation, is 50 to 55%. The left ventricle has low normal function. The left ventricle has no regional wall motion abnormalities. There is mild concentric left ventricular hypertrophy. Left ventricular diastolic parameters are consistent with Grade I diastolic dysfunction (impaired relaxation).  2. Right ventricular systolic function is normal. The right ventricular size is normal. Moderately increased right ventricular wall thickness.  3. The mitral valve is normal in structure. Trivial mitral valve regurgitation.  4. The aortic valve is normal in structure. Aortic valve regurgitation is not visualized. FINDINGS  Left Ventricle:  Left ventricular ejection fraction, by estimation, is 50 to 55%. The left ventricle has low normal function. The left ventricle has no regional wall motion abnormalities.  Strain was performed and the global longitudinal strain is indeterminate. The left ventricular internal cavity size was normal in size. There is mild concentric left ventricular hypertrophy. Left ventricular diastolic parameters are consistent with Grade I diastolic dysfunction (impaired relaxation). Right Ventricle: The right ventricular size is normal. Moderately increased right ventricular wall thickness. Right ventricular systolic function is normal. Left Atrium: Left atrial size was normal in size. Right Atrium: Right atrial size was normal in size. Pericardium: There is no evidence of pericardial effusion. Mitral Valve: The mitral valve is normal in structure. Trivial mitral valve regurgitation. Tricuspid Valve: The tricuspid valve is normal in structure. Tricuspid valve regurgitation is mild. Aortic Valve: The aortic valve is normal in structure. Aortic valve regurgitation is not visualized. Aortic valve mean gradient measures 3.0 mmHg. Aortic valve peak gradient measures 4.8 mmHg. Pulmonic Valve: The pulmonic valve was normal in structure. Pulmonic valve regurgitation is not visualized. Aorta: The ascending aorta was not well visualized. IAS/Shunts: No atrial level shunt detected by color flow Doppler. Agitated saline contrast was given intravenously to evaluate for intracardiac shunting. Additional Comments: 3D was performed not requiring image post processing on an independent workstation and was indeterminate.  LEFT VENTRICLE PLAX 2D LVIDd:         4.70 cm LVIDs:         3.40 cm LV PW:         1.00 cm LV IVS:        1.30 cm  RIGHT VENTRICLE RV Basal diam:  2.60 cm RV Mid diam:    2.60 cm LEFT ATRIUM           Index       RIGHT ATRIUM           Index LA diam:      1.80 cm 1.11 cm/m  RA Area:     10.20 cm LA Vol (A2C): 15.2 ml 9.35 ml/m  RA Volume:   23.00 ml  14.14 ml/m LA Vol (A4C): 12.6 ml 7.75 ml/m  AORTIC VALVE AV Vmax:           110.00 cm/s AV Vmean:          78.700 cm/s AV VTI:            0.199  m AV Peak Grad:      4.8 mmHg AV Mean Grad:      3.0 mmHg LVOT Vmax:         64.70 cm/s LVOT Vmean:        47.900 cm/s LVOT VTI:          0.134 m LVOT/AV VTI ratio: 0.67  AORTA Ao Root diam: 3.50 cm MITRAL VALVE               TRICUSPID VALVE MV Area (PHT): 3.79 cm    TR Peak grad:   18.1 mmHg MV Decel Time: 200 msec    TR Vmax:        213.00 cm/s MV E velocity: 57.60 cm/s MV A velocity: 92.80 cm/s  SHUNTS MV E/A ratio:  0.62        Systemic VTI: 0.13 m Cara JONETTA Lovelace MD Electronically signed by Cara JONETTA Lovelace MD Signature Date/Time: 05/12/2024/11:59:51 AM    Final    MR BRAIN WO CONTRAST Result Date: 05/11/2024 EXAM: MRI BRAIN WITHOUT CONTRAST  05/11/2024 10:00:06 PM TECHNIQUE: Multiplanar multisequence MRI of the head/brain was performed without the administration of intravenous contrast. COMPARISON: 06/02/2016 CLINICAL HISTORY: Stroke, follow up. PT comes in via pov with with complaints of confusion and difficulty forming words. Pt's last known well was 3 days ago. Pt with left sided weakness and left sided facial droop, and reports slurred speech, and staggering 3 days ago. Pt with no complaints of pain at this time. Pt is alert and oriented x2. FINDINGS: BRAIN AND VENTRICLES: Intermediate sized acute infarct of the left frontal lobe. Punctate foci of acute ischemia within both occipital lobes. Multifocal hyperintense T2-weighted signal within the cerebral white matter, most commonly due to chronic small vessel disease. Mild generalized volume loss. No intracranial hemorrhage. No mass. No midline shift. No hydrocephalus. The sella is unremarkable. Normal flow voids. ORBITS: No acute abnormality. SINUSES AND MASTOIDS: No acute abnormality. BONES AND SOFT TISSUES: Normal marrow signal. No acute soft tissue abnormality. IMPRESSION: 1. Intermediate sized acute infarct of the left frontal lobe and punctate foci of acute ischemia within both occipital lobes. 2. Multifocal hyperintense T2-weighted signal within the  cerebral white matter, most commonly due to chronic small vessel disease. 3. Mild generalized volume loss. Electronically signed by: Franky Stanford MD 05/11/2024 10:05 PM EDT RP Workstation: [id number]   DG Abd Portable 1V Result Date: 05/11/2024 CLINICAL DATA:  Stroke EXAM: PORTABLE ABDOMEN - 1 VIEW COMPARISON:  CT 03/29/2013 FINDINGS: Contrast within the renal collecting systems and bladder. Nonobstructed bowel-gas pattern. No unexpected metallic foreign object is seen. IMPRESSION: Nonobstructed bowel-gas pattern. Negative for unexpected metallic foreign object over the included abdomen and pelvis. Electronically Signed   By: Luke Bun M.D.   On: 05/11/2024 21:47   DG Chest Port 1 View Result Date: 05/11/2024 CLINICAL DATA:  Stroke follow-up EXAM: PORTABLE CHEST 1 VIEW COMPARISON:  07/30/2021 FINDINGS: No acute airspace disease or effusion. Normal cardiomediastinal silhouette with aortic atherosclerosis. Coronary stents are noted. Metallic snaps over the supraclavicular soft tissues. Aortic atherosclerosis. IMPRESSION: No active disease. Chronic coronary stents. No unexpected metallic density foreign bodies. Electronically Signed   By: Luke Bun M.D.   On: 05/11/2024 21:45   CT ANGIO HEAD NECK W WO CM Result Date: 05/11/2024 EXAM: CTA HEAD AND NECK WITH AND WITHOUT 05/11/2024 08:25:59 PM TECHNIQUE: CTA of the head and neck was performed with and without the administration of intravenous contrast. Multiplanar 2D and/or 3D reformatted images are provided for review. Automated exposure control, iterative reconstruction, and/or weight based adjustment of the mA/kV was utilized to reduce the radiation dose to as low as reasonably achievable. Stenosis of the internal carotid arteries measured using NASCET criteria. COMPARISON: None available CLINICAL HISTORY: Stroke/TIA, determine embolic source. PT comes in via pov with complaints of confusion and difficulty forming words. Pt's last known well was 3 days  ago. Pt with left sided weakness and left sided facial droop, and reports slurred speech, and staggering 3 days ago. Pt with no complaints of pain at this time. Pt is alert and oriented x2. FINDINGS: CTA NECK: AORTIC ARCH AND ARCH VESSELS: Calcific aortic atherosclerosis. CERVICAL CAROTID ARTERIES: Mixed density atherosclerosis at the right carotid bifurcation extending into the ICA without hemodynamically significant stenosis. Moderate calcific atherosclerosis of the left carotid bifurcation with multifocal atherosclerotic calcifications in the ICA but no hemodynamically significant stenosis. CERVICAL VERTEBRAL ARTERIES: Mild atherosclerotic calcification of both vertebral artery V4 segments without significant stenosis. LUNGS AND MEDIASTINUM: Unremarkable. SOFT TISSUES: No acute abnormality. BONES: No acute abnormality. 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 There is mild atherosclerotic calcification of both ICA cavernous segments without significant stenosis. No stenosis of the middle or anterior cerebral arteries. POSTERIOR CIRCULATION: No significant stenosis of the posterior cerebral arteries. No significant stenosis of the basilar artery. No significant stenosis of the vertebral arteries. No aneurysm. OTHER: No dural venous sinus thrombosis on this non-dedicated study. IMPRESSION: 1. No large vessel occlusion or hemodynamically significant stenosis in the head or neck. 2. 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 Electronically signed by: Franky Stanford MD 05/11/2024 08:55 PM EDT RP Workstation: [id number]   CT HEAD WO CONTRAST Result Date: 05/11/2024 CLINICAL DATA:  Facial droop confusion EXAM: CT HEAD WITHOUT CONTRAST TECHNIQUE: Contiguous axial images were obtained from the base of the skull through the vertex without intravenous contrast. RADIATION DOSE  REDUCTION: This exam was performed according to the departmental dose-optimization program which includes automated exposure control, adjustment of the mA and/or kV according to patient size and/or use of iterative reconstruction technique. COMPARISON:  CT brain 01/28/2019, MRI 06/02/2016 FINDINGS: Brain: Focal hypodensity and loss of gray-white matter differentiation in the left frontal lobe consistent with acute or subacute infarct. No hemorrhage. No significant mass effect or midline shift. Atrophy and chronic small vessel ischemic changes of the white matter. Slight interval ventricular enlargement compared to prior head CT, likely due to atrophy progression. Vascular: No hyperdense vessels.  No unexpected calcification Skull: Normal. Negative for fracture or focal lesion. Sinuses/Orbits: No acute finding. Other: None IMPRESSION: 1. Focal hypodensity and loss of gray-white matter differentiation in the left frontal lobe consistent with acute or subacute infarct. No hemorrhage. No midline shift or significant mass effect 2. Atrophy and chronic small vessel ischemic changes of the white matter. Electronically Signed   By: Luke Bun M.D.   On: 05/11/2024 18:50   Most recent LHC (07/30/2021)    Mid LAD to Dist LAD lesion is 75% stenosed.    Ost LAD lesion is 50% stenosed.    Dist RCA lesion is 50% stenosed.    Ost 3rd Mrg to 3rd Mrg lesion is 90% stenosed.    RPAV lesion is 100% stenosed.    RPDA lesion is 95% stenosed.    Ost Cx to Prox Cx lesion is 100% stenosed.    Non-stenotic Prox LAD lesion was previously treated.    Non-stenotic Prox Cx to Dist Cx lesion was previously treated.    Non-stenotic Prox RCA to Mid RCA lesion was previously treated.    A drug-eluting stent was successfully placed using a STENT ONYX FRONTIER 2.75X26.    Post intervention, there is a 0% residual stenosis.    LV end diastolic pressure is severely elevated.    There is no aortic valve stenosis.    ECHO as above  TELEMETRY reviewed by me 05/15/2024: sinus rhythm, rate 90s  ASSESSMENT AND PLAN:  Sylvia Wiggins is a 63 y.o. female  with a past medical history of coronary artery disease s/p multiple stents. Hx NSTEMI (2014), STEMI (07/2021) s/p stent to mid LCx, hypertension, hyperlipidemia, tobacco use, chronic back pain, recent admitted on 08/03 for acute ischemic stroke who presented on 05/15/2024 for outpatient TEE for further evaluation of acute ischemic stroke requested by Neurology. Plan for TEE this afternoon with Dr. Wilburn. Patient is agreeable. Consent has been obtained. Patient has remained NPO.  # Recent acute ischemic stroke (08/03) -  left frontal lobe on CT head  Intermediate sized acute infarct of the left frontal lobe and punctate foci of acute ischemia within both occipital lobes  -Continue home aspirin , plavix , and statin.  -Long-term cardiac monitor placed on 08/05 and patient received instructions at that time. Patient arrived today without cardiac monitor in place. Patient states she keeps forgetting that she has to leave it on. Re-placed cardiac monitor and re-discussed instructions. Patient expresses understanding. To be removed by patient on 09/05.  -Plan for outpatient TEE this afternoon (08/07) with Dr. Wilburn.  -Discussed the risks and benefits of proceeding with TEE for further evaluation. She has remained NPO. Consent obtained.    # CAD s/p multiple stents  # Hypertension  # Hyperlipidemia  -Continue home ASA, Lipitor , metoprolol , Imdur , losartan .   This patient's plan of care was discussed and created with Dr. Wilburn and he is in agreement.  Signed: Dorene Comfort, PA-C  05/15/2024, 12:40 PM [facility] Cardiology

## 2024-05-15 NOTE — Anesthesia Preprocedure Evaluation (Signed)
 Anesthesia Evaluation  Patient identified by MRN, date of birth, ID band Patient awake    Reviewed: Allergy & Precautions, NPO status , Patient's Chart, lab work & pertinent test results  Airway Mallampati: II  TM Distance: >3 FB Neck ROM: full    Dental  (+) Teeth Intact   Pulmonary neg pulmonary ROS, COPD, Current Smoker   Pulmonary exam normal  + decreased breath sounds      Cardiovascular Exercise Tolerance: Good hypertension, Pt. on medications + CAD and + Past MI  negative cardio ROS Normal cardiovascular exam Rhythm:Regular Rate:Normal     Neuro/Psych    Depression    CVA negative neurological ROS  negative psych ROS   GI/Hepatic negative GI ROS, Neg liver ROS,,,  Endo/Other  negative endocrine ROS    Renal/GU negative Renal ROS  negative genitourinary   Musculoskeletal   Abdominal Normal abdominal exam  (+)   Peds negative pediatric ROS (+)  Hematology negative hematology ROS (+)   Anesthesia Other Findings Past Medical History: No date: Abscess, gluteal, left     Comment:  Recurrent drainage, followed by [facility]. No date: Arthritis No date: CAD (coronary artery disease)     Comment:  Stents in Clogged artery at [facility] June 2014 No date: Depression No date: Elevated lipids No date: Hyperlipidemia No date: Hypertension 03/2013: Myocardial infarction [facility]) 2025: Stroke [facility])  Past Surgical History: 08/30/2016: CARDIAC CATHETERIZATION; N/A     Comment:  Procedure: Left Heart Cath and Coronary Angiography;                Surgeon: Marsa Dooms, MD;  Location: [facility]               INVASIVE CV LAB;  Service: Cardiovascular;  Laterality:               N/A; 03/30/2013: CARDIAC SURGERY     Comment:  stent placement No date: CORONARY ANGIOPLASTY     Comment:  cardiac stents x 2 08/23/2020: CORONARY STENT INTERVENTION; N/A     Comment:  Procedure: CORONARY STENT INTERVENTION;  Surgeon:                Florencio Cara BIRCH, MD;  Location: [facility] INVASIVE CV LAB;               Service: Cardiovascular;  Laterality: N/A; 07/30/2021: CORONARY/GRAFT ACUTE MI REVASCULARIZATION; N/A     Comment:  Procedure: Coronary/Graft Acute MI Revascularization;                Surgeon: Lawyer Bernardino Cough, MD;  Location: [facility]               INVASIVE CV LAB;  Service: Cardiovascular;  Laterality:               N/A; 02/26/2020: INGUINAL HERNIA REPAIR; Right     Comment:  Procedure: HERNIA REPAIR INGUINAL ADULT;  Surgeon:               Tye Millet, DO;  Location: [facility] ORS;  Service: General;              Laterality: Right; 08/23/2020: LEFT HEART CATH AND CORONARY ANGIOGRAPHY; N/A     Comment:  Procedure: LEFT HEART CATH AND CORONARY ANGIOGRAPHY and               possible PCI and stent;  Surgeon: Florencio Cara BIRCH, MD;              Location: [facility] INVASIVE CV LAB;  Service: Cardiovascular;              Laterality: N/A; 07/30/2021: LEFT HEART CATH AND CORONARY ANGIOGRAPHY; N/A     Comment:  Procedure: LEFT HEART CATH AND CORONARY ANGIOGRAPHY;                Surgeon: Lawyer Bernardino Cough, MD;  Location: [facility]               INVASIVE CV LAB;  Service: Cardiovascular;  Laterality:               N/A; 02/18/2018: LEFT HEART CATH AND CORS/GRAFTS ANGIOGRAPHY; N/A     Comment:  Procedure: LEFT HEART CATH AND CORS/GRAFTS ANGIOGRAPHY;               Surgeon: Bosie Vinie LABOR, MD;  Location: [facility] INVASIVE CV              LAB;  Service: Cardiovascular;  Laterality: N/A; Anal Fissure: RECTAL SURGERY 05/16/2019: XI ROBOTIC ASSISTED INGUINAL HERNIA REPAIR WITH MESH; Right     Comment:  Procedure: XI ROBOTIC ASSISTED Right INGUINAL HERNIA               REPAIR WITH MESH;  Surgeon: Tye Millet, DO;  Location:               [facility] ORS;  Service: General;  Laterality: Right;     Reproductive/Obstetrics negative OB ROS                              Anesthesia Physical Anesthesia Plan  ASA: 3  Anesthesia Plan:  General   Post-op Pain Management:    Induction: Intravenous  PONV Risk Score and Plan: Propofol  infusion and TIVA  Airway Management Planned: Natural Airway and Nasal Cannula  Additional Equipment:   Intra-op Plan:   Post-operative Plan:   Informed Consent: I have reviewed the patients History and Physical, chart, labs and discussed the procedure including the risks, benefits and alternatives for the proposed anesthesia with the patient or authorized representative who has indicated his/her understanding and acceptance.     Dental Advisory Given  Plan Discussed with: CRNA  Anesthesia Plan Comments:         Anesthesia Quick Evaluation

## 2024-05-16 ENCOUNTER — Encounter: Payer: Self-pay | Admitting: Cardiology

## 2024-06-14 ENCOUNTER — Emergency Department

## 2024-06-14 ENCOUNTER — Emergency Department
Admission: EM | Admit: 2024-06-14 | Discharge: 2024-06-14 | Disposition: A | Discharge status: Home / Self Care | Attending: Emergency Medicine | Admitting: Emergency Medicine

## 2024-06-14 ENCOUNTER — Other Ambulatory Visit: Payer: Self-pay

## 2024-06-14 DIAGNOSIS — R0789 Other chest pain: Secondary | ICD-10-CM | POA: Diagnosis not present

## 2024-06-14 DIAGNOSIS — R079 Chest pain, unspecified: Secondary | ICD-10-CM | POA: Diagnosis present

## 2024-06-14 DIAGNOSIS — Z8673 Personal history of transient ischemic attack (TIA), and cerebral infarction without residual deficits: Secondary | ICD-10-CM | POA: Insufficient documentation

## 2024-06-14 DIAGNOSIS — R0602 Shortness of breath: Secondary | ICD-10-CM | POA: Diagnosis not present

## 2024-06-14 DIAGNOSIS — I2510 Atherosclerotic heart disease of native coronary artery without angina pectoris: Secondary | ICD-10-CM | POA: Diagnosis not present

## 2024-06-14 DIAGNOSIS — M546 Pain in thoracic spine: Secondary | ICD-10-CM | POA: Diagnosis not present

## 2024-06-14 DIAGNOSIS — R7989 Other specified abnormal findings of blood chemistry: Secondary | ICD-10-CM | POA: Insufficient documentation

## 2024-06-14 LAB — BASIC METABOLIC PANEL WITH GFR
Anion gap: 11 (ref 5–15)
BUN: 16 mg/dL (ref 8–23)
CO2: 23 mmol/L (ref 22–32)
Calcium: 9.9 mg/dL (ref 8.9–10.3)
Chloride: 103 mmol/L (ref 98–111)
Creatinine, Ser: 1.12 mg/dL — ABNORMAL HIGH (ref 0.44–1.00)
GFR, Estimated: 55 mL/min — ABNORMAL LOW (ref >60)
Glucose, Bld: 158 mg/dL — ABNORMAL HIGH (ref 70–99)
Potassium: 3.5 mmol/L (ref 3.5–5.1)
Sodium: 137 mmol/L (ref 135–145)

## 2024-06-14 LAB — CBC
HCT: 44.6 % (ref 36.0–46.0)
Hemoglobin: 15.1 g/dL — ABNORMAL HIGH (ref 12.0–15.0)
MCH: 33.5 pg (ref 26.0–34.0)
MCHC: 33.9 g/dL (ref 30.0–36.0)
MCV: 98.9 fL (ref 80.0–100.0)
Platelets: 229 K/uL (ref 150–400)
RBC: 4.51 MIL/uL (ref 3.87–5.11)
RDW: 12.3 % (ref 11.5–15.5)
WBC: 8.3 K/uL (ref 4.0–10.5)
nRBC: 0 % (ref 0.0–0.2)

## 2024-06-14 LAB — TROPONIN I (HIGH SENSITIVITY)
Troponin I (High Sensitivity): 11 ng/L (ref <18)
Troponin I (High Sensitivity): 24 ng/L — ABNORMAL HIGH (ref <18)
Troponin I (High Sensitivity): 38 ng/L — ABNORMAL HIGH (ref <18)

## 2024-06-14 MED ORDER — IOHEXOL 350 MG/ML SOLN
75.0000 mL | Freq: Once | INTRAVENOUS | Status: AC | PRN
Start: 2024-06-14 — End: 2024-06-14
  Administered 2024-06-14: 75 mL via INTRAVENOUS

## 2024-06-14 NOTE — Discharge Instructions (Signed)
 Otherwise keep your regularly scheduled appointment on 06/24/2024.  As we discussed, your evaluation was generally reassuring, except for your slightly elevated troponin level.  We offered to admission to the hospital but you would prefer not to stay which is reasonable, but please return immediately to the emergency department if you develop new or worsening symptoms that concern you.  Please follow-up with your heart doctor (cardiologist) at the next available opportunity or as scheduled on September 16.

## 2024-06-14 NOTE — ED Notes (Signed)
 Attempted to call pt's friends twice in ref to discharge. The pt was wheeled to the lobby. Katheren the first nurse was made aware and advised she would attempt to call her friends. The pt was transported to the lobby without incident.

## 2024-06-14 NOTE — ED Triage Notes (Signed)
 Pt arrives via [facility] from home. Called out for Chest pain, c/o shortness of breath and back pain. 12 lead unremarkable, hx of MI and anxiety. HR 67, 100 on 2 liters, at baseline her saturations were normal, just placed for comfort. 150/75, cbg 93.

## 2024-06-14 NOTE — ED Provider Notes (Signed)
 [facility] Provider Note    Event Date/Time   First MD Initiated Contact with Patient 06/14/24 0401     (approximate)   History   Shortness of Breath   HPI Sylvia Wiggins is a 63 y.o. female who reports a prior history of includes CVA, CAD, panic attacks (self diagnosed), and prior heart attack.  She presents by EMS for evaluation of shortness of breath and pain in her upper back.  Initial chief complaint was also for chest pain but she says that her chest never really hurt, she just felt really short of breath like she could not catch her air.  She said that she feels better after coming to the emergency department.  She says she was pouring sweat earlier when it happened but now she is not.  No nausea or vomiting.  No abdominal pain.     Physical Exam   Triage Vital Signs: ED Triage Vitals  Encounter Vitals Group     BP 06/14/24 0111 137/75     Girls Systolic BP Percentile --      Girls Diastolic BP Percentile --      Boys Systolic BP Percentile --      Boys Diastolic BP Percentile --      Pulse Rate 06/14/24 0111 70     Resp 06/14/24 0111 16     Temp 06/14/24 0111 98.3 F (36.8 C)     Temp Source 06/14/24 0111 Oral     SpO2 06/14/24 0111 100 %     Weight 06/14/24 0111 59 kg (130 lb)     Height 06/14/24 0111 1.575 m (5' 2)     Head Circumference --      Peak Flow --      Pain Score 06/14/24 0118 0     Pain Loc --      Pain Education --      Exclude from Growth Chart --     Most recent vital signs: Vitals:   06/14/24 0600 06/14/24 0630  BP: 127/80 124/76  Pulse: 61 62  Resp: 10 10  Temp:    SpO2: 100% 100%    General: Awake, no distress.  CV:  Good peripheral perfusion.  Regular rate and rhythm, normal heart sounds. Resp:  Normal effort. Speaking easily and comfortably, no accessory muscle usage nor intercostal retractions.  Lungs are clear to auscultation with no wheezing. Abd:  No distention.  No tenderness to palpation of the  abdomen.   ED Results / Procedures / Treatments   Labs (all labs ordered are listed, but only abnormal results are displayed) Labs Reviewed  BASIC METABOLIC PANEL WITH GFR - Abnormal; Notable for the following components:      Result Value   Glucose, Bld 158 (*)    Creatinine, Ser 1.12 (*)    GFR, Estimated 55 (*)    All other components within normal limits  CBC - Abnormal; Notable for the following components:   Hemoglobin 15.1 (*)    All other components within normal limits  TROPONIN I (HIGH SENSITIVITY) - Abnormal; Notable for the following components:   Troponin I (High Sensitivity) 24 (*)    All other components within normal limits  TROPONIN I (HIGH SENSITIVITY) - Abnormal; Notable for the following components:   Troponin I (High Sensitivity) 38 (*)    All other components within normal limits  TROPONIN I (HIGH SENSITIVITY)     EKG  ED ECG REPORT #1 I, Darleene Dome, the  attending physician, personally viewed and interpreted this ECG.  Date: 06/14/2024 EKG Time: 1:15 AM Rate: 68 Rhythm: normal sinus rhythm, but several leads are suggestive of underlying a flutter QRS Axis: normal Intervals: normal ST/T Wave abnormalities: Non-specific ST segment / T-wave changes, but no clear evidence of acute ischemia. Narrative Interpretation: no definitive evidence of acute ischemia; does not meet STEMI criteria.  ED ECG REPORT #2 5:43 AM I, Darleene Dome, the attending physician, personally viewed and interpreted this ECG.  Date: 06/14/2024 EKG Time: 5:43 AM Rate: 64 Rhythm: normal sinus rhythm QRS Axis: normal Intervals: normal ST/T Wave abnormalities: Non-specific ST segment / T-wave changes, but no clear evidence of acute ischemia. Narrative Interpretation: no definitive evidence of acute ischemia; does not meet STEMI criteria.    RADIOLOGY I independently viewed and interpreted the patient's two-view chest x-ray and I see no evidence of pneumonia or pneumothorax  or widened mediastinum.  I also read the radiologist's report, which confirmed no acute findings.   PROCEDURES:  Critical Care performed: No  .1-3 Lead EKG Interpretation  Performed by: Dome Darleene, MD Authorized by: Dome Darleene, MD     Interpretation: normal     ECG rate:  70   ECG rate assessment: normal     Rhythm: sinus rhythm     Ectopy: none     Conduction: normal       IMPRESSION / MDM / ASSESSMENT AND PLAN / ED COURSE  I reviewed the triage vital signs and the nursing notes.                              Differential diagnosis includes, but is not limited to, anxiety, ACS, AAS, PE, pneumonia, pneumothorax.  Patient's presentation is most consistent with acute presentation with potential threat to life or bodily function.  Labs/studies ordered: EKG, two-view chest x-ray, CTA chest, CBC, high-sensitivity troponin x 3, basic metabolic panel  Interventions/Medications given:  Medications  iohexol  (OMNIPAQUE ) 350 MG/ML injection 75 mL (75 mLs Intravenous Contrast Given 06/14/24 0605)    (Note:  hospital course my include additional interventions and/or labs/studies not listed above.)   Vitals are stable and patient is currently asymptomatic.  However, the episode was worrisome particular given her history.  It would be easier to chalk up to anxiety if she did not have a troponin that went from 10 to 24.  Otherwise labs are generally reassuring, normal CBC, possibly a slight AKI basic metabolic panel but not substantial.  Chest x-ray is clear.  EKG will be repeated because while it is a regular rate there is some artifact that could actually be an underlying a flutter.  I will reassess with a second EKG.  I talked with the patient and we agreed to proceed with a CTA chest to rule out PE given some evidence of heart strain with the elevated troponin.  If she remains asymptomatic and the imaging and her third troponin are all essentially normal or unchanged, she may be  appropriate for discharge and outpatient follow-up.  The patient is on the cardiac monitor to evaluate for evidence of arrhythmia and/or significant heart rate changes.   Clinical Course as of 06/14/24 0710  Sat Jun 14, 2024  0703 CT Angio Chest PE W/Cm &/Or Wo Cm I independently viewed and interpreted the patient's CTA chest and I see no evidence of pulmonary embolism nor obvious aortic dissection.  No evidence of pneumonia in the patient's lungs.  I will reassess. [CF]  0704 Troponin I (High Sensitivity)(!): 38 [CF]  0704 Troponin I (High Sensitivity)(!) Of note, the patient's third troponin went up again of note, the patient's third troponin went up again, this time by 14 points.  I will discuss this with her. [CF]  0707  Patient continues to be asymptomatic and has been stable for more than 6 hours in the emergency department.  I explained that her troponin troponin keeps going up by a very small amount and that I cannot explain that and I offered to admit her to the hospital.  However she would much prefer to go home and says she already has an appointment with cardiology in about 10 days.  I encouraged her to call and see if there is a sooner appointment but also gave her strict return precautions should she develop any new or worsening symptoms.  I explained that while I do not think that her hesitation is consistent with a heart attack, I cannot prove at this point that she is NOT having a heart attack.  She said that she understands and still wants to go home and I think that is reasonable and appropriate.  I reiterated multiple times my strict return precautions. [CF]    Clinical Course User Index [CF] Gordan Huxley, MD     FINAL CLINICAL IMPRESSION(S) / ED DIAGNOSES   Final diagnoses:  Atypical chest pain  Elevated troponin level     Rx / DC Orders   ED Discharge Orders          Ordered    Ambulatory referral to Cardiology       Comments: If you have not heard from the  Cardiology office within the next 72 hours please call [phone].   06/14/24 0709             Note:  This document was prepared using Dragon voice recognition software and may include unintentional dictation errors.   Gordan Huxley, MD 06/14/24 [phone]

## 2024-06-24 ENCOUNTER — Emergency Department
Admission: EM | Admit: 2024-06-24 | Discharge: 2024-06-24 | Discharge status: Against Medical Advice | Attending: Emergency Medicine | Admitting: Emergency Medicine

## 2024-06-24 ENCOUNTER — Encounter (INDEPENDENT_AMBULATORY_CARE_PROVIDER_SITE_OTHER): Payer: Self-pay | Admitting: Vascular Surgery

## 2024-06-24 ENCOUNTER — Emergency Department

## 2024-06-24 ENCOUNTER — Other Ambulatory Visit: Payer: Self-pay

## 2024-06-24 DIAGNOSIS — Z5321 Procedure and treatment not carried out due to patient leaving prior to being seen by health care provider: Secondary | ICD-10-CM | POA: Diagnosis not present

## 2024-06-24 DIAGNOSIS — R110 Nausea: Secondary | ICD-10-CM | POA: Insufficient documentation

## 2024-06-24 DIAGNOSIS — F419 Anxiety disorder, unspecified: Secondary | ICD-10-CM | POA: Insufficient documentation

## 2024-06-24 DIAGNOSIS — R079 Chest pain, unspecified: Secondary | ICD-10-CM | POA: Diagnosis not present

## 2024-06-24 LAB — CBC
HCT: 51.3 % — ABNORMAL HIGH (ref 36.0–46.0)
Hemoglobin: 17.4 g/dL — ABNORMAL HIGH (ref 12.0–15.0)
MCH: 33.5 pg (ref 26.0–34.0)
MCHC: 33.9 g/dL (ref 30.0–36.0)
MCV: 98.7 fL (ref 80.0–100.0)
Platelets: 259 K/uL (ref 150–400)
RBC: 5.2 MIL/uL — ABNORMAL HIGH (ref 3.87–5.11)
RDW: 12.4 % (ref 11.5–15.5)
WBC: 11 K/uL — ABNORMAL HIGH (ref 4.0–10.5)
nRBC: 0 % (ref 0.0–0.2)

## 2024-06-24 LAB — TROPONIN I (HIGH SENSITIVITY): Troponin I (High Sensitivity): 45 ng/L — ABNORMAL HIGH (ref <18)

## 2024-06-24 MED ORDER — ONDANSETRON 4 MG PO TBDP
4.0000 mg | ORAL_TABLET | Freq: Once | ORAL | Status: AC
  Administered 2024-06-24: 4 mg via ORAL
  Filled 2024-06-24: qty 1

## 2024-06-24 NOTE — ED Triage Notes (Signed)
 Pt to ED via EMS from home, pt reports anxiety and chest pain that began earlier tonight. Pt was given 324 asa by EMS. Pt has hx 2 heart attacks. Pt also endorses some nausea.

## 2024-06-25 ENCOUNTER — Telehealth: Payer: Self-pay | Admitting: Emergency Medicine

## 2024-06-25 NOTE — Telephone Encounter (Signed)
 Called patient due to left emergency department before provider exam to inquire about condition and follow up plans. Left message.

## 2024-07-15 ENCOUNTER — Encounter (INDEPENDENT_AMBULATORY_CARE_PROVIDER_SITE_OTHER): Admitting: Nurse Practitioner

## 2024-08-13 ENCOUNTER — Ambulatory Visit: Admitting: Neuroradiology

## 2024-10-08 ENCOUNTER — Other Ambulatory Visit: Payer: Self-pay

## 2024-10-08 DIAGNOSIS — R0989 Other specified symptoms and signs involving the circulatory and respiratory systems: Secondary | ICD-10-CM

## 2024-10-08 DIAGNOSIS — M79605 Pain in left leg: Secondary | ICD-10-CM

## 2024-10-08 DIAGNOSIS — Z1231 Encounter for screening mammogram for malignant neoplasm of breast: Secondary | ICD-10-CM

## 2025-01-19 ENCOUNTER — Ambulatory Visit: Admit: 2025-01-19 | Admitting: Gastroenterology
